# Patient Record
Sex: Female | Born: 1940 | Race: Black or African American | Hispanic: No | Marital: Married | Smoking: Current every day smoker
Health system: Southern US, Community
[De-identification: ages and names within clinical notes are randomized; demographics above are authoritative.]

## PROBLEM LIST (undated history)

## (undated) DIAGNOSIS — I2510 Atherosclerotic heart disease of native coronary artery without angina pectoris: Secondary | ICD-10-CM

## (undated) DIAGNOSIS — F32A Depression, unspecified: Secondary | ICD-10-CM

## (undated) DIAGNOSIS — F1010 Alcohol abuse, uncomplicated: Secondary | ICD-10-CM

## (undated) DIAGNOSIS — I219 Acute myocardial infarction, unspecified: Secondary | ICD-10-CM

## (undated) DIAGNOSIS — M1990 Unspecified osteoarthritis, unspecified site: Secondary | ICD-10-CM

## (undated) DIAGNOSIS — Z9289 Personal history of other medical treatment: Secondary | ICD-10-CM

## (undated) DIAGNOSIS — I469 Cardiac arrest, cause unspecified: Secondary | ICD-10-CM

## (undated) DIAGNOSIS — E785 Hyperlipidemia, unspecified: Secondary | ICD-10-CM

## (undated) DIAGNOSIS — F329 Major depressive disorder, single episode, unspecified: Secondary | ICD-10-CM

## (undated) DIAGNOSIS — I4901 Ventricular fibrillation: Secondary | ICD-10-CM

## (undated) DIAGNOSIS — Z720 Tobacco use: Secondary | ICD-10-CM

## (undated) DIAGNOSIS — I10 Essential (primary) hypertension: Secondary | ICD-10-CM

## (undated) DIAGNOSIS — F419 Anxiety disorder, unspecified: Secondary | ICD-10-CM

## (undated) DIAGNOSIS — I480 Paroxysmal atrial fibrillation: Secondary | ICD-10-CM

## (undated) DIAGNOSIS — G253 Myoclonus: Secondary | ICD-10-CM

## (undated) DIAGNOSIS — E039 Hypothyroidism, unspecified: Secondary | ICD-10-CM

## (undated) DIAGNOSIS — J9690 Respiratory failure, unspecified, unspecified whether with hypoxia or hypercapnia: Secondary | ICD-10-CM

## (undated) DIAGNOSIS — J449 Chronic obstructive pulmonary disease, unspecified: Secondary | ICD-10-CM

## (undated) DIAGNOSIS — G931 Anoxic brain damage, not elsewhere classified: Secondary | ICD-10-CM

## (undated) DIAGNOSIS — D649 Anemia, unspecified: Secondary | ICD-10-CM

## (undated) HISTORY — DX: Respiratory failure, unspecified, unspecified whether with hypoxia or hypercapnia: J96.90

## (undated) HISTORY — DX: Anxiety disorder, unspecified: F41.9

## (undated) HISTORY — DX: Major depressive disorder, single episode, unspecified: F32.9

## (undated) HISTORY — PX: ANKLE FRACTURE SURGERY: SHX122

## (undated) HISTORY — DX: Myoclonus: G25.3

## (undated) HISTORY — DX: Alcohol abuse, uncomplicated: F10.10

## (undated) HISTORY — DX: Anoxic brain damage, not elsewhere classified: G93.1

## (undated) HISTORY — DX: Unspecified osteoarthritis, unspecified site: M19.90

## (undated) HISTORY — DX: Hypothyroidism, unspecified: E03.9

## (undated) HISTORY — DX: Depression, unspecified: F32.A

## (undated) HISTORY — DX: Chronic obstructive pulmonary disease, unspecified: J44.9

## (undated) HISTORY — DX: Atherosclerotic heart disease of native coronary artery without angina pectoris: I25.10

## (undated) HISTORY — DX: Personal history of other medical treatment: Z92.89

## (undated) HISTORY — DX: Essential (primary) hypertension: I10

## (undated) HISTORY — DX: Anemia, unspecified: D64.9

## (undated) HISTORY — DX: Hyperlipidemia, unspecified: E78.5

## (undated) HISTORY — DX: Tobacco use: Z72.0

## (undated) HISTORY — DX: Acute myocardial infarction, unspecified: I21.9

---

## 1995-02-03 ENCOUNTER — Encounter (INDEPENDENT_AMBULATORY_CARE_PROVIDER_SITE_OTHER): Payer: Self-pay | Admitting: Internal Medicine

## 1995-02-03 LAB — CONVERTED CEMR LAB
Pap Smear: NORMAL
Pap Smear: NORMAL

## 1997-12-03 ENCOUNTER — Encounter (INDEPENDENT_AMBULATORY_CARE_PROVIDER_SITE_OTHER): Payer: Self-pay | Admitting: Internal Medicine

## 1997-12-03 LAB — CONVERTED CEMR LAB
Pap Smear: ABNORMAL
Pap Smear: ABNORMAL

## 1998-06-04 ENCOUNTER — Encounter (INDEPENDENT_AMBULATORY_CARE_PROVIDER_SITE_OTHER): Payer: Self-pay | Admitting: Internal Medicine

## 1998-06-04 LAB — CONVERTED CEMR LAB
Pap Smear: NORMAL
Pap Smear: NORMAL

## 1999-07-06 DIAGNOSIS — Z9289 Personal history of other medical treatment: Secondary | ICD-10-CM

## 1999-07-06 HISTORY — DX: Personal history of other medical treatment: Z92.89

## 1999-07-27 ENCOUNTER — Encounter: Payer: Self-pay | Admitting: Family Medicine

## 1999-07-27 ENCOUNTER — Encounter: Admission: RE | Admit: 1999-07-27 | Discharge: 1999-07-27 | Payer: Self-pay | Admitting: Family Medicine

## 1999-08-02 ENCOUNTER — Other Ambulatory Visit: Admission: RE | Admit: 1999-08-02 | Discharge: 1999-08-02 | Payer: Self-pay | Admitting: *Deleted

## 1999-08-05 HISTORY — PX: OTHER SURGICAL HISTORY: SHX169

## 2000-07-05 ENCOUNTER — Encounter: Admission: RE | Admit: 2000-07-05 | Discharge: 2000-07-05 | Payer: Self-pay | Admitting: Family Medicine

## 2000-07-05 ENCOUNTER — Encounter: Payer: Self-pay | Admitting: Family Medicine

## 2000-08-04 ENCOUNTER — Encounter (INDEPENDENT_AMBULATORY_CARE_PROVIDER_SITE_OTHER): Payer: Self-pay | Admitting: Internal Medicine

## 2000-08-04 LAB — CONVERTED CEMR LAB: Hgb A1c MFr Bld: 6 %

## 2000-09-03 ENCOUNTER — Other Ambulatory Visit: Admission: RE | Admit: 2000-09-03 | Discharge: 2000-09-03 | Payer: Self-pay | Admitting: Family Medicine

## 2000-09-04 ENCOUNTER — Encounter (INDEPENDENT_AMBULATORY_CARE_PROVIDER_SITE_OTHER): Payer: Self-pay | Admitting: Internal Medicine

## 2000-09-04 LAB — CONVERTED CEMR LAB: Pap Smear: NORMAL

## 2000-09-05 ENCOUNTER — Encounter (INDEPENDENT_AMBULATORY_CARE_PROVIDER_SITE_OTHER): Payer: Self-pay | Admitting: Internal Medicine

## 2000-09-05 LAB — CONVERTED CEMR LAB: Pap Smear: NORMAL

## 2002-02-02 ENCOUNTER — Encounter (INDEPENDENT_AMBULATORY_CARE_PROVIDER_SITE_OTHER): Payer: Self-pay | Admitting: Internal Medicine

## 2002-02-02 LAB — CONVERTED CEMR LAB
Hgb A1c MFr Bld: 6.9 %
Microalbumin U total vol: 1.7 mg/L

## 2002-06-04 ENCOUNTER — Encounter (INDEPENDENT_AMBULATORY_CARE_PROVIDER_SITE_OTHER): Payer: Self-pay | Admitting: Internal Medicine

## 2002-06-04 LAB — CONVERTED CEMR LAB: Hgb A1c MFr Bld: 7.3 %

## 2002-09-04 ENCOUNTER — Encounter (INDEPENDENT_AMBULATORY_CARE_PROVIDER_SITE_OTHER): Payer: Self-pay | Admitting: Internal Medicine

## 2002-09-04 LAB — CONVERTED CEMR LAB: Hgb A1c MFr Bld: 7.8 %

## 2002-11-03 ENCOUNTER — Encounter (INDEPENDENT_AMBULATORY_CARE_PROVIDER_SITE_OTHER): Payer: Self-pay | Admitting: Internal Medicine

## 2002-11-03 LAB — CONVERTED CEMR LAB: Hgb A1c MFr Bld: 7.2 %

## 2003-04-05 ENCOUNTER — Encounter (INDEPENDENT_AMBULATORY_CARE_PROVIDER_SITE_OTHER): Payer: Self-pay | Admitting: Internal Medicine

## 2003-04-05 LAB — CONVERTED CEMR LAB: Hgb A1c MFr Bld: 6.7 %

## 2004-05-05 ENCOUNTER — Encounter (INDEPENDENT_AMBULATORY_CARE_PROVIDER_SITE_OTHER): Payer: Self-pay | Admitting: Internal Medicine

## 2004-05-05 LAB — CONVERTED CEMR LAB
Hgb A1c MFr Bld: 7.1 %
Microalbumin U total vol: 1.2 mg/L

## 2004-07-06 ENCOUNTER — Ambulatory Visit: Payer: Self-pay | Admitting: Internal Medicine

## 2004-08-04 ENCOUNTER — Encounter (INDEPENDENT_AMBULATORY_CARE_PROVIDER_SITE_OTHER): Payer: Self-pay | Admitting: Internal Medicine

## 2004-08-04 LAB — CONVERTED CEMR LAB: Hgb A1c MFr Bld: 6.9 %

## 2004-08-09 ENCOUNTER — Ambulatory Visit: Payer: Self-pay | Admitting: Family Medicine

## 2004-09-13 ENCOUNTER — Ambulatory Visit: Payer: Self-pay | Admitting: Family Medicine

## 2005-02-02 ENCOUNTER — Encounter (INDEPENDENT_AMBULATORY_CARE_PROVIDER_SITE_OTHER): Payer: Self-pay | Admitting: Internal Medicine

## 2005-02-02 LAB — CONVERTED CEMR LAB
Hgb A1c MFr Bld: 6.2 %
Microalbumin U total vol: 1.2 mg/L

## 2005-03-02 ENCOUNTER — Ambulatory Visit: Payer: Self-pay | Admitting: Family Medicine

## 2005-04-07 ENCOUNTER — Ambulatory Visit: Payer: Self-pay | Admitting: Family Medicine

## 2005-05-19 ENCOUNTER — Ambulatory Visit: Payer: Self-pay | Admitting: Family Medicine

## 2005-06-04 ENCOUNTER — Encounter (INDEPENDENT_AMBULATORY_CARE_PROVIDER_SITE_OTHER): Payer: Self-pay | Admitting: Internal Medicine

## 2005-06-04 LAB — CONVERTED CEMR LAB: Hgb A1c MFr Bld: 7.3 %

## 2005-06-15 ENCOUNTER — Ambulatory Visit: Payer: Self-pay | Admitting: Internal Medicine

## 2005-06-15 ENCOUNTER — Ambulatory Visit: Payer: Self-pay | Admitting: Cardiology

## 2005-06-15 ENCOUNTER — Encounter: Payer: Self-pay | Admitting: Cardiology

## 2005-06-15 ENCOUNTER — Inpatient Hospital Stay (HOSPITAL_COMMUNITY): Admission: EM | Admit: 2005-06-15 | Discharge: 2005-06-17 | Payer: Self-pay | Admitting: Emergency Medicine

## 2005-06-16 HISTORY — PX: CARDIAC CATHETERIZATION: SHX172

## 2005-06-20 ENCOUNTER — Ambulatory Visit: Payer: Self-pay | Admitting: Family Medicine

## 2005-06-26 ENCOUNTER — Ambulatory Visit: Payer: Self-pay | Admitting: Cardiology

## 2005-06-27 ENCOUNTER — Emergency Department (HOSPITAL_COMMUNITY): Admission: EM | Admit: 2005-06-27 | Discharge: 2005-06-27 | Payer: Self-pay | Admitting: Emergency Medicine

## 2005-06-28 ENCOUNTER — Ambulatory Visit: Payer: Self-pay | Admitting: Family Medicine

## 2005-07-24 ENCOUNTER — Ambulatory Visit: Payer: Self-pay | Admitting: Family Medicine

## 2005-07-27 ENCOUNTER — Emergency Department (HOSPITAL_COMMUNITY): Admission: EM | Admit: 2005-07-27 | Discharge: 2005-07-27 | Payer: Self-pay | Admitting: Emergency Medicine

## 2005-09-16 ENCOUNTER — Ambulatory Visit: Payer: Self-pay | Admitting: Cardiology

## 2005-09-17 ENCOUNTER — Inpatient Hospital Stay (HOSPITAL_COMMUNITY): Admission: EM | Admit: 2005-09-17 | Discharge: 2005-09-18 | Payer: Self-pay | Admitting: Emergency Medicine

## 2005-09-17 ENCOUNTER — Ambulatory Visit: Payer: Self-pay | Admitting: Internal Medicine

## 2005-10-13 ENCOUNTER — Emergency Department (HOSPITAL_COMMUNITY): Admission: EM | Admit: 2005-10-13 | Discharge: 2005-10-13 | Payer: Self-pay | Admitting: Emergency Medicine

## 2005-10-19 ENCOUNTER — Ambulatory Visit: Payer: Self-pay | Admitting: Cardiology

## 2005-10-24 ENCOUNTER — Ambulatory Visit: Payer: Self-pay | Admitting: Family Medicine

## 2005-11-15 ENCOUNTER — Ambulatory Visit: Payer: Self-pay | Admitting: Family Medicine

## 2005-12-03 ENCOUNTER — Encounter (INDEPENDENT_AMBULATORY_CARE_PROVIDER_SITE_OTHER): Payer: Self-pay | Admitting: Internal Medicine

## 2005-12-03 LAB — CONVERTED CEMR LAB: Hgb A1c MFr Bld: 6.4 %

## 2005-12-06 ENCOUNTER — Encounter (INDEPENDENT_AMBULATORY_CARE_PROVIDER_SITE_OTHER): Payer: Self-pay | Admitting: Internal Medicine

## 2005-12-06 ENCOUNTER — Ambulatory Visit: Payer: Self-pay | Admitting: Family Medicine

## 2005-12-06 LAB — CONVERTED CEMR LAB: Hgb A1c MFr Bld: 6.4 %

## 2005-12-13 ENCOUNTER — Ambulatory Visit: Payer: Self-pay | Admitting: Family Medicine

## 2006-01-12 ENCOUNTER — Ambulatory Visit: Payer: Self-pay | Admitting: Family Medicine

## 2006-01-22 ENCOUNTER — Inpatient Hospital Stay (HOSPITAL_COMMUNITY): Admission: EM | Admit: 2006-01-22 | Discharge: 2006-01-23 | Payer: Self-pay | Admitting: Emergency Medicine

## 2006-01-22 ENCOUNTER — Ambulatory Visit: Payer: Self-pay | Admitting: Cardiology

## 2006-02-02 ENCOUNTER — Ambulatory Visit: Payer: Self-pay | Admitting: Family Medicine

## 2006-02-06 ENCOUNTER — Ambulatory Visit: Payer: Self-pay | Admitting: Family Medicine

## 2006-02-21 ENCOUNTER — Ambulatory Visit: Payer: Self-pay | Admitting: Family Medicine

## 2006-02-22 ENCOUNTER — Emergency Department (HOSPITAL_COMMUNITY): Admission: EM | Admit: 2006-02-22 | Discharge: 2006-02-23 | Payer: Self-pay | Admitting: *Deleted

## 2006-02-23 ENCOUNTER — Ambulatory Visit: Payer: Self-pay | Admitting: *Deleted

## 2006-03-14 ENCOUNTER — Ambulatory Visit (HOSPITAL_BASED_OUTPATIENT_CLINIC_OR_DEPARTMENT_OTHER): Admission: RE | Admit: 2006-03-14 | Discharge: 2006-03-14 | Payer: Self-pay | Admitting: Surgery

## 2006-03-16 ENCOUNTER — Ambulatory Visit: Payer: Self-pay | Admitting: Cardiology

## 2006-03-19 ENCOUNTER — Ambulatory Visit: Payer: Self-pay | Admitting: Family Medicine

## 2006-03-21 ENCOUNTER — Ambulatory Visit: Payer: Self-pay | Admitting: Family Medicine

## 2006-04-02 ENCOUNTER — Ambulatory Visit (HOSPITAL_BASED_OUTPATIENT_CLINIC_OR_DEPARTMENT_OTHER): Admission: RE | Admit: 2006-04-02 | Discharge: 2006-04-02 | Payer: Self-pay | Admitting: Surgery

## 2006-04-02 ENCOUNTER — Encounter (INDEPENDENT_AMBULATORY_CARE_PROVIDER_SITE_OTHER): Payer: Self-pay | Admitting: *Deleted

## 2006-05-21 ENCOUNTER — Ambulatory Visit: Payer: Self-pay | Admitting: Family Medicine

## 2006-06-04 ENCOUNTER — Encounter (INDEPENDENT_AMBULATORY_CARE_PROVIDER_SITE_OTHER): Payer: Self-pay | Admitting: Internal Medicine

## 2006-06-04 LAB — CONVERTED CEMR LAB: Hgb A1c MFr Bld: 6.8 %

## 2006-06-20 ENCOUNTER — Ambulatory Visit: Payer: Self-pay | Admitting: General Practice

## 2006-06-25 ENCOUNTER — Encounter (INDEPENDENT_AMBULATORY_CARE_PROVIDER_SITE_OTHER): Payer: Self-pay | Admitting: Internal Medicine

## 2006-06-25 ENCOUNTER — Ambulatory Visit: Payer: Self-pay | Admitting: Family Medicine

## 2006-06-25 LAB — CONVERTED CEMR LAB: Hgb A1c MFr Bld: 6.8 %

## 2006-06-27 ENCOUNTER — Ambulatory Visit: Payer: Self-pay | Admitting: Family Medicine

## 2006-07-18 ENCOUNTER — Ambulatory Visit: Payer: Self-pay | Admitting: Family Medicine

## 2006-07-31 ENCOUNTER — Ambulatory Visit: Payer: Self-pay | Admitting: Family Medicine

## 2006-08-20 ENCOUNTER — Ambulatory Visit: Payer: Self-pay | Admitting: Family Medicine

## 2006-08-21 ENCOUNTER — Ambulatory Visit: Payer: Self-pay | Admitting: Cardiology

## 2006-09-04 ENCOUNTER — Encounter (INDEPENDENT_AMBULATORY_CARE_PROVIDER_SITE_OTHER): Payer: Self-pay | Admitting: Internal Medicine

## 2006-09-04 LAB — CONVERTED CEMR LAB: Hgb A1c MFr Bld: 6.5 %

## 2006-09-21 ENCOUNTER — Ambulatory Visit: Payer: Self-pay | Admitting: Family Medicine

## 2006-09-21 ENCOUNTER — Encounter (INDEPENDENT_AMBULATORY_CARE_PROVIDER_SITE_OTHER): Payer: Self-pay | Admitting: Internal Medicine

## 2006-09-21 LAB — CONVERTED CEMR LAB
ALT: 14 units/L (ref 0–40)
AST: 16 units/L (ref 0–37)
BUN: 10 mg/dL (ref 6–23)
CO2: 33 meq/L — ABNORMAL HIGH (ref 19–32)
Calcium: 9.8 mg/dL (ref 8.4–10.5)
Chloride: 100 meq/L (ref 96–112)
Cholesterol: 226 mg/dL (ref 0–200)
Creatinine, Ser: 0.8 mg/dL (ref 0.4–1.2)
Creatinine,U: 109.5 mg/dL
Direct LDL: 132 mg/dL
GFR calc Af Amer: 92 mL/min
GFR calc non Af Amer: 76 mL/min
Glucose, Bld: 98 mg/dL (ref 70–99)
HDL: 59 mg/dL (ref >39.0)
Hgb A1c MFr Bld: 6.5 %
Hgb A1c MFr Bld: 6.5 % — ABNORMAL HIGH (ref 4.6–6.0)
Microalb Creat Ratio: 491.3 mg/g — ABNORMAL HIGH (ref 0.0–30.0)
Microalb, Ur: 53.8 mg/dL (ref 0.0–1.9)
Microalbumin U total vol: 53.8 mg/L
Microalbumin U total vol: 53.8 mg/L
Potassium: 3.6 meq/L (ref 3.5–5.1)
Sodium: 140 meq/L (ref 135–145)
TSH: 0.99 microintl units/mL (ref 0.35–5.50)
Total CHOL/HDL Ratio: 3.8
Triglycerides: 131 mg/dL (ref 0–149)
VLDL: 26 mg/dL (ref 0–40)

## 2006-09-27 ENCOUNTER — Ambulatory Visit: Payer: Self-pay | Admitting: Family Medicine

## 2006-10-25 ENCOUNTER — Encounter: Admission: RE | Admit: 2006-10-25 | Discharge: 2006-10-25 | Payer: Self-pay | Admitting: Nephrology

## 2006-10-31 ENCOUNTER — Encounter: Admission: RE | Admit: 2006-10-31 | Discharge: 2006-10-31 | Payer: Self-pay | Admitting: Nephrology

## 2006-11-27 ENCOUNTER — Encounter (INDEPENDENT_AMBULATORY_CARE_PROVIDER_SITE_OTHER): Payer: Self-pay | Admitting: Internal Medicine

## 2006-11-27 DIAGNOSIS — J449 Chronic obstructive pulmonary disease, unspecified: Secondary | ICD-10-CM

## 2006-11-27 DIAGNOSIS — E785 Hyperlipidemia, unspecified: Secondary | ICD-10-CM

## 2006-11-27 DIAGNOSIS — E039 Hypothyroidism, unspecified: Secondary | ICD-10-CM

## 2006-11-27 DIAGNOSIS — M1990 Unspecified osteoarthritis, unspecified site: Secondary | ICD-10-CM | POA: Insufficient documentation

## 2006-11-27 DIAGNOSIS — J4489 Other specified chronic obstructive pulmonary disease: Secondary | ICD-10-CM | POA: Insufficient documentation

## 2007-01-09 ENCOUNTER — Ambulatory Visit: Payer: Self-pay | Admitting: Family Medicine

## 2007-01-09 DIAGNOSIS — F329 Major depressive disorder, single episode, unspecified: Secondary | ICD-10-CM

## 2007-01-09 DIAGNOSIS — I10 Essential (primary) hypertension: Secondary | ICD-10-CM

## 2007-01-09 DIAGNOSIS — F419 Anxiety disorder, unspecified: Secondary | ICD-10-CM | POA: Insufficient documentation

## 2007-01-09 DIAGNOSIS — E118 Type 2 diabetes mellitus with unspecified complications: Secondary | ICD-10-CM

## 2007-01-18 ENCOUNTER — Telehealth (INDEPENDENT_AMBULATORY_CARE_PROVIDER_SITE_OTHER): Payer: Self-pay | Admitting: Internal Medicine

## 2007-03-14 ENCOUNTER — Encounter: Payer: Self-pay | Admitting: Internal Medicine

## 2007-03-14 ENCOUNTER — Ambulatory Visit: Payer: Self-pay | Admitting: Internal Medicine

## 2007-03-14 LAB — CONVERTED CEMR LAB
ALT: 14 units/L (ref 0–35)
AST: 17 units/L (ref 0–37)
Albumin: 3.6 g/dL (ref 3.5–5.2)
Alkaline Phosphatase: 56 units/L (ref 39–117)
BUN: 10 mg/dL (ref 6–23)
Basophils Absolute: 0 10*3/uL (ref 0.0–0.1)
Basophils Relative: 0.3 % (ref 0.0–1.0)
Bilirubin, Direct: 0.2 mg/dL (ref 0.0–0.3)
CO2: 29 meq/L (ref 19–32)
Calcium: 9.6 mg/dL (ref 8.4–10.5)
Chloride: 98 meq/L (ref 96–112)
Cholesterol, target level: 200 mg/dL
Cholesterol: 239 mg/dL (ref 0–200)
Creatinine, Ser: 0.7 mg/dL (ref 0.4–1.2)
Direct LDL: 141.3 mg/dL
Eosinophils Absolute: 0.1 10*3/uL (ref 0.0–0.6)
Eosinophils Relative: 2.1 % (ref 0.0–5.0)
GFR calc Af Amer: 107 mL/min
GFR calc non Af Amer: 89 mL/min
Glucose, Bld: 90 mg/dL (ref 70–99)
HCT: 39.8 % (ref 36.0–46.0)
HDL goal, serum: 40 mg/dL
HDL: 65.3 mg/dL (ref >39.0)
Hemoglobin: 13.6 g/dL (ref 12.0–15.0)
Hgb A1c MFr Bld: 6.4 % — ABNORMAL HIGH (ref 4.6–6.0)
LDL Goal: 100 mg/dL
Lymphocytes Relative: 44 % (ref 12.0–46.0)
MCHC: 34.1 g/dL (ref 30.0–36.0)
MCV: 88.4 fL (ref 78.0–100.0)
Monocytes Absolute: 0.4 10*3/uL (ref 0.2–0.7)
Monocytes Relative: 7.4 % (ref 3.0–11.0)
Neutro Abs: 2.8 10*3/uL (ref 1.4–7.7)
Neutrophils Relative %: 46.2 % (ref 43.0–77.0)
Platelets: 207 10*3/uL (ref 150–400)
Potassium: 4.5 meq/L (ref 3.5–5.1)
RBC: 4.5 M/uL (ref 3.87–5.11)
RDW: 13.8 % (ref 11.5–14.6)
Sodium: 136 meq/L (ref 135–145)
TSH: 0.36 microintl units/mL (ref 0.35–5.50)
Total Bilirubin: 1 mg/dL (ref 0.3–1.2)
Total CHOL/HDL Ratio: 3.7
Total Protein: 6 g/dL (ref 6.0–8.3)
Triglycerides: 131 mg/dL (ref 0–149)
VLDL: 26 mg/dL (ref 0–40)
WBC: 5.9 10*3/uL (ref 4.5–10.5)

## 2007-04-26 ENCOUNTER — Ambulatory Visit: Payer: Self-pay | Admitting: Internal Medicine

## 2007-04-26 DIAGNOSIS — F17200 Nicotine dependence, unspecified, uncomplicated: Secondary | ICD-10-CM | POA: Insufficient documentation

## 2007-04-29 ENCOUNTER — Telehealth: Payer: Self-pay | Admitting: Internal Medicine

## 2007-05-13 ENCOUNTER — Encounter (INDEPENDENT_AMBULATORY_CARE_PROVIDER_SITE_OTHER): Payer: Self-pay | Admitting: *Deleted

## 2007-06-13 ENCOUNTER — Encounter: Payer: Self-pay | Admitting: Internal Medicine

## 2007-06-21 ENCOUNTER — Ambulatory Visit: Payer: Self-pay | Admitting: Internal Medicine

## 2007-06-21 DIAGNOSIS — F1010 Alcohol abuse, uncomplicated: Secondary | ICD-10-CM | POA: Insufficient documentation

## 2007-06-26 ENCOUNTER — Ambulatory Visit: Payer: Self-pay | Admitting: General Practice

## 2007-07-22 ENCOUNTER — Ambulatory Visit: Payer: Self-pay | Admitting: Internal Medicine

## 2007-07-22 LAB — CONVERTED CEMR LAB
ALT: 17 units/L (ref 0–35)
AST: 17 units/L (ref 0–37)
Albumin: 3.9 g/dL (ref 3.5–5.2)
Alkaline Phosphatase: 58 units/L (ref 39–117)
BUN: 14 mg/dL (ref 6–23)
Bilirubin, Direct: 0.2 mg/dL (ref 0.0–0.3)
CO2: 30 meq/L (ref 19–32)
Calcium: 10.1 mg/dL (ref 8.4–10.5)
Chloride: 99 meq/L (ref 96–112)
Creatinine, Ser: 0.9 mg/dL (ref 0.4–1.2)
GFR calc Af Amer: 80 mL/min
GFR calc non Af Amer: 66 mL/min
Glucose, Bld: 129 mg/dL — ABNORMAL HIGH (ref 70–99)
Hgb A1c MFr Bld: 6.4 % — ABNORMAL HIGH (ref 4.6–6.0)
Potassium: 3.8 meq/L (ref 3.5–5.1)
Sodium: 138 meq/L (ref 135–145)
TSH: 0.5 microintl units/mL (ref 0.35–5.50)
Total Bilirubin: 1.3 mg/dL — ABNORMAL HIGH (ref 0.3–1.2)
Total Protein: 6.6 g/dL (ref 6.0–8.3)

## 2007-07-26 ENCOUNTER — Ambulatory Visit: Payer: Self-pay | Admitting: Internal Medicine

## 2007-08-14 ENCOUNTER — Ambulatory Visit: Payer: Self-pay | Admitting: Internal Medicine

## 2007-09-25 ENCOUNTER — Encounter: Payer: Self-pay | Admitting: Internal Medicine

## 2007-09-25 ENCOUNTER — Ambulatory Visit: Payer: Self-pay | Admitting: Internal Medicine

## 2007-09-26 LAB — HM COLONOSCOPY

## 2007-12-09 ENCOUNTER — Ambulatory Visit: Payer: Self-pay | Admitting: Internal Medicine

## 2007-12-11 LAB — CONVERTED CEMR LAB
ALT: 15 units/L (ref 0–35)
AST: 19 units/L (ref 0–37)
BUN: 16 mg/dL (ref 6–23)
CO2: 29 meq/L (ref 19–32)
Calcium: 9.6 mg/dL (ref 8.4–10.5)
Chloride: 101 meq/L (ref 96–112)
Cholesterol: 229 mg/dL (ref 0–200)
Creatinine, Ser: 0.9 mg/dL (ref 0.4–1.2)
Direct LDL: 153.7 mg/dL
GFR calc Af Amer: 80 mL/min
GFR calc non Af Amer: 66 mL/min
Glucose, Bld: 163 mg/dL — ABNORMAL HIGH (ref 70–99)
HDL: 62.8 mg/dL (ref >39.0)
Hgb A1c MFr Bld: 6.3 % — ABNORMAL HIGH (ref 4.6–6.0)
Potassium: 3.6 meq/L (ref 3.5–5.1)
Sodium: 136 meq/L (ref 135–145)
TSH: 0.67 microintl units/mL (ref 0.35–5.50)
Total CHOL/HDL Ratio: 3.6
Triglycerides: 96 mg/dL (ref 0–149)
VLDL: 19 mg/dL (ref 0–40)

## 2007-12-17 ENCOUNTER — Ambulatory Visit: Payer: Self-pay | Admitting: Internal Medicine

## 2008-01-31 ENCOUNTER — Ambulatory Visit: Payer: Self-pay | Admitting: Internal Medicine

## 2008-02-03 ENCOUNTER — Telehealth (INDEPENDENT_AMBULATORY_CARE_PROVIDER_SITE_OTHER): Payer: Self-pay | Admitting: *Deleted

## 2008-02-03 ENCOUNTER — Encounter: Payer: Self-pay | Admitting: Internal Medicine

## 2008-03-10 ENCOUNTER — Ambulatory Visit: Payer: Self-pay | Admitting: Internal Medicine

## 2008-03-10 LAB — CONVERTED CEMR LAB
ALT: 22 units/L (ref 0–35)
AST: 23 units/L (ref 0–37)
Albumin: 3.7 g/dL (ref 3.5–5.2)
Alkaline Phosphatase: 57 units/L (ref 39–117)
BUN: 15 mg/dL (ref 6–23)
Bilirubin, Direct: 0.1 mg/dL (ref 0.0–0.3)
CO2: 27 meq/L (ref 19–32)
Calcium: 9.6 mg/dL (ref 8.4–10.5)
Chloride: 98 meq/L (ref 96–112)
Cholesterol: 173 mg/dL (ref 0–200)
Creatinine, Ser: 0.9 mg/dL (ref 0.4–1.2)
GFR calc Af Amer: 80 mL/min
GFR calc non Af Amer: 66 mL/min
Glucose, Bld: 125 mg/dL — ABNORMAL HIGH (ref 70–99)
HDL: 63.6 mg/dL (ref >39.0)
Hgb A1c MFr Bld: 6.6 % — ABNORMAL HIGH (ref 4.6–6.0)
LDL Cholesterol: 81 mg/dL (ref 0–99)
Potassium: 3.6 meq/L (ref 3.5–5.1)
Sodium: 134 meq/L — ABNORMAL LOW (ref 135–145)
TSH: 1.01 microintl units/mL (ref 0.35–5.50)
Total Bilirubin: 0.8 mg/dL (ref 0.3–1.2)
Total CHOL/HDL Ratio: 2.7
Total Protein: 6.7 g/dL (ref 6.0–8.3)
Triglycerides: 144 mg/dL (ref 0–149)
VLDL: 29 mg/dL (ref 0–40)

## 2008-03-17 ENCOUNTER — Ambulatory Visit: Payer: Self-pay | Admitting: Internal Medicine

## 2008-03-31 ENCOUNTER — Encounter: Payer: Self-pay | Admitting: Internal Medicine

## 2008-05-29 ENCOUNTER — Encounter: Payer: Self-pay | Admitting: Internal Medicine

## 2008-06-27 LAB — HM MAMMOGRAPHY

## 2008-08-03 ENCOUNTER — Ambulatory Visit: Payer: Self-pay | Admitting: Internal Medicine

## 2008-08-03 LAB — CONVERTED CEMR LAB
ALT: 23 units/L (ref 0–35)
AST: 20 units/L (ref 0–37)
Albumin: 3.5 g/dL (ref 3.5–5.2)
Alkaline Phosphatase: 52 units/L (ref 39–117)
BUN: 15 mg/dL (ref 6–23)
Bilirubin, Direct: 0.1 mg/dL (ref 0.0–0.3)
CO2: 31 meq/L (ref 19–32)
Calcium: 9.6 mg/dL (ref 8.4–10.5)
Chloride: 106 meq/L (ref 96–112)
Cholesterol: 159 mg/dL (ref 0–200)
Creatinine, Ser: 0.7 mg/dL (ref 0.4–1.2)
GFR calc Af Amer: 107 mL/min
GFR calc non Af Amer: 88 mL/min
Glucose, Bld: 97 mg/dL (ref 70–99)
HDL: 61.7 mg/dL (ref >39.0)
Hgb A1c MFr Bld: 6.7 % — ABNORMAL HIGH (ref 4.6–6.0)
LDL Cholesterol: 67 mg/dL (ref 0–99)
Potassium: 3.6 meq/L (ref 3.5–5.1)
Sodium: 142 meq/L (ref 135–145)
Total Bilirubin: 0.7 mg/dL (ref 0.3–1.2)
Total CHOL/HDL Ratio: 2.6
Total Protein: 6.4 g/dL (ref 6.0–8.3)
Triglycerides: 152 mg/dL — ABNORMAL HIGH (ref 0–149)
VLDL: 30 mg/dL (ref 0–40)

## 2008-08-06 ENCOUNTER — Ambulatory Visit: Payer: Self-pay | Admitting: Internal Medicine

## 2008-12-10 ENCOUNTER — Ambulatory Visit: Payer: Self-pay | Admitting: Internal Medicine

## 2008-12-10 LAB — CONVERTED CEMR LAB
ALT: 16 units/L (ref 0–35)
AST: 19 units/L (ref 0–37)
Albumin: 3.4 g/dL — ABNORMAL LOW (ref 3.5–5.2)
Calcium: 9.3 mg/dL (ref 8.4–10.5)
GFR calc non Af Amer: 91.28 mL/min (ref >60)
Glucose, Bld: 124 mg/dL — ABNORMAL HIGH (ref 70–99)
HDL: 56.8 mg/dL (ref >39.00)
Sodium: 139 meq/L (ref 135–145)
Triglycerides: 88 mg/dL (ref 0.0–149.0)

## 2008-12-30 ENCOUNTER — Encounter: Payer: Self-pay | Admitting: Internal Medicine

## 2009-01-05 ENCOUNTER — Ambulatory Visit: Payer: Self-pay | Admitting: Internal Medicine

## 2009-01-14 ENCOUNTER — Ambulatory Visit: Payer: Self-pay | Admitting: Cardiology

## 2009-03-12 ENCOUNTER — Ambulatory Visit: Payer: Self-pay | Admitting: Cardiology

## 2009-03-12 ENCOUNTER — Inpatient Hospital Stay (HOSPITAL_COMMUNITY): Admission: EM | Admit: 2009-03-12 | Discharge: 2009-03-14 | Payer: Self-pay | Admitting: Emergency Medicine

## 2009-03-22 ENCOUNTER — Ambulatory Visit: Payer: Self-pay | Admitting: Cardiology

## 2009-03-24 LAB — CONVERTED CEMR LAB
BUN: 19 mg/dL (ref 6–23)
Calcium: 9.5 mg/dL (ref 8.4–10.5)
Potassium: 3.7 meq/L (ref 3.5–5.1)
Sodium: 139 meq/L (ref 135–145)

## 2009-04-02 ENCOUNTER — Ambulatory Visit: Payer: Self-pay | Admitting: Cardiology

## 2009-04-02 ENCOUNTER — Encounter: Payer: Self-pay | Admitting: Nurse Practitioner

## 2009-04-02 DIAGNOSIS — I4819 Other persistent atrial fibrillation: Secondary | ICD-10-CM

## 2009-05-06 ENCOUNTER — Ambulatory Visit: Payer: Self-pay | Admitting: Internal Medicine

## 2009-05-12 LAB — CONVERTED CEMR LAB
CO2: 30 meq/L (ref 19–32)
Calcium: 9.7 mg/dL (ref 8.4–10.5)
Cholesterol: 166 mg/dL (ref 0–200)
HDL: 63.1 mg/dL (ref >39.00)
Hgb A1c MFr Bld: 7 % — ABNORMAL HIGH (ref 4.6–6.5)
Sodium: 139 meq/L (ref 135–145)
Total CHOL/HDL Ratio: 3
Triglycerides: 90 mg/dL (ref 0.0–149.0)

## 2009-06-09 ENCOUNTER — Ambulatory Visit: Payer: Self-pay | Admitting: Cardiology

## 2009-09-17 ENCOUNTER — Ambulatory Visit: Payer: Self-pay | Admitting: Internal Medicine

## 2009-09-17 LAB — CONVERTED CEMR LAB
BUN: 20 mg/dL (ref 6–23)
Bilirubin, Direct: 0.1 mg/dL (ref 0.0–0.3)
CO2: 19 meq/L (ref 19–32)
Chloride: 100 meq/L (ref 96–112)
Cholesterol: 185 mg/dL (ref 0–200)
Glucose, Bld: 146 mg/dL — ABNORMAL HIGH (ref 70–99)
Hgb A1c MFr Bld: 7.3 % — ABNORMAL HIGH (ref 4.6–6.5)
Indirect Bilirubin: 0.5 mg/dL (ref 0.0–0.9)
LDL Cholesterol: 97 mg/dL (ref 0–99)
Potassium: 3.8 meq/L (ref 3.5–5.3)
Total Bilirubin: 0.6 mg/dL (ref 0.3–1.2)
Total CHOL/HDL Ratio: 2.9
VLDL: 24 mg/dL (ref 0–40)

## 2009-09-18 ENCOUNTER — Encounter: Payer: Self-pay | Admitting: Cardiology

## 2009-09-20 ENCOUNTER — Telehealth: Payer: Self-pay | Admitting: Internal Medicine

## 2009-09-23 ENCOUNTER — Ambulatory Visit: Payer: Self-pay | Admitting: Internal Medicine

## 2009-12-22 ENCOUNTER — Encounter: Payer: Self-pay | Admitting: Internal Medicine

## 2010-01-19 ENCOUNTER — Ambulatory Visit: Payer: Self-pay | Admitting: Internal Medicine

## 2010-01-19 LAB — CONVERTED CEMR LAB
Albumin: 4 g/dL (ref 3.5–5.2)
CO2: 28 meq/L (ref 19–32)
Calcium: 9.8 mg/dL (ref 8.4–10.5)
Chloride: 102 meq/L (ref 96–112)
Cholesterol: 250 mg/dL — ABNORMAL HIGH (ref 0–200)
Creatinine, Ser: 0.9 mg/dL (ref 0.4–1.2)
Direct LDL: 157.6 mg/dL
HDL: 74.9 mg/dL (ref >39.00)
Hgb A1c MFr Bld: 7.4 % — ABNORMAL HIGH (ref 4.6–6.5)
Sodium: 141 meq/L (ref 135–145)
Total CHOL/HDL Ratio: 3
Total Protein: 6.8 g/dL (ref 6.0–8.3)
Triglycerides: 128 mg/dL (ref 0.0–149.0)
VLDL: 25.6 mg/dL (ref 0.0–40.0)

## 2010-01-25 ENCOUNTER — Ambulatory Visit: Payer: Self-pay | Admitting: Internal Medicine

## 2010-03-11 ENCOUNTER — Ambulatory Visit: Payer: Self-pay | Admitting: Cardiology

## 2010-05-17 ENCOUNTER — Ambulatory Visit: Payer: Self-pay | Admitting: Internal Medicine

## 2010-05-17 LAB — CONVERTED CEMR LAB
ALT: 18 units/L (ref 0–35)
AST: 18 units/L (ref 0–37)
Albumin: 3.7 g/dL (ref 3.5–5.2)
BUN: 15 mg/dL (ref 6–23)
Chloride: 104 meq/L (ref 96–112)
Cholesterol: 217 mg/dL — ABNORMAL HIGH (ref 0–200)
Creatinine, Ser: 0.8 mg/dL (ref 0.4–1.2)
Glucose, Bld: 113 mg/dL — ABNORMAL HIGH (ref 70–99)
HDL: 65.6 mg/dL (ref >39.00)
Hgb A1c MFr Bld: 7.3 % — ABNORMAL HIGH (ref 4.6–6.5)
Potassium: 4 meq/L (ref 3.5–5.1)
TSH: 0.48 microintl units/mL (ref 0.35–5.50)
Total Bilirubin: 0.5 mg/dL (ref 0.3–1.2)
VLDL: 13.4 mg/dL (ref 0.0–40.0)

## 2010-05-26 ENCOUNTER — Ambulatory Visit: Payer: Self-pay | Admitting: Internal Medicine

## 2010-09-04 LAB — HM DIABETES EYE EXAM: HM Diabetic Eye Exam: NORMAL

## 2010-09-21 ENCOUNTER — Ambulatory Visit
Admission: RE | Admit: 2010-09-21 | Discharge: 2010-09-21 | Payer: Self-pay | Source: Home / Self Care | Attending: Internal Medicine | Admitting: Internal Medicine

## 2010-09-21 ENCOUNTER — Other Ambulatory Visit: Payer: Self-pay | Admitting: Internal Medicine

## 2010-09-21 LAB — BASIC METABOLIC PANEL
BUN: 12 mg/dL (ref 6–23)
CO2: 26 mEq/L (ref 19–32)
Calcium: 9.5 mg/dL (ref 8.4–10.5)
Chloride: 103 mEq/L (ref 96–112)
Creatinine, Ser: 0.7 mg/dL (ref 0.4–1.2)
GFR: 102.55 mL/min (ref >60.00)
Glucose, Bld: 125 mg/dL — ABNORMAL HIGH (ref 70–99)
Potassium: 3.9 mEq/L (ref 3.5–5.1)
Sodium: 138 mEq/L (ref 135–145)

## 2010-09-21 LAB — HEMOGLOBIN A1C: Hgb A1c MFr Bld: 7.1 % — ABNORMAL HIGH (ref 4.6–6.5)

## 2010-09-21 LAB — LIPID PANEL
Cholesterol: 274 mg/dL — ABNORMAL HIGH (ref 0–200)
HDL: 75.1 mg/dL (ref >39.00)
Total CHOL/HDL Ratio: 4
Triglycerides: 115 mg/dL (ref 0.0–149.0)
VLDL: 23 mg/dL (ref 0.0–40.0)

## 2010-09-21 LAB — LDL CHOLESTEROL, DIRECT: Direct LDL: 185.8 mg/dL

## 2010-09-21 LAB — TSH: TSH: 0.43 u[IU]/mL (ref 0.35–5.50)

## 2010-09-21 LAB — HEPATIC FUNCTION PANEL
ALT: 16 U/L (ref 0–35)
AST: 15 U/L (ref 0–37)
Albumin: 3.6 g/dL (ref 3.5–5.2)
Alkaline Phosphatase: 73 U/L (ref 39–117)
Bilirubin, Direct: 0.1 mg/dL (ref 0.0–0.3)
Total Bilirubin: 0.9 mg/dL (ref 0.3–1.2)
Total Protein: 6.2 g/dL (ref 6.0–8.3)

## 2010-09-26 ENCOUNTER — Ambulatory Visit: Admit: 2010-09-26 | Payer: Self-pay | Admitting: Internal Medicine

## 2010-09-27 ENCOUNTER — Encounter: Payer: Self-pay | Admitting: Internal Medicine

## 2010-10-03 ENCOUNTER — Ambulatory Visit
Admission: RE | Admit: 2010-10-03 | Discharge: 2010-10-03 | Payer: Self-pay | Source: Home / Self Care | Attending: Internal Medicine | Admitting: Internal Medicine

## 2010-10-04 NOTE — Assessment & Plan Note (Signed)
Summary: ROA/FUP/RCD rsc bmp/njr---PT RSC (BMP) // RS   Vital Signs:  Patient profile:   70 year old female Weight:      142.5 pounds BMI:     23.80 Temp:     98.1 degrees F Pulse rate:   56 / minute Resp:     12 per minute BP sitting:   160 / 98  (left arm)  Vitals Entered By: Gladis Riffle, RN (September 23, 2009 8:06 AM)   Primary Care Provider:  Birdie Sons MD   History of Present Illness:  Follow-Up Visit      This is a 70 year old woman who presents for Follow-up visit.  The patient denies chest pain, palpitations, dizziness, syncope, edema, SOB, DOE, PND, and orthopnea.  Since the last visit the patient notes no new problems or concerns.  The patient reports taking meds as prescribed.  When questioned about possible medication side effects, the patient notes none.    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 1& 1/2 Tab Qd 2)  Synthroid 75 Mcg Tabs (Levothyroxine Sodium) .Marland Kitchen.. 1 Qd 3)  Aspirin 81 Mg Tbec (Aspirin) .Marland Kitchen.. 1 To 2 Qd 4)  Actoplus Met 15-850 Mg Tabs (Pioglitazone Hcl-Metformin Hcl) .Marland Kitchen.. 1 Once Daily 5)   Ramipril 10 Mg Caps (Ramipril) .... Take 1 Capsule By Mouth Once A Day 6)  Triamterene-Hctz 37.5-25 Mg  Tabs (Triamterene-Hctz) .... One By Mouth Daily 7)  Cartia Xt 300 Mg  Cp24 (Diltiazem Hcl Coated Beads) .... One By Mouth Daily 8)  Crestor 20 Mg Tabs (Rosuvastatin Calcium) .... Out 9)  Flecainide Acetate 100 Mg Tabs (Flecainide Acetate) .... Take Tree Tablets By Mouth As Needed For Palpitations  Allergies: 1)  * Penicillin  Comments:  Nurse/Medical Assistant: rov, labs done--continues to smoke and not using chantix--needs synthroid Rx  The patient's medications and allergies were reviewed with the patient and were updated in the Medication and Allergy Lists. Gladis Riffle, RN (September 23, 2009 8:07 AM)  Social History: Packs/Day:  0.5  Review of Systems       All other systems reviewed and were negative   Physical Exam  General:  Well developed, well nourished, in no acute distress.  smells of tobacco Head:  normocephalic and atraumatic.   Eyes:  pupils equal and pupils round.   Ears:  R ear normal and L ear normal.   Neck:  supple without bruits or JVD. Chest Wall:  no deformities or breast masses noted Lungs:  Clear bilaterally to auscultation and percussion. Abdomen:  round, soft, nontender,  nondistended, bowel sounds present x4. Msk:  No deformity or scoliosis noted of thoracic or lumbar spine.   Pulses:  R radial normal and L radial normal.   Extremities:  No clubbing, cyanosis, edema, or deformity noted  Neurologic:  cranial nerves II-XII intact and gait normal.   Skin:  turgor normal and color normal.   Psych:  memory intact for recent and remote and good eye contact.     Impression & Recommendations:  Problem # 1:  ATRIAL FIBRILLATION, PAROXYSMAL (ICD-427.31) no recurrence continue current medications  Her updated medication list for this problem includes:    Metoprolol Tartrate 50 Mg Tabs (Metoprolol tartrate) .Marland Kitchen... 1& 1/2 tab qd    Aspirin 81 Mg Tbec  (Aspirin) .Marland Kitchen... 1 to 2 qd    Cartia Xt 300 Mg Cp24 (Diltiazem hcl coated beads) ..... One by mouth daily    Flecainide Acetate 100 Mg Tabs (Flecainide acetate) .Marland Kitchen... Take tree tablets by mouth as needed for palpitations  Problem # 2:  TOBACCO USE (ICD-305.1) she is trying to taper---discussed importance of absolute cessation  Problem # 3:  DIABETES-TYPE 2 (ICD-250.00) fair control discussed ongoing use of TZD Her updated medication list for this problem includes:    Aspirin 81 Mg Tbec (Aspirin) .Marland Kitchen... 1 to 2 qd    Actoplus Met 15-850 Mg Tabs (Pioglitazone hcl-metformin hcl) .Marland Kitchen... 1 once daily    Ramipril 10 Mg Caps (Ramipril) .Marland Kitchen... Take 1 capsule by mouth once a day  Labs Reviewed: Creat: 0.75 (09/17/2009)   Microalbumin: 53.8 (09/21/2006)  Last Eye Exam: normal-pt's report (02/02/2009) Reviewed HgBA1c results: 7.3 (09/17/2009)  7.0 (05/06/2009)  Problem # 4:  HYPOTHYROIDISM (ICD-244.9) continue current medications (she has been out of repla cement for one month---she will resume) Her updated medication list for this problem includes:    Synthroid 75 Mcg Tabs (Levothyroxine sodium) .Marland Kitchen... 1 qd  Labs Reviewed: TSH: 0.44 (05/06/2009)    HgBA1c: 7.3 (09/17/2009) Chol: 185 (09/17/2009)   HDL: 64 (09/17/2009)   LDL: 97 (09/17/2009)   TG: 121 (09/17/2009)  Problem # 5:  HYPERLIPIDEMIA (ICD-272.4) well controleed continue current medications  Her updated medication list for this problem includes:    Crestor 20 Mg Tabs (Rosuvastatin calcium) ..... Out  Labs Reviewed: SGOT: 13 (09/17/2009)   SGPT: 12 (09/17/2009)  Lipid Goals: Chol Goal: 200 (03/14/2007)   HDL Goal: 40 (03/14/2007)   LDL Goal: 100 (03/14/2007)   TG Goal: 150 (03/14/2007)  Prior 10 Yr Risk Heart Disease: Not enough information (03/14/2007)   HDL:64 (09/17/2009), 63.10 (05/06/2009)  LDL:97 (09/17/2009), 85 (05/06/2009)  Chol:185 (09/17/2009), 166 (05/06/2009)  Trig:121 (09/17/2009), 90.0 (05/06/2009)  Complete  Medication List: 1)  Metoprolol Tartrate 50 Mg Tabs (Metoprolol tartrate) .Marland Kitchen.. 1& 1/2 tab qd 2)  Synthroid 75 Mcg Tabs (Levothyroxine sodium) .Marland Kitchen.. 1 qd 3)  Aspirin 81 Mg Tbec (Aspirin) .Marland Kitchen.. 1 to 2 qd 4)  Actoplus Met 15-850 Mg Tabs (Pioglitazone hcl-metformin hcl) .Marland Kitchen.. 1 once daily 5)  Ramipril 10 Mg Caps (Ramipril) .... Take 1 capsule by mouth once a day 6)  Triamterene-hctz 37.5-25 Mg Tabs (Triamterene-hctz) .... One by mouth daily 7)  Cartia Xt 300 Mg Cp24 (Diltiazem hcl coated beads) .... One by mouth daily 8)  Crestor 20 Mg Tabs (Rosuvastatin calcium) .... Out 9)  Flecainide Acetate 100 Mg Tabs (Flecainide acetate) .... Take tree tablets by mouth as needed for palpitations  Patient Instructions: 1)  Please schedule a follow-up appointment in 4 months. 2)  labs one week prior to  visit 3)  lipids---272.4 4)  lfts-995.2 5)  bmet-995.2 6)  A1C-250.02 7)    8)  tsh---244.9 9)  Tobacco is very bad for your health and your loved ones! You Should stop smoking!. 10)  Stop Smoking Tips: Choose a Quit date. Cut down before the Quit date. decide what you will do as a substitute when you feel the urge to smoke(gum,toothpick,exercise).

## 2010-10-04 NOTE — Letter (Signed)
Summary: [facility] Missed Appt Notification  [facility]t [facility]   Imported By: Roderic Ovens 10/05/2009 16:27:09  _____________________________________________________________________  External Attachment:    Type:   Image     Comment:   External Document

## 2010-10-04 NOTE — Progress Notes (Signed)
Summary: REQ FOR REFILL ON MED  Phone Note Call from Patient   Caller: Patient @ [phone] Reason for Call: Refill Medication Action Taken: Patient advised to call 911 Summary of Call: Pt stated that she needs a refill on her thyroid med (Synthroid)...[facility] [address]... [facility] req [facility] but her req for refill would be passed along..... Info only--- Pt has appt on Thursday, Jan. 20, 2011 @ 7:45am with Dr Cato Mulligan.  Initial call taken by: Debbra Riding,  September 20, 2009 4:43 PM    Prescriptions: SYNTHROID 75 MCG TABS (LEVOTHYROXINE SODIUM) 1 QD  #90 Tablet x 2   Entered by:   Willy Eddy, LPN   Authorized by:   Birdie Sons MD   Signed by:   Willy Eddy, LPN on 09/81/1914   Method used:   [facility]  [address] [phone][facility] (retail)       [address]       [place], [place]  [id number]       Ph: [phone] or [phone]       Fax: [phone]   RxID:   [id number]

## 2010-10-04 NOTE — Letter (Signed)
Summary: [facility]   Imported By: Maryln Gottron 01/10/2010 10:24:27  _____________________________________________________________________  External Attachment:    Type:   Image     Comment:   External Document

## 2010-10-04 NOTE — Assessment & Plan Note (Signed)
Summary: 4 mo rov/jlp   Vital Signs:  Patient profile:   70 year old female Weight:      143 pounds BMI:     23.88 Pulse rate:   62 / minute Pulse rhythm:   regular Resp:     12 per minute BP sitting:   148 / 90  (left arm) Cuff size:   regular  Vitals Entered By: Gladis Riffle, RN (Jan 25, 2010 2:10 PM) CC: 4 month rov, labs done--stopped triamterene-HCTZ due to palpitations Is Patient Diabetic? No   Primary Care Provider:  Birdie Sons MD  CC:  4 month rov and labs done--stopped triamterene-HCTZ due to palpitations.  History of Present Illness:  Follow-Up Visit      This is a 70 year old woman who presents for Follow-up visit.  The patient denies chest pain and palpitations.  Since the last visit the patient notes no new problems or concerns.  The patient reports not taking meds as prescribed.  When questioned about possible medication side effects, the patient notes none.  See meds---updated, she self dcd a number of meds  All other systems reviewed and were negative   Preventive Screening-Counseling & Management  Alcohol-Tobacco     Alcohol drinks/day: 4     Alcohol type: all     >5/day in last 3 mos: yes     Alcohol Counseling: to STOP drinking     Feels need to cut down: yes     Smoking Status: current     Smoking Cessation Counseling: yes     Packs/Day: 0.5  Current Medications (verified): 1)  Metoprolol Tartrate 50 Mg Tabs (Metoprolol Tartrate) .Marland Kitchen.. 1& 1/2 Tab Qd 2)  Synthroid 75 Mcg Tabs (Levothyroxine Sodium) .Marland Kitchen.. 1 Qd 3)  Aspirin 81 Mg Tbec (Aspirin) .Marland Kitchen.. 1 To 2 Qd 4)  Ramipril 10 Mg Caps (Ramipril) .... Take 1 Capsule By Mouth Once A Day 5)  Cartia Xt 300 Mg  Cp24 (Diltiazem Hcl Coated Beads) .... One By Mouth Daily  Allergies: 1)  ! Triamterene-Hctz (Triamterene-Hctz) 2)  * Penicillin  Physical Exam  General:  Well developed, well nourished, in no acute distress.  smells of tobacco Head:  normocephalic and atraumatic.   Eyes:  pupils equal and  pupils round.   Ears:  R ear normal and L ear normal.   Neck:  supple without bruits or JVD. Chest Wall:  no deformities or breast masses noted Lungs:  Clear bilaterally to auscultation and percussion. Heart:  regular S1, S2, no S3, S4, or murmurs. Abdomen:  round, soft, nontender, nondistended, bowel sounds present x4. Msk:  No deformity or scoliosis noted of thoracic or lumbar spine.   Neurologic:  cranial nerves II-XII intact and gait normal.     Impression & Recommendations:  Problem # 1:  ATRIAL FIBRILLATION, PAROXYSMAL (ICD-427.31) No recurrence Her updated medication list for this problem includes:    Metoprolol Tartrate 50 Mg Tabs (Metoprolol tartrate) .Marland Kitchen... 1& 1/2 tab qd    Aspirin 81 Mg Tbec (Aspirin) .Marland Kitchen... 1 to 2 qd    Cartia Xt 300 Mg Cp24 (Diltiazem hcl coated beads) ..... One by mouth daily  Problem # 2:  DIABETES-TYPE 2 (ICD-250.00) ok to stay off meds diet reviewed with patient The following medications were removed from the medication list:    Actoplus Met 15-850 Mg Tabs (Pioglitazone hcl-metformin hcl) .Marland Kitchen... 1 once daily--noncompliant Her updated medication list for this problem includes:    Aspirin 81 Mg Tbec (Aspirin) .Marland Kitchen... 1 to  2 qd    Ramipril 10 Mg Caps (Ramipril) .Marland Kitchen... Take 1 capsule by mouth once a day  Labs Reviewed: Creat: 0.9 (01/19/2010)   Microalbumin: 53.8 (09/21/2006)  Last Eye Exam: normal-pt's report (02/02/2009) Reviewed HgBA1c results: 7.4 (01/19/2010)  7.3 (09/17/2009)  Problem # 3:  HYPOTHYROIDISM (ICD-244.9) will need f/u next visit Her updated medication list for this problem includes:    Synthroid 75 Mcg Tabs (Levothyroxine sodium) .Marland Kitchen... 1 qd  Labs Reviewed: TSH: 0.44 (05/06/2009)    HgBA1c: 7.4 (01/19/2010) Chol: 250 (01/19/2010)   HDL: 74.90 (01/19/2010)   LDL: 97 (09/17/2009)   TG: 128.0 (01/19/2010)  Problem # 4:  HYPERTENSION, ESSENTIAL NOS (ICD-401.9) she will not take additional meds The following medications were removed  from the medication list:    Triamterene-hctz 37.5-25 Mg Tabs (Triamterene-hctz) ..... One by mouth daily Her updated medication list for this problem includes:    Metoprolol Tartrate 50 Mg Tabs (Metoprolol tartrate) .Marland Kitchen... 1& 1/2 tab qd    Ramipril 10 Mg Caps (Ramipril) .Marland Kitchen... Take 1 capsule by mouth once a day    Cartia Xt 300 Mg Cp24 (Diltiazem hcl coated beads) ..... One by mouth daily  BP today: 148/90 Prior BP: 160/98 (09/23/2009)  Prior 10 Yr Risk Heart Disease: Not enough information (03/14/2007)  Labs Reviewed: K+: 3.9 (01/19/2010) Creat: : 0.9 (01/19/2010)   Chol: 250 (01/19/2010)   HDL: 74.90 (01/19/2010)   LDL: 97 (09/17/2009)   TG: 128.0 (01/19/2010)  Problem # 5:  TOBACCO USE (ICD-305.1)  she understands need to quit  Encouraged smoking cessation and discussed different methods for smoking cessation.   Complete Medication List: 1)  Metoprolol Tartrate 50 Mg Tabs (Metoprolol tartrate) .Marland Kitchen.. 1& 1/2 tab qd 2)  Synthroid 75 Mcg Tabs (Levothyroxine sodium) .Marland Kitchen.. 1 qd 3)  Aspirin 81 Mg Tbec (Aspirin) .Marland Kitchen.. 1 to 2 qd 4)  Ramipril 10 Mg Caps (Ramipril) .... Take 1 capsule by mouth once a day 5)  Cartia Xt 300 Mg Cp24 (Diltiazem hcl coated beads) .... One by mouth daily 6)  Crestor 20 Mg Tabs (Rosuvastatin calcium) .... 1/2 tablet by mouth daily  Patient Instructions: 1)  Please schedule a follow-up appointment in 4 months. 2)  labs one week prior to visit 3)  lipids---272.4 4)  lfts-995.2 5)  bmet-995.2 6)  A1C-250.02 7)

## 2010-10-04 NOTE — Assessment & Plan Note (Signed)
Summary: f69m/jss  Medications Added ACTOPLUS MET 15-850 MG TABS (PIOGLITAZONE HCL-METFORMIN HCL) 1/2  by mouth two times a day TRIAMTERENE-HCTZ 37.5-25 MG TABS (TRIAMTERENE-HCTZ) 1 by mouth dialy FLECAINIDE ACETATE 100 MG TABS (FLECAINIDE ACETATE) 1 by mouth as needed for palpitations      Allergies Added:    Visit Type:  Follow-up Primary Provider:  Birdie Sons MD  CC:  Atrial Fibrillation.  History of Present Illness: The patient presents for followup of her atrial fibrillation. Since last being seen she has had about 10 or so episodes of fibrillation. However, she is able to take flecainide and her symptoms resolve in about one hour. She's had no sustained tachyarrhythmias. She otherwise says she is feeling well. She takes care of her grandchildren and does some exercising. With this she denies any chest pressure, neck or arm discomfort. She has no new shortness of breath, PND or orthopnea. She unfortunately continues to smoke about 5 cigarettes per day.  Current Medications (verified): 1)  Metoprolol Tartrate 50 Mg Tabs (Metoprolol Tartrate) .Marland Kitchen.. 1& 1/2 Tab Qd 2)  Synthroid 75 Mcg Tabs (Levothyroxine Sodium) .Marland Kitchen.. 1 Qd 3)  Aspirin 81 Mg Tbec (Aspirin) .Marland Kitchen.. 1 To 2 Qd 4)  Ramipril 10 Mg Caps (Ramipril) .... Take 1 Capsule By Mouth Once A Day 5)  Cartia Xt 300 Mg  Cp24 (Diltiazem Hcl Coated Beads) .... One By Mouth Daily 6)  Crestor 20 Mg Tabs (Rosuvastatin Calcium) .... 1/2 Tablet By Mouth Daily 7)  Actoplus Met 15-850 Mg Tabs (Pioglitazone Hcl-Metformin Hcl) .... 1/2  By Mouth Two Times A Day 8)  Triamterene-Hctz 37.5-25 Mg Tabs (Triamterene-Hctz) .Marland Kitchen.. 1 By Mouth Dialy 9)  Flecainide Acetate 100 Mg Tabs (Flecainide Acetate) .Marland Kitchen.. 1 By Mouth As Needed For Palpitations  Allergies (verified): 1)  ! Triamterene-Hctz (Triamterene-Hctz) 2)  * Penicillin  Past History:  Past Medical History: Reviewed history from 04/02/2009 and no changes required. Parox. Atrial fibrillation COPD  via xray ch. bronchitis 8/00 Hyperlipidemia 263/LDL 148  Hypothyroidism 11/00 Osteoarthritis left wrist 11/00 Diabetes mellitus, type II 2/00  Past Surgical History: Reviewed history from 01/12/2009 and no changes required. Fracture left ankle  FNA Thyroid colloid cyst 12/00 Cath  nonobstructive CAD,  elev LVEDP 06/16/05 EMG'S neg 11/00  Review of Systems       As stated in the HPI and negative for all other systems.   Vital Signs:  Patient profile:   70 year old female Height:      65 inches Weight:      143 pounds BMI:     23.88 Pulse rate:   57 / minute Resp:     16 per minute BP sitting:   156 / 86  (right arm)  Vitals Entered By: Marrion Coy, CNA (March 11, 2010 9:41 AM)  Physical Exam  General:  Well developed, well nourished, in no acute distress. Head:  normocephalic and atraumatic Neck:  Neck supple, no JVD. No masses, thyromegaly or abnormal cervical nodes. Chest Wall:  no deformities or breast masses noted Lungs:  Clear bilaterally to auscultation and percussion. Abdomen:  Bowel sounds positive; abdomen soft and non-tender without masses, organomegaly, or hernias noted. No hepatosplenomegaly. Msk:  Back normal, normal gait. Muscle strength and tone normal. Skin:  Intact without lesions or rashes. Psych:  Normal affect.   Detailed Cardiovascular Exam  Neck    Carotids: Carotids full and equal bilaterally without bruits.      Neck Veins: Normal, no JVD.    Heart  Inspection: no deformities or lifts noted.      Palpation: normal PMI with no thrills palpable.      Auscultation: regular rate and rhythm, S1, S2 without murmurs, rubs, gallops, or clicks.    Vascular    Abdominal Aorta: no palpable masses, pulsations, or audible bruits.      Femoral Pulses: normal femoral pulses bilaterally.      Pedal Pulses: diminished right dorsalis pedis pulse, diminished right posterior tibial pulse, diminished left dorsalis pedis pulse, and diminished left posterior  tibial pulse.  diminished right dorsalis pedis pulse, diminished right posterior tibial pulse, diminished left dorsalis pedis pulse, and diminished left posterior tibial pulse.      Radial Pulses: normal radial pulses bilaterally.      Peripheral Circulation: no clubbing, cyanosis, or edema noted with normal capillary refill.     EKG  Procedure date:  03/11/2010  Findings:      Sinus rhythm, rate 57, left axis deviation, intervals within normal limits, no acute ST-T wave changes  Impression & Recommendations:  Problem # 1:  ATRIAL FIBRILLATION, PAROXYSMAL (ICD-427.31) She still has paroxysms of this but this seems to be well treated with the flecainide pill in the pocket plan.   Orders: EKG w/ Interpretation (93000)  Problem # 2:  TOBACCO USE (ICD-305.1) We discussed the need to stop smoking completely.  Problem # 3:  HYPERTENSION, ESSENTIAL NOS (ICD-401.9) I reviewed previous blood pressures and this one is slightly elevated compared with previous. I think compliance is the issue and diarrhea indurated the need to take medications as prescribed. At this visit I will not suggest to change to therapy.  Problem # 4:  HYPERLIPIDEMIA (ICD-272.4) I reviewed her lipids from May. Her LDL was 157. However, she was not taking her medications. She has since restarted these and Dr. Cato Mulligan has her scheduled to come back for repeat.  Patient Instructions: 1)  Your physician recommends that you schedule a follow-up appointment in: 12 months 2)  Your physician recommends that you continue on your current medications as directed. Please refer to the Current Medication list given to you today. 3)  You have been diagnosed with atrial fibrillation.  Atrial fibrillation is a condition in which one of the upper chambers of the heart has extra electrical cells causing it to beat very fast.  Please see the handout/brochure given to you today for further information. Prescriptions: FLECAINIDE ACETATE 100 MG  TABS (FLECAINIDE ACETATE) 1 by mouth as needed for palpitations  #30 x 6   Entered by:   Marrion Coy, CNA   Authorized by:   Rollene Rotunda, MD, [facility]   Signed by:   Marrion Coy, CNA on 03/11/2010   Method used:   [facility] [phone][facility] (retail[facility]       [address]       [place], [place]  [id number]       Ph: [phone] or [phone]       Fax: [phone]   RxID:   [id number]

## 2010-10-04 NOTE — Assessment & Plan Note (Signed)
Summary: 4 month rov/njr/pt rescd from bump//ccm   Vital Signs:  Patient profile:   70 year old female Weight:      137 pounds BMI:     22.88 Temp:     98.7 degrees F oral Pulse rate:   72 / minute BP sitting:   110 / 70  (left arm) Cuff size:   regular  Vitals Entered By: Romualdo Bolk, CMA Duncan Dull) (May 26, 2010 9:33 AM) CC: follow-up visit   Primary Care Provider:  Birdie Sons MD  CC:  follow-up visit.  History of Present Illness:  Follow-Up Visit      This is a 70 year old woman who presents for Follow-up visit.  The patient denies chest pain and palpitations.  Since the last visit the patient notes no new problems or concerns.  The patient reports not taking meds as prescribed---reports not taking crestor or actosplusmet regularly.  When questioned about possible medication side effects, the patient notes none.    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 1& 1/2 Tab Qd 2)  Synthroid 75 Mcg Tabs (Levothyroxine Sodium) .Marland Kitchen.. 1 Qd 3)  Aspirin 81 Mg Tbec  (Aspirin) .Marland Kitchen.. 1 To 2 Qd 4)  Ramipril 10 Mg Caps (Ramipril) .... Take 1 Capsule By Mouth Once A Day 5)  Cartia Xt 300 Mg  Cp24 (Diltiazem Hcl Coated Beads) .... One By Mouth Daily 6)  Crestor 20 Mg Tabs (Rosuvastatin Calcium) .... 1/2 Tablet By Mouth Daily 7)  Actoplus Met 15-850 Mg Tabs (Pioglitazone Hcl-Metformin Hcl) .... 1/2  By Mouth Two Times A Day 8)  Triamterene-Hctz 37.5-25 Mg Tabs (Triamterene-Hctz) .Marland Kitchen.. 1 By Mouth Dialy 9)  Flecainide Acetate 100 Mg Tabs (Flecainide Acetate) .Marland Kitchen.. 1 By Mouth As Needed For Palpitations  Allergies (verified): 1)  ! Triamterene-Hctz 2)  * Penicillin  Past History:  Past Medical History: Last updated: 04/02/2009 Parox. Atrial fibrillation COPD via xray ch. bronchitis 8/00 Hyperlipidemia 263/LDL 148  Hypothyroidism 11/00 Osteoarthritis left wrist 11/00 Diabetes mellitus, type II 2/00  Past Surgical History: Last updated: 2009-02-11 Fracture left ankle  FNA Thyroid colloid cyst 12/00 Cath  nonobstructive CAD,  elev LVEDP 06/16/05 EMG'S neg 11/00  Family History: Last updated: 02-11-09 Father-deceased murder age 51 Mother Breast CA/Mets 91 yo  Social History: Last updated: 02/11/09 Marital Status: Married, lives with husband Children:  Occupation: works at [employer]/ Alcohol use-yes  Risk Factors: Alcohol Use: 4 (05/26/2010) >5 drinks/d w/in last 3 months: yes (05/26/2010) Exercise: no (05/26/2010)  Risk Factors: Smoking Status: current (05/26/2010) Packs/Day: 0.5 (05/26/2010)  Physical Exam  General:  alert and well-developed.   Head:  normocephalic and atraumatic.   Eyes:  pupils equal and pupils round.   Ears:  R ear normal and L ear normal.   Neck:  supple without bruits or JVD. Lungs:  normal respiratory effort and no dullness.   Heart:  normal rate and regular rhythm.   Abdomen:  soft and non-tender.   Skin:  turgor normal and color normal.     Impression & Recommendations:  Problem # 1:  ATRIAL  FIBRILLATION, PAROXYSMAL (ICD-427.31) no recurrence continue current medications  Her updated medication list for this problem includes:    Metoprolol Tartrate 50 Mg Tabs (Metoprolol tartrate) .Marland Kitchen... 1& 1/2 tab qd    Aspirin 81 Mg Tbec (Aspirin) .Marland Kitchen... 1 to 2 qd    Cartia Xt 300 Mg Cp24 (Diltiazem hcl coated beads) ..... One by mouth daily    Flecainide Acetate 100 Mg Tabs (Flecainide acetate) .Marland Kitchen... 1 by mouth as needed for palpitations---followed by cardiology  Problem # 2:  DIABETES-TYPE 2 (ICD-250.00) change meds side effects discussed  Her updated medication list for this problem includes:    Aspirin 81 Mg Tbec (Aspirin) .Marland Kitchen... 1 to 2 qd    Ramipril 10 Mg Caps (Ramipril) .Marland Kitchen... Take 1 capsule by mouth once a day    Metformin Hcl 500 Mg Tabs (Metformin hcl) .Marland Kitchen... Take 1 tab twice daily  Labs Reviewed: Creat: 0.8 (05/17/2010)   Microalbumin: 53.8 (09/21/2006)  Last Eye Exam: normal-pt's report (02/02/2009) Reviewed HgBA1c results: 7.3 (05/17/2010)  7.4 (01/19/2010)  Problem # 3:  COPD (ICD-496) stop all smoking  Problem # 4:  TOBACCO USE (ICD-305.1)  Encouraged smoking cessation and discussed different methods for smoking cessation.   Problem # 5:  DEPRESSION (ICD-311) she denies any sxs  Complete Medication List: 1)  Metoprolol Tartrate 50 Mg Tabs (Metoprolol tartrate) .Marland Kitchen.. 1& 1/2 tab qd 2)  Synthroid 75 Mcg Tabs (Levothyroxine sodium) .Marland Kitchen.. 1 qd 3)  Aspirin 81 Mg Tbec (Aspirin) .Marland Kitchen.. 1 to 2 qd 4)  Ramipril 10 Mg Caps (Ramipril) .... Take 1 capsule by mouth once a day 5)  Cartia Xt 300 Mg Cp24 (Diltiazem hcl coated beads) .... One by mouth daily 6)  Crestor 20 Mg Tabs (Rosuvastatin calcium) .... 1/2 tablet by mouth daily 7)  Metformin Hcl 500 Mg Tabs (Metformin hcl) .... Take 1 tab twice daily 8)  Triamterene-hctz 37.5-25 Mg Tabs (Triamterene-hctz) .Marland Kitchen.. 1 by mouth dialy 9)  Flecainide Acetate 100 Mg Tabs (Flecainide acetate) .Marland Kitchen.. 1 by mouth as needed for palpitations 10)   Miralax Powd (Polyethylene glycol 3350) .Marland Kitchen.. 17g by mouth once daily as needed constipation  Patient Instructions: 1)  Please schedule a follow-up appointment in 4 months. 2)  labs one week prior to visit 3)  lipids---272.4 4)  lfts-995.2 5)  bmet-995.2 6)  A1C-250.02 7)     Prescriptions: METFORMIN HCL 500 MG TABS (METFORMIN HCL) Take 1 tab twice daily  #180 x 3   Entered and Authorized by:   Birdie Sons MD   Signed by:   Birdie Sons MD on 05/26/2010   Method used:   [facility]  [address] [phone]* (retail)       [address]       [place], [place]  [address]       Ph:  [phone] or [phone]       Fax: [phone]   RxID:   [id number]

## 2010-10-06 NOTE — Letter (Signed)
Summary: Colonoscopy Letter   Gastroenterology  [address]   [place], [place] [address]   Phone: [phone]  Fax: [phone]      September 27, 2010 MRN: [id number]   [facility] [address] [place], [place]  [address]   Dear [name],   According to your medical record, it is time for you to schedule a Colonoscopy. The American Cancer Society recommends this procedure as a method to detect early colon cancer. Patients with a family history of colon cancer, or a personal history of colon polyps or inflammatory bowel disease are at increased risk.  This letter has been generated based on the recommendations made at the time of your procedure. If you feel that in your particular situation this may no longer apply, please contact our office.  Please call our office at [phone] to schedule this appointment or to update your records at your earliest convenience.  Thank you for cooperating with [place] to provide you with the very best care possible.   Sincerely,   Stan Head, M.D.  [facility] Gastroenterology Division [phone]

## 2010-10-12 NOTE — Assessment & Plan Note (Signed)
Summary: fup//ccm   Vital Signs:  Patient profile:   70 year old female Weight:      138 pounds Temp:     98.1 degrees F oral Pulse rate:   68 / minute Pulse rhythm:   regular BP sitting:   164 / 84  (left arm) Cuff size:   regular  Vitals Entered By: Alfred Levins, CMA (October 03, 2010 9:25 AM) CC: discuss labs   Primary Care Provider:  Birdie Sons MD  CC:  discuss labs.  History of Present Illness:  Follow-Up Visit      This is a 70 year old woman who presents for Follow-up visit.  The patient denies chest pain and palpitations.  Since the last visit the patient notes no new problems or concerns.  The patient reports not taking meds as prescribed, not monitoring blood sugars, dietary noncompliance, and not exercising.  When questioned about possible medication side effects, the patient notes none.   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 1& 1/2 Tab Qd 2)  Synthroid 75 Mcg Tabs (Levothyroxine Sodium) .Marland Kitchen.. 1 Qd 3)  Aspirin 81 Mg Tbec (Aspirin) .Marland Kitchen.. 1 To 2 Qd 4)  Ramipril 10 Mg Caps (Ramipril) .... Take 1 Capsule By Mouth Once A Day 5)  Cartia Xt 300 Mg  Cp24 (Diltiazem Hcl Coated Beads) .... One By Mouth Daily 6)  Metformin Hcl 500 Mg Tabs (Metformin Hcl) .... Take 1 Tab Twice Daily 7)   Triamterene-Hctz 37.5-25 Mg Tabs (Triamterene-Hctz) .Marland Kitchen.. 1 By Mouth Dialy 8)  Flecainide Acetate 100 Mg Tabs (Flecainide Acetate) .Marland Kitchen.. 1 By Mouth As Needed For Palpitations 9)  Miralax   Powd (Polyethylene Glycol 3350) .Marland Kitchen.. 17g By Mouth Once Daily As Needed Constipation  Allergies (verified): 1)  ! Triamterene-Hctz 2)  * Penicillin  Past History:  Past Medical History: Last updated: 04/02/2009 Parox. Atrial fibrillation COPD via xray ch. bronchitis 8/00 Hyperlipidemia 263/LDL 148  Hypothyroidism 11/00 Osteoarthritis left wrist 11/00 Diabetes mellitus, type II 2/00  Past Surgical History: Last updated: Jan 14, 2009 Fracture left ankle  FNA Thyroid colloid cyst 12/00 Cath  nonobstructive CAD,  elev LVEDP 06/16/05 EMG'S neg 11/00  Family History: Last updated: Jan 14, 2009 Father-deceased murder age 54 Mother Breast CA/Mets 29 yo  Social History: Last updated: 10/03/2010 Marital Status: Married, lives with husband Children:  Occupation: not working smoker: 1ppd Alcohol use-yes drinks 6 beers/day  Risk Factors: Alcohol Use: 4 (05/26/2010) >5 drinks/d w/in last 3 months: yes (05/26/2010) Exercise: no (05/26/2010)  Risk Factors: Smoking Status: current (05/26/2010) Packs/Day: 0.5 (05/26/2010)  Social History: Marital Status: Married, lives with husband Children:  Occupation: not working smoker: 1ppd Alcohol use-yes drinks 6 beers/day  Physical Exam  General:  alert and well-developed.   Head:  normocephalic and atraumatic.   Eyes:  pupils equal and pupils round.   Neck:  supple without bruits or JVD.  Lungs:  normal respiratory effort and no accessory muscle use.   Heart:  normal rate and regular rhythm.   Abdomen:  soft and non-tender.   Msk:  No deformity or scoliosis noted of thoracic or lumbar spine.   Neurologic:  cranial nerves II-XII intact and gait normal.    Diabetes Management Exam:    Eye Exam:       Eye Exam done elsewhere          Date:  09/04/2010          Results: normal-pt's report          Done by: ophthalmo   Impression & Recommendations:  Problem # 1:  ATRIAL FIBRILLATION, PAROXYSMAL (ICD-427.31) no known recurrence continue meds  Her updated medication list for this problem includes:    Metoprolol Tartrate 50 Mg Tabs (Metoprolol tartrate) .Marland Kitchen... 1& 1/2 tab qd    Aspirin 81 Mg Tbec (Aspirin) .Marland Kitchen... 1 to 2 qd    Cartia Xt 300 Mg Cp24 (Diltiazem hcl coated beads) ..... One by mouth daily    Flecainide Acetate 100 Mg Tabs (Flecainide acetate) .Marland Kitchen... 1 by mouth as needed for palpitations  Problem # 2:  TOBACCO USE (ICD-305.1)  Encouraged smoking cessation and discussed different methods for smoking cessation.   Problem # 3:  ABUSE, ALCOHOL, CONTINUOUS (ICD-305.01) discussed alcoholissm with pt and her husband advised complete alcohol abstinence  Problem # 4:  DEPRESSION (ICD-311) she denies sxs and not on any medications  Problem # 5:  DIABETES-TYPE 2 (ICD-250.00) fair control advised no alcohol, no tobacco, and daily exercise Her updated medication list for this problem includes:    Aspirin 81 Mg Tbec (Aspirin) .Marland Kitchen... 1 to 2 qd    Ramipril 10 Mg Caps (Ramipril) .Marland Kitchen... Take 1 capsule by mouth once a day    Metformin Hcl 500 Mg Tabs (Metformin hcl) .Marland Kitchen... Take 1 tab twice daily  Labs Reviewed: Creat: 0.7 (09/21/2010)   Microalbumin: 53.8 (09/21/2006)  Last Eye Exam: normal-pt's report (09/04/2010) Reviewed HgBA1c results: 7.1 (09/21/2010)  7.3 (05/17/2010)  Complete Medication List: 1)  Metoprolol Tartrate 50 Mg Tabs (Metoprolol tartrate) .Marland Kitchen.. 1& 1/2 tab qd 2)  Synthroid 75 Mcg Tabs (Levothyroxine sodium) .Marland Kitchen.. 1 qd 3)  Aspirin 81 Mg Tbec (Aspirin) .Marland Kitchen.. 1 to 2 qd 4)  Ramipril 10 Mg Caps (Ramipril) .... Take 1 capsule by mouth once a day 5)  Cartia Xt 300 Mg Cp24 (Diltiazem hcl coated beads) .... One by mouth daily 6)  Metformin Hcl 500 Mg Tabs (Metformin hcl) .... Take 1 tab twice daily 7)   Triamterene-hctz 37.5-25 Mg Tabs (Triamterene-hctz) .Marland Kitchen.. 1 by mouth dialy 8)  Flecainide Acetate 100 Mg Tabs (Flecainide acetate) .Marland Kitchen.. 1 by mouth as needed for palpitations 9)  Miralax Powd (Polyethylene glycol 3350) .Marland Kitchen.. 17g by mouth once daily as needed constipation 10)  Atorvastatin Calcium 20 Mg Tabs (Atorvastatin calcium) .... Take 1 tablet by mouth once a day  Patient Instructions: 1)  3-4 months 2)  labs one week prior to visit 3)  lipids---272.4 4)  lfts-995.2 5)  bmet-995.2 6)  A1C-250.02 7)     8)  Tobacco is very bad for your health and your loved ones! You Should stop smoking!. 9)  Stop Smoking Tips: Choose a Quit date. Cut down before the Quit date. decide what you will do as a substitute when you feel the urge to smoke(gum,toothpick,exercise). 10)  It is important that you exercise regularly at least 20 minutes 5 times a week.  If you develop chest pain, have severe difficulty breathing, or feel very tired , stop exercising immediately and seek medical attention. 11)  It is not healthy  for men to drink more  1 drink per day. Prescriptions: ATORVASTATIN CALCIUM 20 MG TABS (ATORVASTATIN CALCIUM) Take 1 tablet by mouth once a day  #90 x 3   Entered and Authorized by:   Birdie Sons MD   Signed by:   Birdie Sons MD on 10/03/2010   Method used:   [facility]        [facility]  [address] [phone][facility] (retail)       [address]       [place], [place]  [address]       Ph: [phone] or [phone]       Fax: [phone]   RxID:   [id number]    Orders Added: 1)  Est. Patient Level IV [40347]

## 2010-10-30 ENCOUNTER — Other Ambulatory Visit: Payer: Self-pay | Admitting: Internal Medicine

## 2010-12-11 LAB — POCT CARDIAC MARKERS: Troponin i, poc: <0.05 ng/mL (ref 0.00–0.09)

## 2010-12-11 LAB — GLUCOSE, CAPILLARY
Glucose-Capillary: 105 mg/dL — ABNORMAL HIGH (ref 70–99)
Glucose-Capillary: 121 mg/dL — ABNORMAL HIGH (ref 70–99)

## 2010-12-11 LAB — COMPREHENSIVE METABOLIC PANEL
ALT: 17 U/L (ref 0–35)
AST: 13 U/L (ref 0–37)
Alkaline Phosphatase: 58 U/L (ref 39–117)
CO2: 25 mEq/L (ref 19–32)
Chloride: 100 mEq/L (ref 96–112)
Creatinine, Ser: 0.86 mg/dL (ref 0.4–1.2)
GFR calc Af Amer: >60 mL/min (ref >60)
GFR calc non Af Amer: >60 mL/min (ref >60)
Sodium: 136 mEq/L (ref 135–145)
Total Bilirubin: 1 mg/dL (ref 0.3–1.2)

## 2010-12-11 LAB — CBC
HCT: 46.2 % — ABNORMAL HIGH (ref 36.0–46.0)
MCHC: 33.8 g/dL (ref 30.0–36.0)
MCHC: 34.5 g/dL (ref 30.0–36.0)
MCV: 86.3 fL (ref 78.0–100.0)
MCV: 87.7 fL (ref 78.0–100.0)
Platelets: 153 10*3/uL (ref 150–400)
Platelets: 193 10*3/uL (ref 150–400)
RBC: 4.26 MIL/uL (ref 3.87–5.11)
RBC: 4.55 MIL/uL (ref 3.87–5.11)
RDW: 15.6 % — ABNORMAL HIGH (ref 11.5–15.5)
WBC: 6.3 10*3/uL (ref 4.0–10.5)
WBC: 6.8 10*3/uL (ref 4.0–10.5)

## 2010-12-11 LAB — CARDIAC PANEL(CRET KIN+CKTOT+MB+TROPI): Relative Index: INVALID (ref 0.0–2.5)

## 2010-12-11 LAB — BASIC METABOLIC PANEL
CO2: 26 mEq/L (ref 19–32)
Chloride: 100 mEq/L (ref 96–112)
Creatinine, Ser: 0.83 mg/dL (ref 0.4–1.2)
GFR calc Af Amer: >60 mL/min (ref >60)
Potassium: 3.5 mEq/L (ref 3.5–5.1)
Sodium: 137 mEq/L (ref 135–145)

## 2010-12-11 LAB — TROPONIN I: Troponin I: 0.06 ng/mL (ref 0.00–0.06)

## 2010-12-11 LAB — BRAIN NATRIURETIC PEPTIDE: Pro B Natriuretic peptide (BNP): 250 pg/mL — ABNORMAL HIGH (ref 0.0–100.0)

## 2010-12-11 LAB — DIFFERENTIAL
Basophils Absolute: 0 10*3/uL (ref 0.0–0.1)
Basophils Relative: 1 % (ref 0–1)
Eosinophils Absolute: 0.1 10*3/uL (ref 0.0–0.7)
Eosinophils Relative: 2 % (ref 0–5)
Lymphs Abs: 2.1 10*3/uL (ref 0.7–4.0)

## 2010-12-11 LAB — LIPID PANEL
HDL: 73 mg/dL (ref >39)
Total CHOL/HDL Ratio: 3.2 RATIO

## 2010-12-11 LAB — CK TOTAL AND CKMB (NOT AT ARMC): Relative Index: INVALID (ref 0.0–2.5)

## 2010-12-11 LAB — PROTIME-INR: Prothrombin Time: 13.2 seconds (ref 11.6–15.2)

## 2010-12-11 LAB — HEPARIN LEVEL (UNFRACTIONATED): Heparin Unfractionated: <0.1 IU/mL — ABNORMAL LOW (ref 0.30–0.70)

## 2011-01-11 ENCOUNTER — Other Ambulatory Visit: Payer: Self-pay | Admitting: *Deleted

## 2011-01-11 MED ORDER — RAMIPRIL 10 MG PO CAPS: 10.0000 mg | ORAL_CAPSULE | Freq: Two times a day (BID) | ORAL | Status: DC

## 2011-01-17 ENCOUNTER — Other Ambulatory Visit (INDEPENDENT_AMBULATORY_CARE_PROVIDER_SITE_OTHER): Payer: Medicare Other | Admitting: Internal Medicine

## 2011-01-17 DIAGNOSIS — E039 Hypothyroidism, unspecified: Secondary | ICD-10-CM

## 2011-01-17 DIAGNOSIS — T887XXA Unspecified adverse effect of drug or medicament, initial encounter: Secondary | ICD-10-CM

## 2011-01-17 DIAGNOSIS — E785 Hyperlipidemia, unspecified: Secondary | ICD-10-CM

## 2011-01-17 LAB — TSH: TSH: 0.6 u[IU]/mL (ref 0.35–5.50)

## 2011-01-17 LAB — BASIC METABOLIC PANEL
CO2: 28 mEq/L (ref 19–32)
Chloride: 97 mEq/L (ref 96–112)
Potassium: 3.8 mEq/L (ref 3.5–5.1)

## 2011-01-17 LAB — LIPID PANEL
Total CHOL/HDL Ratio: 4
VLDL: 34.4 mg/dL (ref 0.0–40.0)

## 2011-01-17 LAB — HEPATIC FUNCTION PANEL
ALT: 17 U/L (ref 0–35)
AST: 17 U/L (ref 0–37)
Bilirubin, Direct: 0.1 mg/dL (ref 0.0–0.3)
Total Bilirubin: 0.7 mg/dL (ref 0.3–1.2)

## 2011-01-17 LAB — LDL CHOLESTEROL, DIRECT: Direct LDL: 159.3 mg/dL

## 2011-01-17 NOTE — H&P (Signed)
NAMEMarland Kitchen  [name], [name]:  [id number]   MEDICAL RECORD NO.:  [id number]          PATIENT TYPE:  INP   LOCATION:  4736                         FACILITY:  [facility]   PHYSICIAN:  Rollene Rotunda, MD, FACCDATE OF BIRTH:  03-Dec-1940   DATE OF ADMISSION:  03/12/2009  DATE OF DISCHARGE:                              HISTORY & PHYSICAL   PRIMARY CARDIOLOGIST:  Rollene Rotunda, MD, [facility]   PATIENT PROFILE:  A 70 year old African American female with prior a  history of paroxysmal atrial fibrillation who presents with indigestion,  current AFib.   PROBLEMS:  1. Atrial fibrillation.      a.     Previously using flecainide pill in the pocket approach.  2. History of nonobstructive coronary artery disease.      a.     Status post type 2 non-STEMI in October 2006 in the setting       of rapid atrial fibrillation.      b.     Catheterization in June 16, 2005, nonobstructive disease,       ejection fraction 75%.  3. Hypothyroidism.  4. Hypertension.  5. Diabetes mellitus.  6. Hyperlipidemia.  7. Ongoing tobacco abuse.  8. Ethyl alcohol abuse.   HISTORY OF PRESENT ILLNESS:  A 70 year old [place] American female with  the above problem list.  She has not had paroxysmal AFib in several  years.  About  approximately a week to 10 days ago, she ran out of her  diltiazem and did not have it refilled.  Over the same course of time,  she began to experience nightly indigestion and belching specifically  while lying down, lasting about 40 minutes.  Indigestion started in the  epigastric area and went up to her midchest.  It was better with  belching and also was relieved with Tums and Pepto-Bismol.  Today, she  was getting in the bathtub and had sudden lightheadedness and  indigestion as well as chest pressure.  She got out of the tub and sat  down, after about 15 minutes, felt better.  Unfortunately, symptoms  reoccurred and she subsequently presented to urgent care.  There, she  was noted to be in AFib with RVR and sent to the [facility] ED.  There  she has been treated with intravenous diltiazem bolus 20 mg and her rate  has decreased from 130s to 70s.  She is now feeling much better.   ALLERGIES:  PENICILLIN.   HOME MEDICATIONS:  1. Actos/metformin 15/850 mg daily.  2. Aspirin 81 mg daily.  3. Crestor 20 mg daily.  4. Diltiazem 300 mg daily.  5. Synthroid 75 mcg daily.  6. Lopressor 50 mg daily.  7. Triamterene/hydrochlorothiazide 37.5/25 mg daily.  8. Ramipril 10 mg daily.  9. Flecainide p.r.n.   FAMILY HISTORY:  Mother died of breast cancer in 01/27/98.  Father died in  his 48s.  The patient thinks he was poisoned.  She had a brother who  died at age 75 with complication of diabetes, heart failure, and drug  abuse.  She had a sister who died at 49 when  she was murdered.   SOCIAL HISTORY:  She lives in [place] with her husband.  She is  retired from [employer].  She has a 75-pack-year history of tobacco  abuse, currently smoking a pack and a half per day.  She drinks 3 beers  a day.  She denies drug use.  She is not routinely exercising.   REVIEW OF SYSTEMS:  Positive for chest pain, palpitations,  lightheadedness, constipation, GERD symptoms, and diabetes.  Otherwise  all systems reviewed and negative.  She is a full code.   PHYSICAL EXAMINATION:  VITAL SIGNS:  Temperature 98.6, heart rate was up  to 138 and currently in the 90s, respirations 18, blood pressure 132/90,  and pulse ox 100% on 2 L.  GENERAL:  Pleasant African American female, in no acute distress, awake,  alert, and oriented x3.  Normal affect.  HEENT:  Normal.  NEUROLOGIC:  Grossly intact and nonfocal.  SKIN:  Warm and dry without lesions or masses.  NECK:  Supple without bruises or JVD.  LUNGS:  Respirations regular, unlabored.  Clear to auscultation.  CARDIAC:  Irregularly irregular.  S1 and S2.  No S3, S4, or murmurs.  ABDOMEN:  Round, soft, nontender, and nondistended.   Bowel sounds  present x4.  EXTREMITIES:  Warm, dry, and pink.  No clubbing, cyanosis, or edema.  Dorsalis pedis and posterior tibial pulses 2+ equal bilaterally.   ACCESSORY CLINICAL FINDINGS:  Chest x-ray shows no active lung disease,  stable borderline cardiomegaly.  EKG shows AFib, rate of 160, normal  axis, minimal diffuse ST depression, 2, 3, aVF, V3 through V6.   LABORATORY DATA:  Hemoglobin 15.9, hematocrit 46.2, WBC 6.2, and  platelets 193.  Sodium 137, potassium 3.5, chloride 100, CO2 26, BUN 15,  creatinine 0.83, and glucose 131.  CK-MB 1.2.  Troponin I less than  0.05.   ASSESSMENT:  1. Atrial fibrillation with rapid ventricular response, ? duration.      She ran out of diltiazem about a week ago, but resumed yesterday.      She could not find her flecainide last night and that is why she      has not taken it.  Heart rates are now between the 70s and 90s      after diltiazem bolus.  We will place her on diltiazem drip for now      in good hope that she will convert.  We will continue her beta-      blocker.  Her CHADS score is 2.  She was not previously on      Coumadin.  At this time, we will add heparin and Coumadin.  If she      does not convert by a.m., but is reasonably rate controlled, we      could consider a discharge on Coumadin therapy and then if she      remains in atrial fibrillation after 3-4 weeks with therapeutic      INR, we could consider cardioversion.  Alternatively, we could      consider TEE and cardioversion on Monday.  If she converts before      tomorrow, we will plan to just resume diltiazem, beta-blocker, and      aspirin, and she will follow up with Dr. Antoine Poche following that.  2. Chest pain/indigestion.  Pain or indigestion at home had been      relieved with Pepto-Bismol and antacids.  She has had positive      enzymes in  the past in the setting of rapid AFib.  I will cycle      cardiac markers.  She had a nonobstructive cath in 2006.   Continue      beta-blocker and aspirin and adding heparin.  We will also add PPI.  3. Hypertension.  Blood pressure currently stable.  Follow on      diltiazem.  4. Hypothyroidism.  Check TSH.  Continue Synthroid.  5. Diabetes mellitus.  We will hold her on metformin.  Continue Actos      and add sliding scale insulin.  6. Hyperlipidemia.  Check lipids and LFTs.  Continue statin therapy.      Nicolasa Ducking, ANP      Rollene Rotunda, MD, [facility]  Electronically Signed    CB/MEDQ  D:  03/12/2009  T:  03/13/2009  Job:  [id number]

## 2011-01-17 NOTE — Discharge Summary (Signed)
[name], [name]               ACCOUNT NO.:  [id number]   MEDICAL RECORD NO.:  [id number]          PATIENT TYPE:  INP   LOCATION:  4736                         FACILITY:  [facility]   PHYSICIAN:  Noralyn Pick. Eden Emms, MD, FACCDATE OF BIRTH:  1941/06/12   DATE OF ADMISSION:  03/12/2009  DATE OF DISCHARGE:  03/14/2009                               DISCHARGE SUMMARY   PRIMARY CARDIOLOGIST:  Rollene Rotunda, MD, [facility]   DISCHARGE DIAGNOSES:  1. Paroxysmal atrial fibrillation with rapid ventricular response.      a.     Subsequent conversion to normal sinus rhythm, on intravenous       diltiazem.      b.     CHAD2 score:  2, on aspirin.      c.     History of paroxysmal atrial fibrillation.  2. Hypokalemia.   SECONDARY DIAGNOSES:  1. History of nonobstructive coronary artery disease.  2. Normal ventricular function.  3. Hypertension.  4. Diabetes mellitus.  5. Hypothyroidism. back to it.   REASON FOR ADMISSION:  [name] is a 70 year old female, with history  of paroxysmal atrial fibrillation and nonobstructive CAD, who presented  with tachy palpitations.  She reportedly had run out of her diltiazem  approximately 1 week prior.  She did present with associated complaint  of near syncope and chest discomfort and was found to be in atrial  fibrillation at a rate of approximately 130 bpm.   HOSPITAL COURSE:  The patient was placed on intravenous diltiazem, with  subsequent conversion to normal sinus rhythm, where she remained by the  time of discharge 48 hours later.  Serial cardiac markers were drawn, it  was nondiagnostic for definite ischemia.  Troponins were minimally  elevated (peak 0.15), with otherwise normal MBs.   The patient has a history of hypothyroidism, and TSH was drawn and was  normal.   Mild hypokalemia was noted (3.4), and repleted prior to discharge.   DISPOSITION:  At the time of discharge:  Stable.   DISCHARGE LABORATORY DATA:  None.   DISCHARGE MEDICATIONS:  1. Diltiazem CD 300 mg daily.  2. Aspirin 81 mg daily.  3. Crestor 20 mg daily.  4. Actos/metformin 15/850 mg daily.  5. Levothyroxine 0.075 mg daily.  6. Metoprolol tartrate 25 mg b.i.d.  7. Ramipril 10 mg daily.  8. Maxzide 37.5/25 mg daily.  9. K-Dur 20 mEq daily (new).   INSTRUCTIONS:  1. The patient will need to follow up with Dr. Rollene Rotunda in our      [facility] in approximately 2 weeks.  Arrangements will be      made through our office.  2. The patient will need to follow up BMET in 1 week, for monitoring      of hypokalemia.   DISCHARGE ENCOUNTER:  Including physician time, greater than 30 minutes.      Gene Serpe, PA-C      Peter C. Eden Emms, MD, [facility]  Electronically Signed    GS/MEDQ  D:  03/14/2009  T:  03/15/2009  Job:  [id number]

## 2011-01-17 NOTE — Discharge Summary (Signed)
[name], [name]:  [id number]   MEDICAL RECORD NO.:  [id number]          PATIENT TYPE:  INP   LOCATION:  4736                         FACILITY:  [facility]   PHYSICIAN:  Rollene Rotunda, MD, FACCDATE OF BIRTH:  [birth year]   DATE OF ADMISSION:  03/12/2009  DATE OF DISCHARGE:                               DISCHARGE SUMMARY   ADDENDUM   ADDITIONAL ASSESSMENT AND PLAN:  1. Ongoing tobacco abuse.  Smoking cessation is strongly advised.  We      will obtain a tobacco cessation consult.  2. Ethyl alcohol abuse.  We will write for DVT prophylaxis.  The      patient has been counseled on importance of quitting.      Nicolasa Ducking, ANP      Rollene Rotunda, MD, [facility]  Electronically Signed    CB/MEDQ  D:  03/12/2009  T:  03/13/2009  Job:  [id number]

## 2011-01-20 NOTE — Assessment & Plan Note (Signed)
[facility]                            CARDIOLOGY OFFICE NOTE   LUISANA, LUTZKE                      MRN:          [id number]  DATE:08/21/2006                            DOB:          May 24, 1941    PRIMARY CARE PHYSICIAN:  Arta Silence, M.D.   REASON FOR PRESENTATION:  A patient with hypertension and paroxysmal  atrial fibrillation.   HISTORY OF PRESENT ILLNESS:  The patient is 70 years old.  She returns  for followup.  Since I saw her she has had bouts of symptomatic atrial  fibrillation about every other month.  She has taken her pill and pocket  Flecainide (300 mg bolus).  Of note, we did initiate this the first time  in the emergency room and watch her for an appropriate time, and she had  no dysrhythmias.  Each time this has broken the atrial fibrillation  quickly.  She has found no instigating events; that is, these have not  been associated with drinking beers, which they had been in the past.  She has otherwise been doing well.  She denies any chest pressure, neck  discomfort or arm discomfort.  She has had no PND or orthopnea.  She has  had no pre-syncope or syncope.   PAST MEDICAL HISTORY:  1. Hypothyroidism.  2. Hypertension.  3. Type 2 diabetes mellitus.  4. Hyperlipidemia.  5. Non-obstructive coronary artery disease (left main 20% stenosis,      LAD 20%, followed by 40% stenosis, obtuse marginal off the      circumflex 30% stenosis, right coronary artery 50% stenosis),      paroxysmal atrial fibrillation, normal left ventricular function.   ALLERGIES:  No known drug allergies.   MEDICATIONS:  1. Synthroid 88 mcg q.d.  2. Altace 10 mg q.d.  3. Metformin 500 mg b.i.d.  4. Metoprolol 75 mg b.i.d.  5. Diltiazem 240 mg q.d.  6. Actos:  (The patient is not clear on this dose, and started since      the last visit.)  7. Vytorin:  (The patient is not clear on this dose, and started since      the last visit.)   REVIEW  OF SYSTEMS:  As stated in the HPI, otherwise negative for other  systems.   PHYSICAL EXAMINATION:  GENERAL:  The patient is in no distress.  VITAL SIGNS:  Blood pressure 130/80, heart rate 58 and regular, weight  130 pounds.  Body mass index 20.  HEENT:  Eyes unremarkable.  Pupils equal, round, reactive to light.  Fundi not visualized.  Oral mucosa unremarkable.  NECK:  No jugular venous distention.  Wave form within normal limits.  Carotid upstroke brisk and symmetric.  No bruits, no thyromegaly.  LYMPHATICS:  No cervical, axillary or inguinal adenopathy.  LUNGS:  Clear to auscultation bilaterally.  BACK:  No costovertebral angle tenderness.  CHEST:  Unremarkable.  HEART:  PMI not displaced or sustained.  S1 and S2 within normal limits.  No S3, no S4, no murmurs.  ABDOMEN:  Flat, positive bowel sounds.  Normal in frequency and pitch.  No bruits, no guarding, no guarding.  No midline pulse, no mass, no  hepatomegaly, no splenomegaly.  SKIN:  No rashes, no nodules.  EXTREMITIES:  With 2+ pulses, no edema.   Electrocardiogram:  Sinus rhythm, rate 58, axis within normal limits,  intervals within normal limits, no acute ST-T wave changes.   ASSESSMENT/PLAN:  1. Atrial fibrillation:  The patient has paroxysmal atrial      fibrillation.  She is using Flecainide as described above.  She      knows to let me know if this increases in frequency, as we would      just go to a daily regimen.  For now it is working well and there      are no contraindications to this.  She is low risk for      thromboembolism, and so at this point does not need      anticoagulation.  2. Hypertension:  Her blood pressure is reasonably controlled.  She is      taking the Maxzide as prescribed.  She has had follow-up blood      work.  She will continue on the regimen as listed.  3. Tobacco:  I again educated her about the need to stop smoking.  4. Nonobstructive coronary artery disease:  She has been having  risk      reduction per Dr. Arta Silence.  The goal would be a low-      density lipoprotein of less than 100 and a high-density lipoprotein      in the 50's.   FOLLOWUP:  I will see her back in six months, and then perhaps yearly if  she is doing well; however, again she needs to let me know if she is  having increased frequency of these paroxysms.     Rollene Rotunda, MD, [facility]  Electronically Signed    JH/MedQ  DD: 08/21/2006  DT: 08/21/2006  Job #: [id number]   cc:   Arta Silence, MD

## 2011-01-20 NOTE — Assessment & Plan Note (Signed)
[facility]                              CARDIOLOGY OFFICE NOTE   [name], [name]                      MRN:          [id number]  DATE:03/16/2006                            DOB:          06-27-1941    PRIMARY:  Billie Bean   REASON FOR PRESENTATION:  Evaluate patient with atrial fibrillation.   HISTORY OF PRESENT ILLNESS:  Patient returns for follow-up.  She was seen in  the emergency room in June with paroxysmal atrial fibrillation.  Unlike  previous episodes this was not related to drinking beer.  She had been  stressed with constipation.  She got symptomatic with this atrial  fibrillation and rapid rate.  She was treated per our previous plan with a  bolus 300 mg flecainide dose.  She subsequently converted to sinus rhythm.  She was watched on telemetry and follow-up EKGs and had no dysrhythmias or  EKG changes with this.  She was discharged with plan to use the flecainide  bolus in the future should she have paroxysms.  She was again instructed to  avoid beer.  She is also noted to have a slightly low TSH and is to follow  up very soon with Dr. Hetty Ely for this.   She has had no further paroxysms.  She has had no chest pain or shortness of  breath.  She has had no palpitations or presyncope or syncope.  She denies  any PND or orthopnea.   Hypothyroidism, hypertension, type 2 diabetes mellitus, hyperlipidemia, non-  obstructive coronary disease, paroxysmal atrial fibrillation.   ALLERGIES:  None.   MEDICATIONS:  1.  Synthroid 88 mcg daily.  2.  Altace 10 mg daily.  3.  Metformin 500 mg b.i.d.  4.  Metoprolol 75 mg b.i.d.  5.  Diltiazem 240 mg daily.  6.  Flecainide p.r.n.   REVIEW OF SYSTEMS:  As stated in the HPI and otherwise negative for other  systems.   PHYSICAL EXAMINATION:  GENERAL:  Patient is in no distress.  VITAL SIGNS:  Blood pressure 150/98, heart rate 57 and regular, weight 133  pounds, body mass index 23.  HEENT:  Eyelids unremarkable.  Pupils are equal, round, and reactive to  light.  Fundi not visualized.  Oral mucosa unremarkable.  NECK:  No jugular venous distention.  Wave form within normal limits.  Carotid upstroke brisk and symmetric.  No bruits.  No thyromegaly.  LYMPHATICS:  No cervical, axillary, inguinal adenopathy.  LUNGS:  Clear to auscultation bilaterally.  No wheezing.  No crackles.  BACK:  No costovertebral angle tenderness.  CHEST:  Unremarkable.  HEART:  PMI not displaced or sustained.  S1 and S2 within normal limits.  No  S3.  No S4.  No murmurs.  ABDOMEN:  Flat.  Positive bowel sounds.  Normal in frequency and pitch.  No  bruits, rebound, guarding.  No midline pulsatile mass.  No organomegaly.  SKIN:  No rashes.  No nodules.  EXTREMITIES:  2+ pulses __________.  No cyanosis, clubbing.  NEUROLOGIC:  Oriented to person, place, and time.  Cranial nerves II-XII  grossly  intact.  Motor grossly intact.   EKG:  Sinus rhythm, rate 57, axis within normal limits, intervals within  normal limits, no acute ST-T wave change.   ASSESSMENT AND PLAN:  1.  Paroxysmal atrial fibrillation.  Patient is having no further paroxysms      with this.  She is going to have her thyroid adjusted.  She is going to      use the flecainide as needed.  No further cardiovascular testing is      suggested at this point.  2.  Hypertension.  Blood pressure continues to be elevated.  I am going to      add Maxzide one p.o. daily.  She will get a BMET in about two weeks.  3.  Follow-up.  I would like to see her back in about four months or sooner      if needed.                               Rollene Rotunda, MD, [facility]    JH/MedQ  DD:  03/16/2006  DT:  03/16/2006  Job #:  [id number]   cc:   Arta Silence, MD

## 2011-01-20 NOTE — H&P (Signed)
NAMEARLISS, [name]:  [id number]   MEDICAL RECORD NO.:  [id number]          PATIENT TYPE:  INP   LOCATION:  1825                         FACILITY:  [facility]   PHYSICIAN:  [name], M.D. LHCDATE OF BIRTH:  Jul 23, 1941   DATE OF ADMISSION:  01/22/2006  DATE OF DISCHARGE:                                HISTORY & PHYSICAL   CARDIOLOGIST:  Dr. Rollene Brady.   PRIMARY CARE PHYSICIAN:  Dr. Marlise Brady.   CHIEF COMPLAINT:  Chest pain and palpitations.   HISTORY OF PRESENT ILLNESS:  The patient is a 70 year old female with known  paroxysmal atrial fibrillation who presented to the ER with complaints of  chest pain and palpitations. The patient last experienced chest pain and  palpitations in January of 2007 after she was admitted with A fib with RVR  with negative cardiac enzymes. She converted after being rate controlled  with diltiazem. She has done well since then until last evening when she  began feeling stressed about the recent murder of her mother's friend. She  drank three beers and then went to bed. She was awakened around 10-11 p.m.  with chest pain and palpitations. She also experienced mild shortness of  breath, no diaphoresis, no nausea or vomiting, no syncope. She denied  radiation of her pain, she did feel fatigued. Her husband then brought her  to the emergency room. In the ER, she was found to be in atrial fibrillation  with rapid ventricular response. She was started on diltiazem drip at 5 mg  IV which improved her rate from 80s to 120. Her blood pressure remained  stable and her symptoms resolved. Cardiology is now being called for  admission.   PAST MEDICAL HISTORY:  1.  Paroxysmal atrial fibrillation. She has had 4 hospital or ER evaluations      related to this beginning October 2006 and she had positive troponin at      that time. She underwent a cardiac catheterization which revealed      nonobstructive disease. She converted  after IV diltiazem regarding her      atrial fibrillation. Her next admission was in November of 2006 when she      was evaluated in the emergency room. She converted with diltiazem and      then was sent home to continue p.o. diltiazem. She then was admitted in      January 2007 where she had negative cardiac enzymes. She converted after      diltiazem IV and was discharged on home p.o. diltiazem. It appears that      she was evaluated at [facility] in February of 2007 when she had A fib      with RVR and was discharged from the emergency room. She had an echo      done in October of 2006 which revealed an EF of 55-65%, basal      hypertrophy, she had trivial MR. Her left atrial size was normal.  2.  Hypertension.  3.  Diabetes.  4.  Dyslipidemia.  5.  Coronary artery disease. She had  a positive troponin in the setting of A      fib with RVR in October of 2006. She had a cath done at that time which      revealed left main 20 %, LAD 20% followed by 40%. She had 2 diagonals      which had 20-30% stenosis. Her circumflex had minor irregularities with      a 30% stenosis and her RCA had 50% mid. Her EF was noted to be 75%.  6.  Hypothyroidism on Synthroid.  7.  History of panic attacks.  8.  Status post appendectomy.   SOCIAL HISTORY:  She lives in [place] with her husband. She has 3  children. She has a 20 plus pack year history of tobacco which is active at  1 pack per day. She drinks 3 beers per day. She denies any drugs or herbal  medications. She drinks decaffeinated coffee but did have increase  caffeinated tea recently. She denies any use of decongestants, no recent  cold symptoms. She does not get any regular exercise.   FAMILY HISTORY:  Her mother died at the age of 61 with breast cancer  complications. Father died at the age of 86 from a poisoning. She has no  siblings.   REVIEW OF SYSTEMS:  She denies any fevers or chills, no sweats, no weight  changes, no headache or  visual changes, no skin rashes or lesions. She had  mild shortness of breath with no palpitations or chest pain. She denies any  dyspnea on exertion, no orthopnea, no PND, no lower extremity edema. She  does report palpitations as stated in the HPI. No coughing, no wheezing, no  urinary symptoms. No nausea, vomiting, no diarrhea, no bright red blood per  rectum, no melena, no hematemesis, all other systems are negative.  Neurologic no focal weakness. She does report generalized fatigue.   PHYSICAL EXAMINATION:  VITAL SIGNS:  Temperature is 98.3, pulse is 84 to  135, respiration is 16. Blood pressure 132/84. She is satting 98% on room  air.  GENERAL:  She is a very pleasant female, comfortable in no acute distress.  HEENT:  Normocephalic, atraumatic.  NECK:  Her JVD is approximately 7 cm. She has no bruits. She has 2+ carotid  upstroke. No thyromegaly.  CARDIOVASCULAR:  Normal S1, split S2, irregular irregular, tachycardic. No  murmurs appreciated. Her pulses are 2+ throughout and equal.  LUNGS:  She has clear breath sounds bilaterally with no wheezing, rales or  rhonchi.  ABDOMEN:  Soft, positive bowel sounds, nontender, no organomegaly.  EXTREMITIES:  She has no edema.  NEUROLOGIC:  Nonfocal.   Chest x-ray is pending. EKG shows rate of 121, atrial fibrillation. QRS  interval is 79, QTC is 437 msec. She has minimal ST depression in the  inferior leads. She has a Q wave noted in the AVL. She has no hypertrophy  present. This is similar to EKG dated October 13, 2005 from [place].   LABORATORY DATA:  White count 2.8, hematocrit 43.6, platelet 260, potassium  5.2, creatinine 0.7, glucose 166. Point of care cardiac enzyme, CK-MB of  1.1, troponin 0.09, myoglobin 37.4.   ASSESSMENT/PLAN:  The patient is a very pleasant 70 year old female with  known paroxysmal atrial fibrillation ___________ of A fib with RVR and a  positive troponin. 1.  Atrial fibrillation. Her rate has improved  with diltiazem drip. Will      restart her diltiazem p.o. in addition to metoprolol which had been  recently stopped by her primary care physician. Hopefully this will be      able to improve her rate control overall. Will anticoagulate her with      Lovenox for now and monitor on telemetry for possible conversion and if      she does not convert on her own will plan for DC cardioversion. The      timing of onset of her atrial fibrillation symptoms appears to be around      10-11 p.m. last night.  2.  Coronary artery disease. The patient had nonobstructive disease on her      last cardiac catheterization dated October 2006. Her troponin is now      positive. Will cycle her enzymes for a trend and monitor her CK-MB      closely. Will consider a repeat heat catheterization versus a stress      test for further evaluation of this abnormal troponin pending her labs      and symptoms. This may be related to her tachycardia on her      nonobstructive disease but the troponin being positive with less than 2      hours of symptoms is concerning.  3.  Diabetes. Will cover her with sliding scale insulin and hold her      metformin as she may undergo a cardiac catheterization.  4.  Hypertension. I am adjusting her medications. I stopped her Lotrel as      she is on Altace. I suspect that her hyperkalemia is related to double      ACE inhibitor use. I restarted her metoprolol to improve her blood      pressure. Will follow her potassium closely.  5.  Hypothyroidism. Will continue her on Synthroid and take a TSH.  6.  Mild hyperkalemia. Her potassium is 5.2. This may be related to using      both Lotrel and Altace. I stopped Lotrel and will continue her on      Altace. Will repeat her potassium now as it may have been a hemolyzed      sample and will follow this closely.           ______________________________  [name], M.D. [facility]     CGF/MEDQ  D:  01/22/2006  T:  01/22/2006  Job:   [id number]

## 2011-01-20 NOTE — Discharge Summary (Signed)
NAMEEMMARIE, SANNES NO.:  [id number]   MEDICAL RECORD NO.:  [id number]          PATIENT TYPE:  INP   LOCATION:  1823                         FACILITY:  [facility]   PHYSICIAN:  Rollene Rotunda, M.D.   DATE OF BIRTH:  1941/08/12   DATE OF ADMISSION:  02/22/2006  DATE OF DISCHARGE:                                 DISCHARGE SUMMARY   REASON FOR ADMISSION:  Paroxysmal atrial fibrillation with rapid ventricular  rate.   DISCHARGE DIAGNOSES:  1.  Paroxysmal atrial fibrillation subsequently converted to sinus rhythm      with flecainide.  2.  Nonobstructive coronary disease by catheterization 2006.  3.  Treated hypothyroidism.  4.  Treated hypertension.  5.  Diabetes mellitus type 2.  6.  Hyperlipidemia.   HISTORY:  Ms. Acheampong is a 70 year old female patient followed by Dr.  Antoine Poche with the above noted history who presented to the emergency room  with palpitations.  She was found to be in atrial fibrillation with a rapid  ventricular rate.  Her cardiac enzymes were negative.  Her cardiac enzymes  were negative.  Her potassium was initially 3.1.  This was replaced.   HOSPITAL COURSE:  The patient was observed in the emergency room.  She was  given flecainide 300 mg and converted to sinus rhythm.  She was watched over  the next several hours.  Dr. Antoine Poche saw the patient in the morning of February 21, 2006.  She was still in normal sinus rhythm.  We checked her potassium  level and it was 4.8.  Therefore, it was felt she was ready for discharge  home.  She is to follow up with Dr. Antoine Poche in the next 2 weeks.   DISCHARGE MEDICATIONS:  1.  Diltiazem.  2.  Lotrel.  3.  Metformin.  4.  Synthroid.  5.  Flecainide 300 mg.  She is to take this 1 time if she develops recurrent      atrial fibrillation.  If this does not resolve in the next 3 hours, she      is to go to the emergency room. She is not to use this medication within      the next 3 days after taking 1 dose of  flecainide.  This has been      explained to the patient in detail.   ACTIVITY:  As tolerated.   DIET:  Low fat, low sodium.   FOLLOWUP:  Follow up will be with Dr. Antoine Poche on July 13th, 11:30 a.m.      Tereso Newcomer, P.A.    ______________________________  Rollene Rotunda, M.D.    SW/MEDQ  D:  02/23/2006  T:  02/23/2006  Job:  [id number]

## 2011-01-20 NOTE — Consult Note (Signed)
NAMEMarland Kitchen  KARMYN, LOWMAN NO.:  [id number]   MEDICAL RECORD NO.:  [id number]          PATIENT TYPE:  EMS   LOCATION:  MAJO                         FACILITY:  [facility]   PHYSICIAN:  Rollene Rotunda, M.D.   DATE OF BIRTH:  Feb 17, 1941   DATE OF CONSULTATION:  06/15/2005  DATE OF DISCHARGE:                                   CONSULTATION   PRIMARY CARE PHYSICIAN:  Billy __________, M.D.   REASON FOR CONSULTATION:  The patient with atrial fibrillation and chest  pain.   HISTORY OF PRESENT ILLNESS:  The patient is a pleasant 70 year old with no  prior cardiac history.  She has had episodes of panic attacks.  She has  been under a lot of stress as her mother was ill with breast cancer and just  recently died.  With one of her attacks she presented to urgent care.  There  were no apparent arrhythmias noted.  She has had two others prior to this  one.  Last night she was talking on the phone with a friend.  She became  emotional.  She developed chest discomfort.  It was similar to her previous  presumed panic attacks.  She describes a tightness or pressure.  It was  substernal and radiated across her upper epigastric area.  There was no  radiation to her arm or to her neck.  She was not short of breath and was  not nauseated or vomiting.  She took a nitroglycerin from her husband but  this did not help.  She did notice her heart racing as she had with other  episodes.  She presented to the emergency room where she was noted to be in  atrial fibrillation with a rapid ventricular response.  She was treated with  IV Cardizem.  She had resolution of her symptoms.  She converted to sinus  rhythm sometime in the evening.  The patient is now pain-free.   Of note she reports that she has not had any other symptoms when she is not  having these attacks.  She is active and climbs stairs.  She denies any PND  or orthopnea.  She has had no presyncope or syncope.   PAST MEDICAL HISTORY:   Hyperlipidemia.  Hypertension.  Hypothyroidism.  Anxiety.  Diabetes mellitus x3 years.   PAST SURGICAL HISTORY:  Appendectomy.   ALLERGIES:  PENICILLIN.   CURRENT MEDICATIONS:  Lotrel 10/20 daily.  Altace 10 daily.  Synthroid.  Avandamet.  Vytorin (we do not know the dose of the last three medications).   SOCIAL HISTORY:  She lives in [place] with her husband of 20 years.  They  have been together for 30 years.  She drinks an occasional beer.  She smokes  about one half per day and has done so for 50 years.  She has been smoking  three packs a day for the last week.   FAMILY HISTORY:  Negative for early coronary artery disease in first degree  relatives.   REVIEW OF SYSTEMS:  As stated in the HPI.  Positive for arthralgias in the  left shoulder.  Negative for other systems.   PHYSICAL EXAMINATION:  VITAL SIGNS:  The patient is in no distress.  Blood  pressure 110/52, rate 70 and regular, afebrile.  HEENT:  Eyes unremarkable.  Pupils equal, round, and reactive equal, fundi  not visualized, oral mucosa unremarkable.  NECK:  No jugular venous distention, wave form within normal limits, carotid  upstroke brisk and symmetric, no bruits or no thyromegaly.  LYMPHATICS:  No cervical, axillary, inguinal adenopathy.  LUNGS:  Clear to auscultation bilaterally.  BACK:  No costovertebral angle tenderness.  CHEST:  Unremarkable.  HEART:  PMI not displaced, S1 and S2 within normal limits.  No S3, no S4,  brief apical systolic murmur with early peaking, no diastolic murmurs.  ABDOMEN:  Flat, positive bowel sounds, ___________, no bruits, rebound,  guarding, no midline pulsatile mass or organomegaly.  SKIN:  No rash.  EXTREMITIES:  Extremities 2+ pulses, no edema, no cyanosis, no clubbing.  NEUROLOGIC:  Grossly intact.   DIAGNOSTIC STUDIES:  EKG sinus rhythm, possible right atrial enlargement,  possible left atrial enlargement, intervals otherwise within normal limits,  no acute ST-wave  changes.   LABORATORY DATA:  Point of care markers peaked troponin 0.06, otherwise  unremarkable.  WBC 7.5, hemoglobin 14.3, platelets 257.  Sodium 134,  potassium 4.0, BUN 10, creatinine 0.8.  Chest x-ray, no edema, _________  disease.   ASSESSMENT AND PLAN:  1.  Chest pain.  The patient's chest pain is most likely related to her      atrial fibrillation with rapid ventricular response.  She had a ver      slightly elevated troponin as risk factors.  We will go ahead and      exclude coronary disease by continuing to cycle enzymes and she will      need a Cardiolite which can be done as an outpatient.  2.  Atrial fibrillation.  We are going to educate the patient about Coumadin      and this rhythm.  I have discussed that she is at risk for stroke and      would have an indication for long-term anticoagulation.  She will also      be started on Cardizem p.o. rate control.  She will need a TSH checked.      An echocardiogram is planned.  3.  Risk reduction.  The patient needs to stop smoking.  She will educated      about this.  I will defer management of her lipids to her primary.  With      her diabetes she needs an LDL in the 70s and HDL in the 50s as targets.  4.  Hypertension.  Blood pressure is controlled.  5.  Followup.  Followup will be in our office pending the results of the      Cardiolite.           ______________________________  Rollene Rotunda, M.D.     JH/MEDQ  D:  06/15/2005  T:  06/15/2005  Job:  [id number]   cc:   DR. Genevie Cheshire _____

## 2011-01-20 NOTE — Discharge Summary (Signed)
NAMERION, SCHNITZER NO.:  [id number]   MEDICAL RECORD NO.:  [id number]          PATIENT TYPE:  INP   LOCATION:  6531                         FACILITY:  [facility]   PHYSICIAN:  Charlton Haws, M.D.     DATE OF BIRTH:  08-06-1941   DATE OF ADMISSION:  01/21/2006  DATE OF DISCHARGE:  01/23/2006                           DISCHARGE SUMMARY - REFERRING   SUMMARY OF HISTORY:  Ms. Ackert is a 70 year old female with a history of  PAF who presented to the emergency room complaining of chest discomfort and  palpitations.  She was awakened at approximately 10:11 p.m. with these  symptoms.  She also experienced some mild shortness of breath but denied  nausea, vomiting, diaphoresis or syncope, radiation.  She did admit to  fatigue.  Her husband brought her to the emergency room.  She was found to  be in atrial fibrillation with a rapid ventricular rate.  She was started on  IV Cardizem and was admitted to the hospital.  She has a history of PAF.  Her last presentation was in January 2007.  She has had four hospital or ER  evaluations related to this since October 2006.d Due to positive troponin, a  cardiac catheterization revealed nonobstructive coronary artery disease.  She also has a history of hypertension, diabetes, dyslipidemia,  hypothyroidism, panic attacks, appendectomy, tobacco and alcohol use.   LABORATORY:  Chest x-Ray on admission did not show any acute disease.  Initial EKG showed atrial fibrillation with ventricular rate of 121.  Subsequent EKG showed sinus bradycardia, normal sinus rhythm.  On admission,  H&H was 16.7 and 49.0, normal indices, platelets 14.7, WBCs 7.8.  Prior to  discharge, H&H was 13.4 and 39.4, normal indices, platelets of 227, WBCs  5.3. PTT 12.6.  Sodium 133, potassium 5.2, BUN 12, creatinine 0.7, glucose  165.  Normal LFTs except for a total bilirubin that was slightly elevated at  1.5.  CK-MBs and troponins were negative x4.  BNP was slightly  elevated at  143.  Fasting lipid showed a total cholesterol 222, triglycerides 101, HDL  59, LDL elevated at 143.  TSH was slightly low at 0.143.  Urine drug screen  was negative.  Urinalysis was positive for blood and protein.   HOSPITAL COURSE:  Ms. Thornley was admitted to 6500.  She was placed on IV  Cardizem with control of her ventricular rate.  After a ventricular rate  control, she spontaneously converted to normal sinus rhythm.  On Jan 24, 2006, Dr. Antoine Poche reassessed the patient, and it was felt that she has  reoccurring episodes after she consumes beer.  Dr. Antoine Poche spoke with her  and asked her not to consume any alcohol.  Dr. Antoine Poche also felt that if  she has recurring atrial fibrillation, she should come to the ER and be  treated with 300 mg of p.o. flecainide as a bolus, observe for five hours,  and if the patient converts to normal sinus rhythm within those five hours  in the emergency room, she should be given a home dose of flecainide of 300  mg  that she may take as needed with reoccurring symptoms.  This was  explained to the patient, and she was asked to tell the nurse or physician  on presentation to refer to this discharge note as to plan to treat her  atrial fibrillation.   DISCHARGE DIAGNOSES:  1.  Recurrent proximal atrial fibrillation with rapid ventricular rate,      conversion to normal sinus rhythm.  2.  Tobacco, EtOH use.  3.  Hypertension.  4.  Hyperlipidemia.  5.  Low TSH with a history of hypothyroidism.   DISPOSITION:  She is discharged home and asked to maintain low salt and fat  cholesterol ADA diet.  Her activity was not restricted.  She was asked to  bring all medications to all appointments.  Her medications remain unchanged  at this time, and they include:  1.  Diltiazem ER 240 mg q.d.  2.  Metformin 500 mg q.d.  3.  Synthroid 0.1 mg q.d.  4.  Lotrel 10/20 mg q.d.  5.  Aspirin 81 mg q.d.  6.  Altace 10 mg b.i.d.  7.  Ativan 0.5 as  needed.  8.  Lexapro 10 mg q.d.  9.  Metoprolol 50 mg b.i.d.  10. Vitamin B12 shots monthly.   She was asked to follow up with Dr. Antoine Poche as needed and given the phone  number.  She was asked to schedule a two-week appointment with  _____________, nurse practitioner, in regards to her low TSH and that her  Synthroid may need to be slightly decreased.  She was advised no alcohol,  tobacco products or smoking.  With her history of atrial fibrillation, she  was also advised no pseudoephedrine products.   Discharge time less than 30 minutes.      Joellyn Rued, P.A. [facility]    ______________________________  Charlton Haws, M.D.    EW/MEDQ  D:  01/23/2006  T:  01/23/2006  Job:  [id number]   cc:   Rollene Rotunda, M.D.  [address]  [address]  [place]  [place] [address]

## 2011-01-20 NOTE — Consult Note (Signed)
[name], [name]:  [id number]   MEDICAL RECORD NO.:  [id number]          Brady TYPE:  INP   LOCATION:  3729                         FACILITY:  [facility]   PHYSICIAN:  Jonelle Sidle, M.D. LHCDATE OF BIRTH:  May 14, 1941   DATE OF CONSULTATION:  09/17/2005  DATE OF DISCHARGE:                                   CONSULTATION   REFERRING PHYSICIAN:  Titus Dubin. Alwyn Ren, M.D. [facility].   PRIMARY CARDIOLOGIST:  Rollene Rotunda, M.D.   REASON FOR CONSULTATION:  [name] is a 70 year old female, with prior  history of paroxysmal atrial fibrillation associated with mildly elevated  troponin markers, with subsequent cardiac catheterization revealing  nonobstructive coronary artery disease with normal left ventricular  function, who now returns to Erin emergency room with recurrent paroxysmal  atrial fibrillation with rapid ventricular response.   When initially seen by our team in October of last year, [name]  presented with no prior cardiac history, but with multiple cardiac risk  factors notable for hypertension, hyperlipidemia, type 2 diabetes mellitus,  longstanding tobacco smoking, and age.  She presented with paroxysmal atrial  fibrillation which successfully converted to normal sinus rhythm after  treatment with IV Diltiazem.  Her cardiac enzymes were abnormal (troponin  0.28) and she was referred for cardiac catheterization revealing  nonobstructive coronary artery disease and normal left ventricular function.   [name] was initially seen in consultation by Rollene Rotunda, M.D., who,  given her multiple risk factors for stroke, recommended Coumadin  anticoagulation.  However, [name] deferred at that time.  She was  discharged on Cardizem 240 daily.   [name] reports that this if Erin third episode of palpitations which has  been documented as paroxysmal atrial fibrillation.  In between these two  hospitalizations, [name] was briefly seen  in Erin emergency room on  November 23, and subsequently discharged in normal sinus rhythm.  This  recent episode occurred sometime after having supper yesterday, and given  Erin persistent nature of Erin palpitations, she decided to come to Erin  emergency room.  She did report some mild associated chest pressure with Erin  palpitations and took one nitroglycerin tablet at home, but with no  associated relief.  She presented to Erin emergency room in PAF and was  treated with a 20 mg bolus of IV Diltiazem and subsequently placed on a  drip.  She converted to normal sinus rhythm at approximately 5 a.m. wherein  she remains at this time.  A 12-lead electrocardiogram confirms NSR with  first degree AV block.   [name] reports to me that she is quite aware of when she is in Erin  dysrhythmia; moreover, her chest discomfort completely resolved after  converting to normal sinus rhythm.  Serial cardiac enzymes have all been  within normal limits.   ALLERGIES:  PENICILLIN.   MEDICATIONS:  1.  Synthroid (unknown dose).  2.  Altace 10 daily.  3.  Metformin 500 b.i.d.  4.  Blood pressure medication.  5.  Aspirin 81 daily.   PAST MEDICAL HISTORY:  1.  Nonobstructive coronary  artery disease.      1.  40% left anterior descending/50% right coronary artery disease by          cardiac catheterization in July 07, 2005.      2.  Normal left ventricular function.  2.  Paroxysmal atrial fibrillation.      1.  Associated abnormal troponins in 07/07/2005.      2.  Successfully converted to normal sinus rhythm after treatment with          IV Diltiazem.  3.  Hypothyroidism.  4.  Hypertension.  5.  Type 2 diabetes mellitus.  6.  Hyperlipidemia.  7.  Longstanding tobacco.  8.  History of panic attacks.  9.  Status post appendectomy.   SOCIAL HISTORY:  [name] lives here in [place] with her husband.  She  has three children.  She reports a 30-35 pack-year history of tobacco  smoking.   She also drinks at least a few beers on a daily basis.  Regarding  caffeine, she admits to drinking several cups of coffee as well as what  sounds like a pitcher of tea a day.  She also eats a bit of chocolate.  Of  note, [name] also reports being under considerable stress given Erin  death of her mother just a few months ago.   FAMILY HISTORY:  Her mother died at age 62, this past July 08, 2023, from  complications of breast cancer.  Father died at Erin age of 56 with question  of poisoning.  [name] is an only child.   REVIEW OF SYSTEMS:  Prior to this admission, [name] denies any  exertional chest pain, dyspnea, PND, orthopnea, lower extremity edema, or  presyncope.  Remaining systems negative.   PHYSICAL EXAMINATION:  VITAL SIGNS:  Blood pressure 150/80, pulse 66,  currently regular, respirations 18, temperature 97.6, saturations 99% on  room air.  GENERAL:  A 70 year old female in no acute distress.  HEENT:  Normocephalic and atraumatic.  NECK:  Preserved bilateral carotid pulses without bruits; no JVD at 60  degrees.  LUNGS:  Clear to auscultation in all fields.  HEART:  Regular rate and rhythm (S1 and S2), soft grade 1-2/6 holosystolic  murmur in Erin upper left sternal border.  No gallops and no rubs.  ABDOMEN:  Soft and nontender with intact bowel sounds.  EXTREMITIES:  Preserved bilateral femoral pulses and distal pulses without  edema.  NEUROLOGY:  No focal deficit.   Admission chest x-ray; no acute disease.   Electrocardiogram; first degree atrioventricular block at 68 BPM with normal  axis and nonspecific ST abnormalities.   LABORATORY DATA:  Normal CBC.  Potassium 4.3, BUN 13, creatinine 0.8,  glucose 221.  Elevated total bilirubin 1.3, otherwise normal.  Liver  enzymes; INR was 0.9.  TSH 2.04.  Cardiac enzymes (POC); MB 1.6, less than  1.0; troponin I less than 0.05 (2).  IMPRESSION:  [name] is a 70 year old female with previous history of  paroxysmal  atrial fibrillation, successfully converted to normal sinus  rhythm with IV Diltiazem, who now presents with her third documented episode  of paroxysmal atrial fibrillation.  In all cases, these episodes have been  less than 24 hours in duration.   [name] also has a history of nonobstructive coronary artery disease by  cardiac catheterization in 07-Jul-2005.  She has normal left ventricular  function both by catheterization as well as a two-dimensional  echocardiogram.  Of note, her left  atrium was normal.   [name] was advised previously by Dr. Antoine Poche regarding increased risks  of stroke, given her multiple risk factors; however, she refused to be put  on Coumadin when we last saw her in October of last year.   PLAN:  [name] has once again successfully converted to normal sinus  rhythm after being treated with IV Diltiazem.  We will therefore resume oral  Diltiazem initially at 60 mg p.o. q.i.d. and convert to 240 mg daily in Erin  morning.  Of note, she was discharged on this dose back in October.  We once  again spoke to her about her increased risk of stroke given her multiple  risk factors for stroke and Erin fact that she has had documented, recurrent  PAF.  Once again, however, she is disinclined to initiate Coumadin  anticoagulation.  We will therefore defer this to Dr. Antoine Poche, with whom  [name] will need to follow up with following discharge tentatively in  Erin morning.   Regarding current medication regimen, we will increase aspirin to full dose  and initiate Cardizem 60 mg p.o. q.i.d. and change to 240 daily in Erin  morning.  [name] is also advised to significantly reduced her intake of  caffeine as well as to decrease her alcohol.  She is also strongly urged to  stop smoking tobacco.   Given that [name] had a recent normal echocardiogram this past October,  we do not feel that she needs a repeat study at this time.      Gene Serpe,  P.A. [facility]    ______________________________  Jonelle Sidle, M.D. [facility]    GS/MEDQ  D:  09/17/2005  T:  09/18/2005  Job:  [id number]

## 2011-01-20 NOTE — Op Note (Signed)
NAMELEIGHTYN, [name]               ACCOUNT NO.:  [id number]   MEDICAL RECORD NO.:  [id number]          PATIENT TYPE:  AMB   LOCATION:  DSC                          FACILITY:  [facility]   PHYSICIAN:  Currie Paris, M.D.DATE OF BIRTH:  Aug 04, 1941   DATE OF PROCEDURE:  04/02/2006  DATE OF DISCHARGE:                                 OPERATIVE REPORT   PREOP DIAGNOSIS:  Lipoma top of right shoulder.   POSTOPERATIVE DIAGNOSIS:  Lipoma top of right shoulder.   OPERATION:  Excision lipoma right shoulder.   SURGEON:  Dr. Jamey Ripa.   ANESTHESIA:  Local.   CLINICAL HISTORY:  [name] has a small soft mass on the top of her right  shoulder that she thought was getting slightly larger and a little bit  uncomfortable and she wished to have this removed.   DESCRIPTION OF PROCEDURE:  The patient was seen in the minor procedure room.  She and I both identified the mass in question and it was marked.  We  confirmed that and then prepped the area with some alcohol and anesthetized  it with 1% Xylocaine with epi.   I waited about 10 minutes and we got both good anesthesia and good  hemostasis.   The area was prepped with Betadine and draped.  I made a incision directly  over the mass and sharply excised it.  Appeared to be  a lobulated fatty  tissue consistent with a lipoma.  I went down to the muscle fascia but did  not take fascia as this seemed to be fairly well encapsulated.   The specimen was sent to Pathology.  I made sure everything was dry and then  closed the incision in layers with 3-0 Vicryl followed by 4-0 Monocryl  subcuticular and some Dermabond.   The patient tolerated the procedure well with no operative complications.      Currie Paris, M.D.  Electronically Signed     CJS/MEDQ  D:  04/02/2006  T:  04/02/2006  Job:  [id number]

## 2011-01-20 NOTE — Discharge Summary (Signed)
NAMEMarland Kitchen  CHIQUETTA, LANGNER NO.:  [id number]   MEDICAL RECORD NO.:  [id number]          PATIENT TYPE:  INP   LOCATION:  3709                         FACILITY:  [facility]   PHYSICIAN:  Wanda Plump, MD [facility]    DATE OF BIRTH:  1940/11/17   DATE OF ADMISSION:  06/15/2005  DATE OF DISCHARGE:  06/17/2005                                 DISCHARGE SUMMARY   PRIMARY CARE Mykelti Goldenstein:  Myer Peer, nurse practitioner at [facility].   ADMITTING DIAGNOSIS:  Atrial fibrillation and chest pain.   DISCHARGE DIAGNOSES:  1.  Non-Q wave myocardial infarction with cardiac catheterization showing      nonobstructive coronary artery disease with blockages ranging from 20 up      to 50%, normal ejection fraction.  Plan for medical therapy and risk      factor modification.  2.  Transient atrial fibrillation with rapid ventricular response, stable on      p.o. Cardizem and no Coumadin, echo was essentially negative except for      moderate septum hypertrophy.  3.  Hypothyroidism with normal TSH.  4.  Hypertension which is stable.  5.  Slightly elevated cholesterol with an LDL of 114, will be sent home with      statin.  6.  Diabetes with a1c of 7.3, blood sugar ranging from 118-183 on no      medication prior to admission, to our knowledge.  She will be started on      Glucophage.   BRIEF HISTORY AND PHYSICAL:  Ms. Brasil is a 70 year old female who  presented to the emergency room complaining of increased stress due to the  recent loss of her mother.  Prior to admission she felt chest pressure and a  funny feeling on her chest.  EMS was called, in the emergency room she was  found to have atrial fibrillation with rapid ventricular response.  She was  treated with IV Cardizem which controlled the ventricular response.   PHYSICAL EXAMINATION:  CHEST:  Clear to auscultation.  CARDIOVASCULAR:  Irregularly/irregular.  ABDOMEN:  Normal.   LABORATORY AND X-RAYS:  TSH 1.0,  A1c 7.3, lipid profile show a total  cholesterol 191, HDL 55, LDL 114.  CBC show a white count of 7.5, hemoglobin  14.3, and platelets of 257.  AST and ALT drawn a month ago were normal.  Cardiac enzymes showed two positive troponin tests with negative CKs.  Chest  x-ray was only positive for peribronchial thickening.   HOSPITAL COURSE:  Ms. Vannatter was admitted to telemetry, cardiology was  consulted, a 2D echo show ejection fraction of 55-65% with moderate vasal  septal hypertrophy, cardiac catheterization was performed on June 16, 2005, it showed nonobstructive coronary artery disease as well as trace  increase of the left ventricular end-diastolic pressure.  Please see the  report for details.  During this admission she was also noticed to be  depressed however, the patient declined any medication for depression.  At  the time of discharge she was asymptomatic, feeling much better and wanting  to  go home.  At this point I think she has reached maximum hospital benefit  and we will discharge her with the following instructions:  1.  Zocor 20 one p.o. daily.  This is a new medication for the patient and      she is aware that if she develops muscle cramps, she is to stop the      medication.  2.  Glucophage 500 one p.o. b.i.d.  This is also a new medication, she was      instructed about low blood sugar symptoms.  3.  Aspirin 325 mg one a day.  4.  Synthroid as before.  5.  Diltiazem 240 one p.o. daily.  6.  Altace 10 one p.o. daily.  7.  She was previously on Lotrel, but we recommend her to stop that.  8.  She is to call Myer Peer her primary care Larra Crunkleton for an appointment.  9.  She is advised to go to the emergency room if the chest pain resurface.  10. I will defer to the primary care Xue Low, counseling about tobacco.      Wanda Plump, MD [facility]  Electronically Signed     JEP/MEDQ  D:  06/17/2005  T:  06/17/2005  Job:  [id number]   cc:   Myer Peer, NP  [facility]

## 2011-01-20 NOTE — H&P (Signed)
NAME:  Erin, [name]:  [id number]   MEDICAL RECORD NO.:  [id number]          PATIENT TYPE:  EMS   LOCATION:  MAJO                         FACILITY:  [facility]   PHYSICIAN:  Corwin Levins, M.D. LHCDATE OF BIRTH:  Mar 10, 1941   DATE OF ADMISSION:  06/15/2005  DATE OF DISCHARGE:                                HISTORY & PHYSICAL   CHIEF COMPLAINT:  Came to the ER with atrial fibrillation with rapid  ventricular response and chest discomfort.   HISTORY OF PRESENT ILLNESS:  [name] is a 70 year old black female whose  mother died last week, with increased stress over the past year, drinking  coffee last p.m. on top of that, who last p.m. after went to bed had an  onset of chest pressure and some funny feeling in the chest.  Her husband  called EMS.  She was brought to the ER and found to have atrial fibrillation  with rapid ventricular response, apparently a new diagnosis.  She had a  similar episode of symptoms two days ago but much less severe and minutes  only.  She was treated with IV Cardizem and now heart rate in the 100s, no  discomfort, shortness of breath or other symptoms.  She is to be admitted  for further evaluation and management.   PAST MEDICAL HISTORY:  1.  Hypercholesterolemia.  2.  Hypertension.  3.  Hypothyroid.  4.  Recent anxiety.   SURGERIES:  Status post appendectomy.   ALLERGIES:  PENICILLIN.   CURRENT MEDICATIONS:  1.  Altace 10 mg p.o. daily.  2.  Lotrel 10/20 mg one p.o. daily.  3.  Synthroid 0.05 mg p.o. daily, although she is not sure of the dose.   SOCIAL HISTORY:  Tobacco up to three packs a day.  Alcohol:  Occasional  beer.  She lives in [place] with her husband.   FAMILY HISTORY:  Mother with breast cancer.   REVIEW OF SYSTEMS:  Otherwise noncontributory.   PHYSICAL EXAMINATION:  GENERAL:  [name] is a 70 year old black female.  VITAL SIGNS:  O2 saturation 98%, blood pressure 148/90, respirations 20,  heart rate  initially 142, now down to 100, approximately 105, with Cardizem  IV.  HEENT:  Sclerae clear.  TMs clear.  Pharynx benign.  NECK:  Without lymph nodes or JVD, thyromegaly.  CHEST:  No rales or wheezing.  CARDIAC:  Irregularly irregular, somewhat tachycardic.  ABDOMEN:  Soft with positive bowel sounds.  EXTREMITIES:  No edema.   LABORATORY DATA:  White blood cell count 7.5, hemoglobin 14.3.  Electrolytes:  Sodium 144, otherwise within normal limits.  BUN 10,  creatinine 0.7.  Glucose 206.  Initial CPK-MB less than 1.0, troponin I less  than 0.05.  ECG #1:  Atrial fibrillation with rate 150 with nonspecific ST-T  wave changes.  ECG #2 after IV Cardizem:  Atrial fibrillation with a rate  approximately 105, nonspecific ST-T wave changes.   ASSESSMENT AND PLAN:  1.  Atrial fibrillation with rapid ventricular response, improved after IV      Cardizem with decreased rate and symptoms, otherwise  overall stable.      She is to be admitted, continue telemetry, O2, rule out MI with cardiac      enzymes further.  Check 2-D echocardiogram as well as thyroid markers on      Synthroid.  She will need cardiology consult for the question of direct      cardiac conversion candidacy with a good history of response of symptoms      less than 24 hours.  Start Ecotrin 325 mg one daily.  2.  Chest pain, rule out myocardial infarction with cardiac enzymes as      above.  3.  Hypertension and hypothyroidism.  Continue home medications.  4.  Hypercholesterolemia, likely needs statin.  Defer for now.  5.  Elevated blood sugar, possibly new-onset diabetes, incidentally found.      Check CBG, apply sliding scale insulin.  Check hemoglobin A1c to assess      overall status.           ______________________________  Corwin Levins, M.D. [facility]     JWJ/MEDQ  D:  06/15/2005  T:  06/15/2005  Job:  [id number]   cc:   Rondel Jumbo, M.D.  [place], [facility]

## 2011-01-20 NOTE — Discharge Summary (Signed)
[name], [name]               ACCOUNT NO.:  [id number]   MEDICAL RECORD NO.:  [id number]          PATIENT TYPE:  INP   LOCATION:  3729                         FACILITY:  [facility]   PHYSICIAN:  Rene Paci, M.D. LHCDATE OF BIRTH:  08/31/1941   DATE OF ADMISSION:  09/16/2005  DATE OF DISCHARGE:  09/18/2005                                 DISCHARGE SUMMARY   DISCHARGE DIAGNOSES:  1.  Paroxysmal atrial fibrillation with rapid ventricular response.  2.  Diabetes type 2.  3.  Hypothyroidism.  4.  Hypertension.  5.  Depression/anxiety.   HISTORY OF PRESENT ILLNESS:  The patient is a 70 year old female with new  onset atrial fibrillation with chest pain that resolved.  The patient was  seen in the emergency room and was noted to have a heart rate on admission  of 140.  The patient was admitted for paroxysmal atrial fibrillation and  anticoagulation.   PAST MEDICAL HISTORY:  1.  Hypertension.  2.  Diabetes mellitus type 2.  3.  Hypothyroidism.   HOSPITAL COURSE:  PROBLEM #1:  PAROXYSMAL ATRIAL FIBRILLATION, RAPID  VENTRICULAR RESPONSE:  The patient was admitted and placed on a Cardizem  drip.  A cardiology consult was obtained and the patient was seen by Dr.  Rollene Rotunda.  The patient was placed on intravenous heparin, however,  refuses longterm Coumadin therapy as she feels that she is not responsible  enough and would be putting herself at risk.  The patient will be  discharged home on p.o. Cardizem.  She is currently in normal sinus rhythm  with heart rate in the 60's on telemetry.   PROBLEM #2:  DIABETES TYPE 2:  The patient was maintained on sliding scale  insulin; metformin was held during this hospitalization secondary to  possibility of needing contrast during this admission.  Metformin is to be  resumed upon discharge.   PROBLEM #3:  HYPOTHYROID:  The patient was maintained on her outpatient dose  of Synthroid.  TSH was 2.043 this admission.   PROBLEM #4:   HYPERTENSION:  The patient's blood pressure was noted to be  elevated during this hospitalization.  We will increase Altace to 10 mg p.o.  b.i.d. and continue Cardizem for atrial fibrillation.   PROBLEM #5:  DEPRESSION/ANXIETY:  Upon further discussion with patient, she  reports that she has had a long standing history of anxiety which is  accompanied by anxiety attacks.  This anxiety has become worse after the  death of her mother who suffered from breast cancer.  She is also caring for  her husband who has multiple medical problems and she states that her  anxiety attacks have become more frequent and more severe.  She also states  that she has felt quite depressed.  She is agreeable to starting low dose of  Lexapro, however, she will need outpatient monitoring. She will also be  given a prescription for PRN Ativan.   LABORATORY DATA:  Laboratories at discharge include hemoglobin 12.9,  hematocrit 38.3, BUN 13, creatinine 0.8.   DISCHARGE MEDICATIONS:  1.  Lexapro 10 mg p.o. daily.  2.  Ativan 0.5 mg q.8h. as needed for anxiety attacks.  3.  Altace 10 mg p.o. b.i.d.  4.  Synthroid.  The patient is to resume her preadmission dosing.  5.  Cardizem CD 240 mg p.o. daily.  6.  Metformin 500 mg once daily.  7.  Aspirin 325 mg p.o. daily.   FOLLOW UP:  The patient is instructed to follow up with Billy Bean in one to  two weeks and call for an appointment.  She is to call Genevie Cheshire Bean if she  develops heart palpitations or shortness of breath, or go directly to the  emergency room.      Melissa S. Peggyann Juba, NP      Rene Paci, M.D. [facility]  Electronically Signed    MSO/MEDQ  D:  09/18/2005  T:  09/18/2005  Job:  [id number]   cc:   Marlise Eves, [place], [address]

## 2011-01-20 NOTE — H&P (Signed)
Erin Kitchen  Brady, [name]:  [id number]   MEDICAL RECORD NO.:  [id number]          PATIENT TYPE:  INP   LOCATION:  1823                         FACILITY:  [facility]   PHYSICIAN:  Denyse Amass, MD DATE OF BIRTH:  May 05, 1941   DATE OF ADMISSION:  02/22/2006  DATE OF DISCHARGE:                                HISTORY & PHYSICAL   CHIEF COMPLAINT:  Palpitations.   HISTORY OF PRESENT ILLNESS:  The patient is a 70 year old female known to  [facility] with a history of paroxysmal atrial fibrillation,  hypertension, and type 2 diabetes, who had chest pain today, followed by  palpitations, and typical of prior exacerbations of atrial fibrillation. In  the emergency room her atrial fibrillation was initially with biventricular  response,  with a slow ventricular response in the 80s. Her chest pain has  since resolved. Workup in the emergency room was significant for a potassium  of 3.1, negative cardiac enzymes, and an unremarkable white count and  hematocrit.   PAST MEDICAL HISTORY:  1.  In October 2006 nonobstructive coronary artery disease with positive      troponins and in October 2006 abnormal echo.  2.  Hypothyroidism.  3.  Hypertension.  4.  Type 2 diabetes.  5.  Hyperlipidemia.   ALLERGIES:  NO KNOWN DRUG ALLERGIES.   MEDICATIONS:  Diltiazem, Lotrel, metformin, and Synthroid.   SOCIAL HISTORY:  Lives locally with her husband. Greater than 20-pack year  smoking history as well as alcohol history. Denies drugs.   FAMILY HISTORY:  Mother died at age 49 from breast cancer.   REVIEW OF SYSTEMS:  CONSTITUTIONAL:  Denies fever, chills, sweats, or weight  change.  HEENT:  No headaches, bleeds, voice changes, vertigo, photophobia,  vision or hearing loss, or dentition issues. SKIN: No rashes or lesions.  CARDIOPULMONARY: Per HPI.  GU: Denies frequency, urgency, or dysuria.  NEUROPSYCHIATRIC:  Denies weakness, numbness, mood disturbances, depression,  or  anxiety.  MUSCULOSKELETAL:  Denies arthralgias, joint deformities, or  pain. GI:  No nausea, vomiting, diarrhea, bright red blood per rectum,  melena, hematemesis, odynophagia, GERD symptoms, abdominal pain, or bowel  habit changes. ENDOCRINE: No polyuria, polydipsia, heat or cold intolerance,  skin or hair changes.   PHYSICAL EXAMINATION:  VITAL SIGNS: Temperature 98.7,  pulse 84, respiratory  rate 14, blood pressure 130/74.  GENERAL: She is a pleasant woman in no apparent distress.  HEENT:  Normocephalic and atraumatic. His pupils are round, reactive to  light minimally.  Extraocular motions are intact.  NECK: Supple with no lymphadenopathy. Jugular venous pulsations are normal  at 30 degrees.  LYMPHADENOPATHY:  None.  CARDIOVASCULAR: S1 and S2, irregularly irregular. No murmurs are  appreciated.  Pulses appear 2+ throughout. Aside from their irregularity  there are normal in contour.  LUNGS: Clear to auscultation without adventitious sounds.  SKIN: No rashes or lesions.  ABDOMEN: Soft and nontender with positive bowel sounds.  RECTAL EXAM: Heme negative.  EXTREMITIES: No clubbing, cyanosis, or edema.  MUSCULOSKELETAL:  No joint deformities.  NEUROLOGIC: Alert and oriented times three. Cranial nerves II-XII are  intact. Strength is 5/5, moves all extremities.   EKG shows atrial fibrillation in the 80s, with no significant injury  occurring. LFTs unremarkable.   ASSESSMENT:  This is a 70 year old female with no history of coronary artery  disease, normal ejection fraction, who presents with a paroxysmal atrial  fibrillation.  Per Dr. Jenene Slicker last note the patient was to return in  atrial fibrillation. He desired a loading dose of flecainide to be given and  for the patient to be observed, then discharged if in normal sinus rhythm.  We will initiate the flecainide now and initiation the observation.      Denyse Amass, MD     DBH/MEDQ  D:  02/23/2006  T:  02/23/2006   Job:  [id number]

## 2011-01-20 NOTE — H&P (Signed)
[name], [name]:  [id number]   MEDICAL RECORD NO.:  [id number]          PATIENT TYPE:  INP   LOCATION:  3729                         FACILITY:  [facility]   PHYSICIAN:  Titus Dubin. Alwyn Ren, M.D. [facility] OF BIRTH:  10-31-1940   DATE OF ADMISSION:  09/16/2005  DATE OF DISCHARGE:                                HISTORY & PHYSICAL   [name] is a 70 year old Afro-American admitted with uncontrolled atrial  fibrillation associated with chest pain. She presented with chest pain on a  7/10 scale for which she received nitroglycerin and also IV diltiazem for  the atrial fibrillation. Initially her rate was 163 but the pulse dropped as  low as 122 initially. She has had two sets of enzymes which are negative but  she remains in atrial fibrillation with a rapid ventricular response.   This is her third or fourth visit to the emergency room for this similar  problem. She expressed her desire to go home; I explained the significant  risk of stroke if the rate is not controlled and especially because of the  recurrence of atrial fibrillation.   PAST MEDICAL HISTORY:  1.  Non-insulin-dependent diabetes.  2.  Hypertension.  3.  Appendectomy.  4.  Tonsillectomy.  5.  Twelve pregnancies with one delivery.   She drinks beer, smokes a pack a day.   She is allergic to PENICILLIN.   There is no myocardial infarction or stroke in the family. Her brother had  diabetes. Mother had cancer of the breast, an aunt had ovarian cancer.   REVIEW OF SYSTEMS:  Reveals minor headache for which she takes baby aspirin.  She describes cough productive of light phlegm. She has had no GI or  genitourinary symptomatology.   PHYSICAL EXAMINATION:  VITAL SIGNS:  Temperature is 98.2, pulse was 138 and  irregular, blood pressure 156/77, respiratory rate was 18. O2 saturations  were 99% on 2 L.  HEENT:  Arteriolar narrowing is present on fundal exam. Nares are patent;  otic canals are  unremarkable. She had complete dentures. LYMPHATIC:  There  is no lymphadenopathy about head, neck, or axilla.  LUNGS:  She has mild rales with no increased work of breathing.  HEART:  There is an irregular rhythm with a flow murmur.  ABDOMEN:  Nontender without organomegaly or masses.  EXTREMITIES:  She has no edema. Pedal pulses are intact.  NEUROLOGIC:  There are no localizing neuropsychiatric deficits but she is  exhibiting denial. She wanted to know how long she would have to stay if she  were admitted. She stated on several occasions that they turned it around  in the emergency room before. When asked why she had not followed up with  cardiologist as recommended during her last ER visit, she stated that she  had been too involved with her mother's health as noted above.   She is now admitted with paroxysmal atrial fibrillation with rapid  ventricular response and diabetes. Her glucose is 221. Chest x-ray showed  borderline cardiomegaly. She will be placed on heparin protocol with  Protonix coverage and hemoccult monitoring.  Cardizem will be continued. She  will be seen in consultation by cardiology. TSH will be checked as she is on  Synthroid at unknown dose. Metformin will be held because of potential need  for contrast studies and because of the uncontrolled diabetes.      Titus Dubin. Alwyn Ren, M.D. [facility]  Electronically Signed     WFH/MEDQ  D:  09/17/2005  T:  09/18/2005  Job:  [id number]   cc:   Laurita Quint, M.D.  Fax: [phone]

## 2011-01-20 NOTE — Cardiovascular Report (Signed)
[name], [name]:  [id number]   MEDICAL RECORD NO.:  [id number]          PATIENT TYPE:  INP   LOCATION:  3709                         FACILITY:  [facility]   PHYSICIAN:  Jonelle Sidle, M.D. LHCDATE OF BIRTH:  1941/04/16   DATE OF PROCEDURE:  06/16/2005  DATE OF DISCHARGE:                              CARDIAC CATHETERIZATION   REFERRING CARDIOLOGIST:  Dr. Rollene Brady.   INDICATIONS:  [name] is a 70 year old woman with a history of  hypertension, hyperlipidemia, type 2 diabetes mellitus and recent  presentation with atrial fibrillation associated with a rapid ventricular  response and chest discomfort. She had minor elevation in troponin I levels  up to 0.28 and has now spontaneously converted to normal sinus rhythm. She  is referred now for diagnostic coronary angiography to exclude significant  obstructive coronary atherosclerosis and need for any revascularizations  strategies.   PROCEDURE PERFORMED:  1.  Left heart catheterization.  2.  Selective coronary angiography.  3.  Left ventriculography.   ACCESS/EQUIPMENT:  The area about the right femoral artery was anesthetized  with 1% lidocaine. A 6-French sheath was placed in the right femoral artery  via the modified Seldinger technique. Standard preformed 6-French JL-4 and  JR-4 catheters were used for selective coronary angiography. An angled  pigtail catheters was used for left heart catheterization ventriculography.  All exchanges were made over wire. The patient tolerated procedure well  without any complications.   HEMODYNAMIC RESULTS:  Left ventricle 169/28 mmHg. Aorta 170/75 mmHg.   ANGIOGRAPHIC FINDINGS:  1.  The left main coronary artery has minor luminal irregularities and is      moderate in size with approximately 20% midvessel stenosis. This vessel      gives rise to the left anterior descending and circumflex coronary      arteries.  2.  The left anterior descending is  small to medium in caliber and diffusely      diseased to approximately the 20% of the proximal segment and 40% in the      mid segment with other luminal irregularities noted. There are two      medium caliber proximal and mid diagonal branches both of which have      mild disease of approximately 20-30%.  3.  The circumflex coronary artery is a medium caliber vessel with two      obtuse marginal branches. Minor luminal irregularities are noted with      approximately 30% stenosis in the midcircumflex.  4.  The right coronary artery is a medium to large in caliber and dominant      with to large right ventricular marginal branches. There is a fairly      focal 50% stenosis in the mid right coronary artery and other minor      luminal irregularities.   Left ventriculography is was performed in the RAO projection. It revealed an  ejection fraction of approximately 75% in the setting of ventricular ectopy  with no focal wall motion abnormalities and trace mitral regurgitation.   DIAGNOSES:  1.  Nonobstructive coronary atherosclerosis as outlined above  including a      50% mid-right coronary stenosis and a 40% mid left anterior descending      stenosis with other mild scattered disease in diffuse pattern.  2.  Left ventricular ejection fraction approximately 75% with elevated left      end diastolic pressure of 28 mmHg and trace mitral regurgitation in the      setting of ventricular ectopy.   DISCUSSION:  I reviewed the results with the patient, her family and with  [name] with the Internal Medicine Service by phone. Plan at this point  would be for medical therapy and aggressive risk factor modification. No  clear indication for revascularizations at this time.           ______________________________  Jonelle Sidle, M.D. [facility]     SGM/MEDQ  D:  06/16/2005  T:  06/16/2005  Job:  [id number]   cc:   [name], M.D. [facility]  [address]  [place]  [place] [address]    [name], M.D.  [address]  [address]  [place]  [place] [address]

## 2011-01-23 ENCOUNTER — Encounter: Payer: Self-pay | Admitting: Internal Medicine

## 2011-01-24 ENCOUNTER — Encounter: Payer: Self-pay | Admitting: Internal Medicine

## 2011-01-24 ENCOUNTER — Ambulatory Visit (INDEPENDENT_AMBULATORY_CARE_PROVIDER_SITE_OTHER): Payer: Medicare Other | Admitting: Internal Medicine

## 2011-01-24 VITALS — BP 132/72 | HR 64 | Temp 98.6°F | Ht 64.0 in | Wt 135.0 lb

## 2011-01-24 DIAGNOSIS — E785 Hyperlipidemia, unspecified: Secondary | ICD-10-CM

## 2011-01-24 DIAGNOSIS — E039 Hypothyroidism, unspecified: Secondary | ICD-10-CM

## 2011-01-24 DIAGNOSIS — F1010 Alcohol abuse, uncomplicated: Secondary | ICD-10-CM

## 2011-01-24 DIAGNOSIS — E119 Type 2 diabetes mellitus without complications: Secondary | ICD-10-CM

## 2011-01-24 DIAGNOSIS — F17200 Nicotine dependence, unspecified, uncomplicated: Secondary | ICD-10-CM

## 2011-01-24 MED ORDER — ATORVASTATIN CALCIUM 20 MG PO TABS: 20.0000 mg | ORAL_TABLET | Freq: Every day | ORAL | Status: DC

## 2011-01-24 NOTE — Assessment & Plan Note (Signed)
Needs treatment---she is agreeable Trial lipitor

## 2011-01-24 NOTE — Progress Notes (Signed)
  Subjective:    Patient ID: [name], female    DOB: 09-15-1940, 70 y.o.   MRN: [id number]  HPI  patient comes in for followup of multiple medical problems including type 2 diabetes, hyperlipidemia, hypertension. The patient does not check blood sugar or blood pressure at home. The patetient does not follow an exercise or diet program. The patient denies any polyuria, polydipsia.  In the past the patient has gone to diabetic treatment center. The patient is tolerating medications  Without difficulty. The patient does admit to medication noncompliance.   Alcohol---at least 6 pack of beer daily Tobacco-at least 1ppd   Past Medical History  Diagnosis Date  . Atrial fibrillation   . COPD (chronic obstructive pulmonary disease)   . Hyperlipidemia   . Hypothyroidism   . Arthritis   . Diabetes mellitus   . History of EMG 11/00    negative   Past Surgical History  Procedure Date  . Ankle fracture surgery     left  . Thyroid colloid cyst  12/00  . Coronary angioplasty with stent placement 06/16/05    reports that she has been smoking Cigarettes.  She has been smoking about 1 pack per day. She does not have any smokeless tobacco history on file. She reports that she drinks about 3.6 ounces of alcohol per week. Her drug history not on file. family history includes Cancer in her mother. Allergies  Allergen Reactions  . Hydrochlorothiazide W/Triamterene     REACTION: palpitations  . Penicillins     REACTION: urticaria (hives)    Review of Systems  patient denies chest pain, shortness of breath, orthopnea. Denies lower extremity edema, abdominal pain, change in appetite, change in bowel movements. Patient denies rashes, musculoskeletal complaints. No other specific complaints in a complete review of systems.      Objective:   Physical Exam  Well-developed well-nourished female in no acute distress. HEENT exam atraumatic, normocephalic, extraocular muscles are intact. Neck is  supple. No jugular venous distention no thyromegaly. Chest clear to auscultation without increased work of breathing. Cardiac exam S1 and S2 are regular. Abdominal exam active bowel sounds, soft, nontender. Extremities no edema. Neurologic exam she is alert without any motor sensory deficits. Gait is normal.        Assessment & Plan:

## 2011-01-24 NOTE — Assessment & Plan Note (Signed)
Reviewed labs

## 2011-01-24 NOTE — Assessment & Plan Note (Signed)
Referred to AA She refuses to go She and husband understand risks

## 2011-01-24 NOTE — Assessment & Plan Note (Signed)
She needs to quit She refuses

## 2011-05-17 ENCOUNTER — Other Ambulatory Visit (INDEPENDENT_AMBULATORY_CARE_PROVIDER_SITE_OTHER): Payer: Medicare Other

## 2011-05-17 DIAGNOSIS — E119 Type 2 diabetes mellitus without complications: Secondary | ICD-10-CM

## 2011-05-17 LAB — BASIC METABOLIC PANEL
BUN: 12 mg/dL (ref 6–23)
Calcium: 9.4 mg/dL (ref 8.4–10.5)
Creatinine, Ser: 0.8 mg/dL (ref 0.4–1.2)
GFR: 94.73 mL/min (ref >60.00)
Glucose, Bld: 133 mg/dL — ABNORMAL HIGH (ref 70–99)
Sodium: 138 mEq/L (ref 135–145)

## 2011-05-17 LAB — HEPATIC FUNCTION PANEL
AST: 21 U/L (ref 0–37)
Alkaline Phosphatase: 68 U/L (ref 39–117)
Bilirubin, Direct: 0 mg/dL (ref 0.0–0.3)
Total Bilirubin: 0.6 mg/dL (ref 0.3–1.2)

## 2011-05-17 LAB — LIPID PANEL: Total CHOL/HDL Ratio: 3

## 2011-05-18 ENCOUNTER — Other Ambulatory Visit: Payer: Medicare Other

## 2011-05-21 ENCOUNTER — Other Ambulatory Visit: Payer: Self-pay | Admitting: Internal Medicine

## 2011-05-26 ENCOUNTER — Ambulatory Visit (INDEPENDENT_AMBULATORY_CARE_PROVIDER_SITE_OTHER): Payer: Medicare Other | Admitting: Internal Medicine

## 2011-05-26 ENCOUNTER — Encounter: Payer: Self-pay | Admitting: Internal Medicine

## 2011-05-26 DIAGNOSIS — I10 Essential (primary) hypertension: Secondary | ICD-10-CM

## 2011-05-26 DIAGNOSIS — F17200 Nicotine dependence, unspecified, uncomplicated: Secondary | ICD-10-CM

## 2011-05-26 DIAGNOSIS — E119 Type 2 diabetes mellitus without complications: Secondary | ICD-10-CM

## 2011-05-26 NOTE — Progress Notes (Signed)
  Subjective:    Patient ID: [name], female    DOB: September 23, 1940, 70 y.o.   MRN: [id number]  HPI  patient comes in for followup of multiple medical problems including type 2 diabetes, hyperlipidemia, hypertension. The patient does not check blood sugar or blood pressure at home. The patetient does not follow an exercise or diet program. The patient denies any polyuria, polydipsia.  In the past the patient has gone to diabetic treatment center. The patient is tolerating medications  Without difficulty. The patient does admit to medication compliance.  Still smoking cigarrettes and drinking a lot of beer.  Past Medical History  Diagnosis Date  . Atrial fibrillation   . COPD (chronic obstructive pulmonary disease)   . Hyperlipidemia   . Hypothyroidism   . Arthritis   . Diabetes mellitus   . History of EMG 11/00    negative   Past Surgical History  Procedure Date  . Ankle fracture surgery     left  . Thyroid colloid cyst  12/00  . Coronary angioplasty with stent placement 06/16/05    reports that she has been smoking Cigarettes.  She has been smoking about 1 pack per day. She does not have any smokeless tobacco history on file. She reports that she drinks about 30 ounces of alcohol per week. Her drug history not on file. family history includes Cancer in her mother. Allergies  Allergen Reactions  . Hydrochlorothiazide W/Triamterene     REACTION: palpitations  . Penicillins     REACTION: urticaria (hives)      Review of Systems  patient denies chest pain, shortness of breath, orthopnea. Denies lower extremity edema, abdominal pain, change in appetite, change in bowel movements. Patient denies rashes, musculoskeletal complaints. No other specific complaints in a complete review of systems.      Objective:   Physical Exam   Well-developed well-nourished female in no acute distress. HEENT exam atraumatic, normocephalic, extraocular muscles are intact. Neck is supple. No  jugular venous distention no thyromegaly. Chest clear to auscultation without increased work of breathing. Cardiac exam S1 and S2 are regular. Abdominal exam active bowel sounds, soft, nontender. Extremities no edema. Neurologic exam she is alert without any motor sensory deficits. Gait is normal.       Assessment & Plan:

## 2011-05-29 ENCOUNTER — Other Ambulatory Visit: Payer: Self-pay | Admitting: Internal Medicine

## 2011-06-04 NOTE — Assessment & Plan Note (Signed)
Improved control. Will continue current medications. She does need to abstain from all alcohol. She drinks at least 40-60 ounces of beer daily.

## 2011-06-04 NOTE — Assessment & Plan Note (Signed)
She understands the health benefits of quitting but refuses to participate in a smoking cessation therapy.

## 2011-07-17 ENCOUNTER — Other Ambulatory Visit: Payer: Self-pay | Admitting: *Deleted

## 2011-07-17 MED ORDER — METOPROLOL TARTRATE 50 MG PO TABS: 75.0000 mg | ORAL_TABLET | Freq: Every day | ORAL | Status: DC

## 2011-11-16 ENCOUNTER — Other Ambulatory Visit: Payer: Self-pay | Admitting: Internal Medicine

## 2011-11-17 ENCOUNTER — Other Ambulatory Visit (INDEPENDENT_AMBULATORY_CARE_PROVIDER_SITE_OTHER): Payer: Medicare Other

## 2011-11-17 DIAGNOSIS — E039 Hypothyroidism, unspecified: Secondary | ICD-10-CM

## 2011-11-17 DIAGNOSIS — E119 Type 2 diabetes mellitus without complications: Secondary | ICD-10-CM

## 2011-11-17 DIAGNOSIS — I10 Essential (primary) hypertension: Secondary | ICD-10-CM

## 2011-11-17 LAB — BASIC METABOLIC PANEL
BUN: 18 mg/dL (ref 6–23)
Chloride: 96 mEq/L (ref 96–112)
Potassium: 3.5 mEq/L (ref 3.5–5.1)

## 2011-11-17 LAB — HEPATIC FUNCTION PANEL
ALT: 21 U/L (ref 0–35)
Albumin: 3.9 g/dL (ref 3.5–5.2)
Total Bilirubin: 0.8 mg/dL (ref 0.3–1.2)
Total Protein: 6.8 g/dL (ref 6.0–8.3)

## 2011-11-17 LAB — LIPID PANEL
HDL: 67.5 mg/dL (ref >39.00)
VLDL: 26.8 mg/dL (ref 0.0–40.0)

## 2011-11-17 LAB — TSH: TSH: 0.48 u[IU]/mL (ref 0.35–5.50)

## 2011-11-17 LAB — HEMOGLOBIN A1C: Hgb A1c MFr Bld: 7.5 % — ABNORMAL HIGH (ref 4.6–6.5)

## 2011-11-23 ENCOUNTER — Encounter: Payer: Self-pay | Admitting: Internal Medicine

## 2011-11-23 ENCOUNTER — Ambulatory Visit (INDEPENDENT_AMBULATORY_CARE_PROVIDER_SITE_OTHER): Payer: Medicare Other | Admitting: Internal Medicine

## 2011-11-23 VITALS — BP 142/80 | HR 68 | Temp 98.2°F | Ht 64.0 in | Wt 136.0 lb

## 2011-11-23 DIAGNOSIS — F17200 Nicotine dependence, unspecified, uncomplicated: Secondary | ICD-10-CM

## 2011-11-23 DIAGNOSIS — E119 Type 2 diabetes mellitus without complications: Secondary | ICD-10-CM

## 2011-11-23 DIAGNOSIS — F1010 Alcohol abuse, uncomplicated: Secondary | ICD-10-CM

## 2011-11-23 DIAGNOSIS — E039 Hypothyroidism, unspecified: Secondary | ICD-10-CM

## 2011-11-23 DIAGNOSIS — I10 Essential (primary) hypertension: Secondary | ICD-10-CM

## 2011-11-23 DIAGNOSIS — J449 Chronic obstructive pulmonary disease, unspecified: Secondary | ICD-10-CM

## 2011-11-23 NOTE — Assessment & Plan Note (Signed)
I have discussed her alcohol use with her and her husband who is present today. I've advised complete alcohol cessation. I've given her information Alcoholics Anonymous.

## 2011-11-23 NOTE — Progress Notes (Signed)
Patient ID: [name], female   DOB: 1941/01/27, 71 y.o.   MRN: [id number]  patient comes in for followup of multiple medical problems including type 2 diabetes, hyperlipidemia, hypertension. The patient does not check blood sugar or blood pressure at home. The patetient does not follow an exercise or diet program. The patient denies any polyuria, polydipsia.  In the past the patient has gone to diabetic treatment center. The patient is tolerating medications  Without difficulty. The patient does admit to medication compliance.   She continues to smoke > 1ppd She continues to drink Past Medical History  Diagnosis Date  . Atrial fibrillation   . COPD (chronic obstructive pulmonary disease)   . Hyperlipidemia   . Hypothyroidism   . Arthritis   . Diabetes mellitus   . History of EMG 11/00    negative    History   Social History  . Marital Status: Married    Spouse Name: N/A    Number of Children: N/A  . Years of Education: N/A   Occupational History  . Not on file.   Social History Main Topics  . Smoking status: Current Everyday Smoker -- 1.0 packs/day    Types: Cigarettes  . Smokeless tobacco: Not on file  . Alcohol Use: 30.0 oz/week    50 Cans of beer per week  . Drug Use:   . Sexually Active:    Other Topics Concern  . Not on file   Social History Narrative  . No narrative on file    Past Surgical History  Procedure Date  . Ankle fracture surgery     left  . Thyroid colloid cyst  12/00  . Coronary angioplasty with stent placement 06/16/05    Family History  Problem Relation Age of Onset  . Cancer Mother     breast    Allergies  Allergen Reactions  . Hydrochlorothiazide W/Triamterene     REACTION: palpitations  . Penicillins     REACTION: urticaria (hives)    Current Outpatient Prescriptions on File Prior to Visit  Medication Sig Dispense Refill  . aspirin 81 MG tablet Take 81 mg by mouth daily.        Marland Kitchen atorvastatin (LIPITOR) 20 MG tablet Take  1 tablet (20 mg total) by mouth daily.  90 tablet  3  . diltiazem (CARDIZEM CD) 300 MG 24 hr capsule Take 300 mg by mouth daily.        Marland Kitchen levothyroxine (SYNTHROID, LEVOTHROID) 75 MCG tablet TAKE 1 TABLET BY MOUTH EVERY DAY  90 tablet  1  . metFORMIN (GLUCOPHAGE) 500 MG tablet TAKE 1 TABLET BY MOUTH TWICE A DAY  180 tablet  3  . metoprolol (LOPRESSOR) 50 MG tablet Take 1.5 tablets (75 mg total) by mouth daily.  90 tablet  5  . polyethylene glycol (MIRALAX / GLYCOLAX) packet Take 17 g by mouth daily.        . ramipril (ALTACE) 10 MG capsule TAKE ONE CAPSULE BY MOUTH TWICE A DAY  60 capsule  5  . triamterene-hydrochlorothiazide (MAXZIDE-25) 37.5-25 MG per tablet Take 1 tablet by mouth daily.           patient denies chest pain, shortness of breath, orthopnea. Denies lower extremity edema, abdominal pain, change in appetite, change in bowel movements. Patient denies rashes, musculoskeletal complaints. No other specific complaints in a complete review of systems.   BP 174/100  Pulse 68  Temp(Src) 98.2 F (36.8 C) (Oral)  Ht 5\' 4"  (1.626 m)  Wt 136 lb (61.689 kg)  BMI 23.34 kg/m2  Well-developed well-nourished female in no acute distress. HEENT exam atraumatic, normocephalic, extraocular muscles are intact. Neck is supple. No jugular venous distention no thyromegaly. Chest clear to auscultation without increased work of breathing. Cardiac exam S1 and S2 are regular. Abdominal exam active bowel sounds, soft, nontender. Extremities no edema. Neurologic exam she is alert without any motor sensory deficits. Gait is normal.

## 2011-11-23 NOTE — Assessment & Plan Note (Signed)
She understands the need to quit. 

## 2011-11-23 NOTE — Assessment & Plan Note (Signed)
Lab Results  Component Value Date   HGBA1C 7.5* 11/17/2011   Not as well-controlled as previously. I've asked her to increase her exercise, stop all alcohol. I think this will take care of a rising A1c.

## 2011-11-23 NOTE — Assessment & Plan Note (Signed)
BP Readings from Last 3 Encounters:  11/23/11 142/80  05/26/11 134/94  01/24/11 132/72   Only fair control. Will continue current medications. I like her to start exercising daily. I think with regular exercise, smoking cessation, alcohol cessation and modest weight loss she will not need additional antihypertensive medications.

## 2011-11-23 NOTE — Patient Instructions (Signed)
Stop smoking Stop all alcohol---call [facility] It can be hard to find the right place to get professional treatment. Here are some important things to consider:  There are different types of treatment to choose from.   Some programs are live-in (residential) while others are not (outpatient). Sometimes a combination is offered.   No single type of program is right for everyone.   Most treatment programs involve a combination of education, counseling, and a 12-step, spiritually-based approach.   There are non-spiritually based programs (not 12-step).   Some treatment programs are government sponsored. They are geared for patients without private insurance.   Treatment programs can vary in many respects such as:   Cost and types of insurance accepted.   Types of on-site medical services offered.   Length of stay, setting, and size.   Overall philosophy of treatment.  A person may need specialized treatment or have needs not addressed by all programs. For example, adolescents need treatment appropriate for their age. Other people have secondary disorders that must be managed as well. Secondary conditions can include mental illness, such as depression or diabetes. Often, a period of detoxification from alcohol or drugs is needed. This requires medical supervision and not all programs offer this. THINGS TO CONSIDER WHEN SELECTING A TREATMENT PROGRAM   Is the program certified by the appropriate government agency? Even private programs must be certified and employ certified professionals.   Does the program accept your insurance? If not, can a payment plan be set up?   Is the facility clean, organized, and well run? Do they allow you to speak with graduates who can share their treatment experience with you? Can you tour the facility? Can you meet with staff?   Does the program meet the full range of individual needs?   Does the treatment program  address sexual orientation and physical disabilities? Do they provide age, gender, and culturally appropriate treatment services?   Is treatment available in languages other than English?   Is long-term aftercare support or guidance encouraged and provided?   Is assessment of an individual's treatment plan ongoing to ensure it meets changing needs?   Does the program use strategies to encourage reluctant patients to remain in treatment long enough to increase the likelihood of success?   Does the program offer counseling (individual or group) and other behavioral therapies?   Does the program offer medicine as part of the treatment regimen, if needed?   Is there ongoing monitoring of possible relapse? Is there a defined relapse prevention program? Are services or referrals offered to family members to ensure they understand addiction and the recovery process? This would help them support the recovering individual.   Are 12-step meetings held at the center or is transport available for patients to attend outside meetings?  In countries outside of the [place]. and [place], Magazine features editor for contact information for services in your area. Document Released: 07/20/2005 Document Revised: 08/10/2011 Document Reviewed: 01/30/2008 [facility] Patient Information 2012 [place], [place].Smoking Cessation This document explains the best ways for you to quit smoking and new treatments to help. It lists new medicines that can double or triple your chances of quitting and quitting for good. It also considers ways to avoid relapses and concerns you may have about quitting, including weight gain. NICOTINE: A POWERFUL ADDICTION If you have tried to quit smoking, you know how hard it can be. It is hard because nicotine is a very addictive drug. For  some people, it can be as addictive as heroin or cocaine. Usually, people make 2 or 3 tries, or more, before finally being able to quit. Each time you try to quit, you  can learn about what helps and what hurts. Quitting takes hard work and a lot of effort, but you can quit smoking. QUITTING SMOKING IS ONE OF THE MOST IMPORTANT THINGS YOU WILL EVER DO.  You will live longer, feel better, and live better.   The impact on your body of quitting smoking is felt almost immediately:   Within 20 minutes, blood pressure decreases. Pulse returns to its normal level.   After 8 hours, carbon monoxide levels in the blood return to normal. Oxygen level increases.   After 24 hours, chance of heart attack starts to decrease. Breath, hair, and body stop smelling like smoke.   After 48 hours, damaged nerve endings begin to recover. Sense of taste and smell improve.   After 72 hours, the body is virtually free of nicotine. Bronchial tubes relax and breathing becomes easier.   After 2 to 12 weeks, lungs can hold more air. Exercise becomes easier and circulation improves.   Quitting will reduce your risk of having a heart attack, stroke, cancer, or lung disease:   After 1 year, the risk of coronary heart disease is cut in half.   After 5 years, the risk of stroke falls to the same as a nonsmoker.   After 10 years, the risk of lung cancer is cut in half and the risk of other cancers decreases significantly.   After 15 years, the risk of coronary heart disease drops, usually to the level of a nonsmoker.   If you are pregnant, quitting smoking will improve your chances of having a healthy baby.   The people you live with, especially your children, will be healthier.   You will have extra money to spend on things other than cigarettes.  FIVE KEYS TO QUITTING Studies have shown that these 5 steps will help you quit smoking and quit for good. You have the best chances of quitting if you use them together: 1. Get ready.  2. Get support and encouragement.  3. Learn new skills and behaviors.  4. Get medicine to reduce your nicotine addiction and use it correctly.  5. Be  prepared for relapse or difficult situations. Be determined to continue trying to quit, even if you do not succeed at first.  1. GET READY  Set a quit date.   Change your environment.   Get rid of ALL cigarettes, ashtrays, matches, and lighters in your home, car, and place of work.   Do not let people smoke in your home.   Review your past attempts to quit. Think about what worked and what did not.   Once you quit, do not smoke. NOT EVEN A PUFF!  2. GET SUPPORT AND ENCOURAGEMENT Studies have shown that you have a better chance of being successful if you have help. You can get support in many ways.  Tell your family, friends, and coworkers that you are going to quit and need their support. Ask them not to smoke around you.   Talk to your caregivers (doctor, dentist, nurse, pharmacist, psychologist, and/or smoking counselor).   Get individual, group, or telephone counseling and support. The more counseling you have, the better your chances are of quitting. Programs are available at [employer] and health centers. Call your local health department for information about programs in your area.  Spiritual beliefs and practices may help some smokers quit.   Quit meters are Photographer that keep track of quit statistics, such as amount of "quit-time," cigarettes not smoked, and money saved.   Many smokers find one or more of the many self-help books available useful in helping them quit and stay off tobacco.  3. LEARN NEW SKILLS AND BEHAVIORS  Try to distract yourself from urges to smoke. Talk to someone, go for a walk, or occupy your time with a task.   When you first try to quit, change your routine. Take a different route to work. Drink tea instead of coffee. Eat breakfast in a different place.   Do something to reduce your stress. Take a hot bath, exercise, or read a book.   Plan something enjoyable to do every day. Reward yourself for not  smoking.   Explore interactive web-based programs that specialize in helping you quit.  4. GET MEDICINE AND USE IT CORRECTLY Medicines can help you stop smoking and decrease the urge to smoke. Combining medicine with the above behavioral methods and support can quadruple your chances of successfully quitting smoking. The U.S. Food and Drug Administration (FDA) has approved 7 medicines to help you quit smoking. These medicines fall into 3 categories.  Nicotine replacement therapy (delivers nicotine to your body without the negative effects and risks of smoking):   Nicotine gum: Available over-the-counter.   Nicotine lozenges: Available over-the-counter.   Nicotine inhaler: Available by prescription.   Nicotine nasal spray: Available by prescription.   Nicotine skin patches (transdermal): Available by prescription and over-the-counter.   Antidepressant medicine (helps people abstain from smoking, but how this works is unknown):   Bupropion sustained-release (SR) tablets: Available by prescription.   Nicotinic receptor partial agonist (simulates the effect of nicotine in your brain):   Varenicline tartrate tablets: Available by prescription.   Ask your caregiver for advice about which medicines to use and how to use them. Carefully read the information on the package.   Everyone who is trying to quit may benefit from using a medicine. If you are pregnant or trying to become pregnant, nursing an infant, you are under age 82, or you smoke fewer than 10 cigarettes per day, talk to your caregiver before taking any nicotine replacement medicines.   You should stop using a nicotine replacement product and call your caregiver if you experience nausea, dizziness, weakness, vomiting, fast or irregular heartbeat, mouth problems with the lozenge or gum, or redness or swelling of the skin around the patch that does not go away.   Do not use any other product containing nicotine while using a  nicotine replacement product.   Talk to your caregiver before using these products if you have diabetes, heart disease, asthma, stomach ulcers, you had a recent heart attack, you have high blood pressure that is not controlled with medicine, a history of irregular heartbeat, or you have been prescribed medicine to help you quit smoking.  5. BE PREPARED FOR RELAPSE OR DIFFICULT SITUATIONS  Most relapses occur within the first 3 months after quitting. Do not be discouraged if you start smoking again. Remember, most people try several times before they finally quit.   You may have symptoms of withdrawal because your body is used to nicotine. You may crave cigarettes, be irritable, feel very hungry, cough often, get headaches, or have difficulty concentrating.   The withdrawal symptoms are only temporary. They are strongest when you first quit, but they  will go away within 10 to 14 days.  Here are some difficult situations to watch for:  Alcohol. Avoid drinking alcohol. Drinking lowers your chances of successfully quitting.   Caffeine. Try to reduce the amount of caffeine you consume. It also lowers your chances of successfully quitting.   Other smokers. Being around smoking can make you want to smoke. Avoid smokers.   Weight gain. Many smokers will gain weight when they quit, usually less than 10 pounds. Eat a healthy diet and stay active. Do not let weight gain distract you from your main goal, quitting smoking. Some medicines that help you quit smoking may also help delay weight gain. You can always lose the weight gained after you quit.   Bad mood or depression. There are a lot of ways to improve your mood other than smoking.  If you are having problems with any of these situations, talk to your caregiver. SPECIAL SITUATIONS AND CONDITIONS Studies suggest that everyone can quit smoking. Your situation or condition can give you a special reason to quit.  Pregnant women/new mothers: By  quitting, you protect your baby's health and your own.   Hospitalized patients: By quitting, you reduce health problems and help healing.   Heart attack patients: By quitting, you reduce your risk of a second heart attack.   Lung, head, and neck cancer patients: By quitting, you reduce your chance of a second cancer.   Parents of children and adolescents: By quitting, you protect your children from illnesses caused by secondhand smoke.  QUESTIONS TO THINK ABOUT Think about the following questions before you try to stop smoking. You may want to talk about your answers with your caregiver.  Why do you want to quit?   If you tried to quit in the past, what helped and what did not?   What will be the most difficult situations for you after you quit? How will you plan to handle them?   Who can help you through the tough times? Your family? Friends? Caregiver?   What pleasures do you get from smoking? What ways can you still get pleasure if you quit?  Here are some questions to ask your caregiver:  How can you help me to be successful at quitting?   What medicine do you think would be best for me and how should I take it?   What should I do if I need more help?   What is smoking withdrawal like? How can I get information on withdrawal?  Quitting takes hard work and a lot of effort, but you can quit smoking. FOR MORE INFORMATION  Smokefree.gov (http://www.davis-sullivan.com/) provides free, accurate, evidence-based information and professional assistance to help support the immediate and long-term needs of people trying to quit smoking. Document Released: 08/15/2001 Document Revised: 08/10/2011 Document Reviewed: 06/07/2009 [facility] Patient Information 2012 [place], [place].

## 2011-11-24 ENCOUNTER — Ambulatory Visit: Payer: Medicare Other | Admitting: Internal Medicine

## 2011-11-29 ENCOUNTER — Other Ambulatory Visit: Payer: Self-pay | Admitting: Internal Medicine

## 2012-02-01 ENCOUNTER — Other Ambulatory Visit: Payer: Self-pay | Admitting: Internal Medicine

## 2012-05-08 ENCOUNTER — Ambulatory Visit (INDEPENDENT_AMBULATORY_CARE_PROVIDER_SITE_OTHER): Payer: Medicare Other | Admitting: Family Medicine

## 2012-05-08 VITALS — BP 157/71 | HR 72 | Temp 98.7°F | Resp 16 | Ht 64.0 in | Wt 127.0 lb

## 2012-05-08 DIAGNOSIS — R05 Cough: Secondary | ICD-10-CM

## 2012-05-08 DIAGNOSIS — R059 Cough, unspecified: Secondary | ICD-10-CM

## 2012-05-08 DIAGNOSIS — R630 Anorexia: Secondary | ICD-10-CM

## 2012-05-08 LAB — POCT CBC
Hemoglobin: 14 g/dL (ref 12.2–16.2)
Lymph, poc: 2.6 (ref 0.6–3.4)
MCH, POC: 27.8 pg (ref 27–31.2)
MCHC: 31.3 g/dL — AB (ref 31.8–35.4)
MCV: 88.9 fL (ref 80–97)
MID (cbc): 0.5 (ref 0–0.9)
Platelet Count, POC: 224 10*3/uL (ref 142–424)
RBC: 5.04 M/uL (ref 4.04–5.48)
WBC: 5.9 10*3/uL (ref 4.6–10.2)

## 2012-05-08 LAB — COMPREHENSIVE METABOLIC PANEL
BUN: 18 mg/dL (ref 6–23)
CO2: 29 mEq/L (ref 19–32)
Calcium: 10.4 mg/dL (ref 8.4–10.5)
Chloride: 98 mEq/L (ref 96–112)
Creat: 1.09 mg/dL (ref 0.50–1.10)
Total Bilirubin: 0.5 mg/dL (ref 0.3–1.2)

## 2012-05-08 MED ORDER — HYDROCODONE-HOMATROPINE 5-1.5 MG/5ML PO SYRP: 5.0000 mL | ORAL_SOLUTION | ORAL | Status: AC | PRN

## 2012-05-08 MED ORDER — AZITHROMYCIN 250 MG PO TABS: ORAL_TABLET | ORAL | Status: AC

## 2012-05-08 NOTE — Patient Instructions (Signed)
Stop smoking  Take the antibiotics  Drink lots of fluids  Use the cough syrup one teaspoon every 4-6 hours if needed

## 2012-05-08 NOTE — Progress Notes (Signed)
Subjective:  71 year old lady who has had a upper respiratory congestion for the past week. She developed a bad cough. She had fevers. The fevers seem to have subsided but she still coughing up a lot of phlegm. She sneezes a lot. She is blowing a lot out of her nose. She also complains of weight loss. She's gone from 143-127 on her home scales. She says she just does not have an appetite any more for a lot of foods that she used to like. She used to go to Dr. Cato Mulligan, but she wants to switch to someone at [address].  Objective: Elderly lady in no major distress but she is bundled up in a blanket. Her TMs are normal. Throat clear. Neck supple. Chest clear to auscultation. Heart regular without murmurs. Abdomen soft without mass or tenderness. Ankles without edema.  Assessment: Bronchitis Weight loss  Plan: Check a CBC and chemistry panel on her. Results for orders placed in visit on 05/08/12  POCT CBC      Component Value Range   WBC 5.9  4.6 - 10.2 K/uL   Lymph, poc 2.6  0.6 - 3.4   POC LYMPH PERCENT 44.9  10 - 50 %L   MID (cbc) 0.5  0 - 0.9   POC MID % 8.4  0 - 12 %M   POC Granulocyte 2.8  2 - 6.9   Granulocyte percent 46.7  37 - 80 %G   RBC 5.04  4.04 - 5.48 M/uL   Hemoglobin 14.0  12.2 - 16.2 g/dL   HCT, POC 78.2  95.6 - 47.9 %   MCV 88.9  80 - 97 fL   MCH, POC 27.8  27 - 31.2 pg   MCHC 31.3 (*) 31.8 - 35.4 g/dL   RDW, POC 21.3     Platelet Count, POC 224  142 - 424 K/uL   MPV 10.4  0 - 99.8 fL

## 2012-05-08 NOTE — Progress Notes (Deleted)
  Subjective:    Patient ID: [name], female    DOB: October 28, 1940, 71 y.o.   MRN: [id number]  HPI    Review of Systems     Objective:   Physical Exam        Assessment & Plan:

## 2012-05-10 ENCOUNTER — Encounter: Payer: Self-pay | Admitting: Family Medicine

## 2012-05-23 ENCOUNTER — Encounter: Payer: Self-pay | Admitting: Internal Medicine

## 2012-05-23 ENCOUNTER — Other Ambulatory Visit: Payer: Medicare Other

## 2012-05-28 ENCOUNTER — Other Ambulatory Visit: Payer: Self-pay | Admitting: Internal Medicine

## 2012-05-30 ENCOUNTER — Ambulatory Visit: Payer: Medicare Other | Admitting: Internal Medicine

## 2012-05-31 ENCOUNTER — Ambulatory Visit (INDEPENDENT_AMBULATORY_CARE_PROVIDER_SITE_OTHER): Payer: Medicare Other | Admitting: Internal Medicine

## 2012-05-31 ENCOUNTER — Encounter: Payer: Self-pay | Admitting: Internal Medicine

## 2012-05-31 VITALS — BP 144/96 | HR 64 | Temp 98.2°F | Wt 130.0 lb

## 2012-05-31 DIAGNOSIS — E119 Type 2 diabetes mellitus without complications: Secondary | ICD-10-CM

## 2012-05-31 DIAGNOSIS — J449 Chronic obstructive pulmonary disease, unspecified: Secondary | ICD-10-CM

## 2012-05-31 DIAGNOSIS — E039 Hypothyroidism, unspecified: Secondary | ICD-10-CM

## 2012-05-31 DIAGNOSIS — F1010 Alcohol abuse, uncomplicated: Secondary | ICD-10-CM

## 2012-05-31 DIAGNOSIS — F17200 Nicotine dependence, unspecified, uncomplicated: Secondary | ICD-10-CM

## 2012-05-31 LAB — BASIC METABOLIC PANEL
BUN: 17 mg/dL (ref 6–23)
Calcium: 9.4 mg/dL (ref 8.4–10.5)
GFR: 67.52 mL/min (ref >60.00)
Glucose, Bld: 129 mg/dL — ABNORMAL HIGH (ref 70–99)

## 2012-05-31 LAB — HEPATIC FUNCTION PANEL
ALT: 13 U/L (ref 0–35)
AST: 17 U/L (ref 0–37)
Alkaline Phosphatase: 51 U/L (ref 39–117)
Bilirubin, Direct: 0 mg/dL (ref 0.0–0.3)
Total Protein: 6.8 g/dL (ref 6.0–8.3)

## 2012-05-31 LAB — LDL CHOLESTEROL, DIRECT: Direct LDL: 161 mg/dL

## 2012-05-31 MED ORDER — ALBUTEROL SULFATE HFA 108 (90 BASE) MCG/ACT IN AERS: 2.0000 | INHALATION_SPRAY | Freq: Four times a day (QID) | RESPIRATORY_TRACT | Status: DC | PRN

## 2012-05-31 NOTE — Assessment & Plan Note (Signed)
She needs to quit all tobacco products

## 2012-05-31 NOTE — Assessment & Plan Note (Signed)
Her "cold" sxs have probably always been copd exacerbation No need for abx at this time-  Trial albuterol mdi for one week If sxs persist she will need long term medication

## 2012-05-31 NOTE — Progress Notes (Signed)
Patient ID: [name], female   DOB: 1941/01/15, 71 y.o.   MRN: [id number]   patient comes in for followup of multiple medical problems including type 2 diabetes, hyperlipidemia, hypertension. The patient does not check blood sugar or blood pressure at home. The patetient does not follow an exercise or diet program. The patient denies any polyuria, polydipsia.  In the past the patient has gone to diabetic treatment center. The patient is tolerating medications  Without difficulty. The patient does admit to medication compliance.   Past Medical History  Diagnosis Date  . Atrial fibrillation   . COPD (chronic obstructive pulmonary disease)   . Hyperlipidemia   . Hypothyroidism   . Arthritis   . Diabetes mellitus   . History of EMG 11/00    negative    History   Social History  . Marital Status: Married    Spouse Name: N/A    Number of Children: N/A  . Years of Education: N/A   Occupational History  . Not on file.   Social History Main Topics  . Smoking status: Current Every Day Smoker -- 1.0 packs/day    Types: Cigarettes  . Smokeless tobacco: Not on file  . Alcohol Use: 30.0 oz/week    50 Cans of beer per week  . Drug Use:   . Sexually Active:    Other Topics Concern  . Not on file   Social History Narrative  . No narrative on file    Past Surgical History  Procedure Date  . Ankle fracture surgery     left  . Thyroid colloid cyst  12/00  . Coronary angioplasty with stent placement 06/16/05    Family History  Problem Relation Age of Onset  . Cancer Mother     breast    Allergies  Allergen Reactions  . Hydrochlorothiazide W-Triamterene     REACTION: palpitations  . Penicillins     REACTION: urticaria (hives)    Current Outpatient Prescriptions on File Prior to Visit  Medication Sig Dispense Refill  . aspirin 81 MG tablet Take 81 mg by mouth daily.       Marland Kitchen diltiazem (CARDIZEM CD) 300 MG 24 hr capsule Take 300 mg by mouth daily.        Marland Kitchen  levothyroxine (SYNTHROID, LEVOTHROID) 75 MCG tablet TAKE 1 TABLET BY MOUTH EVERY DAY  90 tablet  1  . metFORMIN (GLUCOPHAGE) 500 MG tablet TAKE 1 TABLET BY MOUTH TWICE A DAY  180 tablet  3  . metoprolol (LOPRESSOR) 50 MG tablet Take 1.5 tablets (75 mg total) by mouth daily.  90 tablet  5  . polyethylene glycol (MIRALAX / GLYCOLAX) packet Take 17 g by mouth daily.        . ramipril (ALTACE) 10 MG capsule TAKE ONE CAPSULE BY MOUTH TWICE A DAY  60 capsule  5  . DISCONTD: flecainide (TAMBOCOR) 100 MG tablet Take 100 mg by mouth 2 (two) times daily.        Marland Kitchen DISCONTD: hydrochlorothiazide 25 MG tablet Take 25 mg by mouth daily.           patient denies chest pain, shortness of breath, orthopnea. Denies lower extremity edema, abdominal pain, change in appetite, change in bowel movements. Patient denies rashes, musculoskeletal complaints. No other specific complaints in a complete review of systems.   BP 144/96  Pulse 64  Temp 98.2 F (36.8 C) (Oral)  Wt 130 lb (58.968 kg)  Well-developed well-nourished female in no acute distress.  HEENT exam atraumatic, normocephalic, extraocular muscles are intact. Neck is supple. No jugular venous distention no thyromegaly. Chest clear with bilateral wheezing, without increased work of breathing. Cardiac exam S1 and S2 are regular. Abdominal exam active bowel sounds, soft, nontender. Extremities no edema. Neurologic exam she is alert without any motor sensory deficits. Gait is normal.

## 2012-05-31 NOTE — Assessment & Plan Note (Signed)
Lab Results  Component Value Date   HGBA1C 7.5* 11/17/2011   Check labs today

## 2012-05-31 NOTE — Assessment & Plan Note (Signed)
She is still drinking a few beers daily-  Discussed need for absolute abstinence

## 2012-06-05 NOTE — Progress Notes (Signed)
Try pravastatin 40 mg po qd #90/3 refills

## 2012-06-20 ENCOUNTER — Telehealth: Payer: Self-pay

## 2012-06-20 NOTE — Telephone Encounter (Signed)
[phone]  Labs needed from sept 4 - pt is in office now.  NEVER MIND - THEY HAVE EPIC

## 2012-08-17 ENCOUNTER — Other Ambulatory Visit: Payer: Self-pay | Admitting: Internal Medicine

## 2012-08-26 ENCOUNTER — Other Ambulatory Visit: Payer: Self-pay | Admitting: *Deleted

## 2012-08-26 MED ORDER — METFORMIN HCL 500 MG PO TABS: 500.0000 mg | ORAL_TABLET | Freq: Two times a day (BID) | ORAL | Status: DC

## 2012-09-11 ENCOUNTER — Other Ambulatory Visit: Payer: Self-pay | Admitting: Internal Medicine

## 2012-09-11 NOTE — Telephone Encounter (Signed)
Patient called stating she is completely out of metoprolol.

## 2012-12-21 ENCOUNTER — Other Ambulatory Visit: Payer: Self-pay | Admitting: Internal Medicine

## 2013-01-29 ENCOUNTER — Ambulatory Visit (INDEPENDENT_AMBULATORY_CARE_PROVIDER_SITE_OTHER): Payer: Medicare Other | Admitting: Family Medicine

## 2013-01-29 VITALS — BP 118/80 | HR 72 | Temp 99.1°F | Resp 16 | Ht 65.5 in | Wt 124.0 lb

## 2013-01-29 DIAGNOSIS — E039 Hypothyroidism, unspecified: Secondary | ICD-10-CM

## 2013-01-29 DIAGNOSIS — F4323 Adjustment disorder with mixed anxiety and depressed mood: Secondary | ICD-10-CM

## 2013-01-29 DIAGNOSIS — F17200 Nicotine dependence, unspecified, uncomplicated: Secondary | ICD-10-CM

## 2013-01-29 DIAGNOSIS — E119 Type 2 diabetes mellitus without complications: Secondary | ICD-10-CM

## 2013-01-29 DIAGNOSIS — F1010 Alcohol abuse, uncomplicated: Secondary | ICD-10-CM

## 2013-01-29 DIAGNOSIS — R634 Abnormal weight loss: Secondary | ICD-10-CM

## 2013-01-29 LAB — COMPREHENSIVE METABOLIC PANEL
ALT: 16 U/L (ref 0–35)
AST: 16 U/L (ref 0–37)
Albumin: 4.3 g/dL (ref 3.5–5.2)
Calcium: 10.1 mg/dL (ref 8.4–10.5)
Chloride: 99 mEq/L (ref 96–112)
Potassium: 4.4 mEq/L (ref 3.5–5.3)

## 2013-01-29 LAB — POCT CBC
Granulocyte percent: 52.9 %G (ref 37–80)
MID (cbc): 0.4 (ref 0–0.9)
MPV: 10.3 fL (ref 0–99.8)
POC MID %: 6.6 %M (ref 0–12)
Platelet Count, POC: 196 10*3/uL (ref 142–424)
RBC: 4.91 M/uL (ref 4.04–5.48)

## 2013-01-29 MED ORDER — PAROXETINE HCL 20 MG PO TABS: 10.0000 mg | ORAL_TABLET | ORAL | Status: DC

## 2013-01-29 NOTE — Patient Instructions (Addendum)
Try to push herself to be in more, make sure you eat at each meal time.  Take the generic Paxil 1 daily for anxiety and depression each morning at breakfast.  It is very important you decrease your alcohol intake because if you or harming your liver it will decrease her appetite even more.  Continue to take the appetite boost vitamins that you have purchased.  If you ever get suicidal you should see someone immediately for help.

## 2013-01-29 NOTE — Progress Notes (Signed)
Subjective: 72 year old lady who presents with concerns about her weight loss. She has been under a lot of stress. There've been family issues with their son. She worries a lot about her husband. She has not been happy with her primary care physician, and is interested in finding another one. She continues to smoke. She drinks about 40 ounces of beer. She says this makes her feel a lot better. She is married, lives with her husband. Her diabetes has been under fair control with hemoglobin A1c of about 7.3 last time. She has a long list of medications which takes regularly.  She has fears about SSRIs because of the listed risk of being suicidal. She said she had suicidal concerns when she was young.  Objective: Elderly lady who is quite lean. Neck supple without nodes thyromegaly. Chest is clear. Heart sounded fairly regular but she has a history of atrial fibrillation. Abdomen soft. No masses. Extremities without edema.  Assessment:  Weight loss Anxiety and depression Tobacco abuse Alcohol abuse History of diabetes History hypertension  Plan: Try her on a very low dose of Paxil. See her back in a few weeks at which time I hope we can increase the dose. She is to continue taking a multivitamin entitled Appetite Boost that she has bought [employer]. See discharge instructions.

## 2013-02-12 ENCOUNTER — Telehealth: Payer: Self-pay | Admitting: Neurology

## 2013-02-12 NOTE — Telephone Encounter (Signed)
Pt had sleep studies done 01/2012 and was setup on cpap around 12/2012.  She has been unable to effectively use her cpap machine because of her husband's recent dx of lung cancer which she says they believe has also spread to his colon.  She doesn't think that she will be able to follow up with cpap use because of the constant awakening at night due to her husband's severe shortness of breath.  She states she has lost 10 lbs from the stress.  She is asking for advisement on discontinuing and returning the machine due to current circumstances and based on the sleep study results, just how severe was her sleep apnea to be able to stop using it?

## 2013-02-14 ENCOUNTER — Telehealth: Payer: Self-pay | Admitting: Neurology

## 2013-02-14 NOTE — Telephone Encounter (Signed)
I was able to leave only a couple of seconds of a message on this patient's otherwise full voice mailbox. Please make sure that her sleep study is scanned into the Epic system.

## 2013-02-17 ENCOUNTER — Telehealth: Payer: Self-pay | Admitting: Neurology

## 2013-02-21 ENCOUNTER — Telehealth: Payer: Self-pay

## 2013-02-21 NOTE — Telephone Encounter (Signed)
Pt would like to know what her cholesterol and blood sugar levels from when she was seen in May. Best# [phone]

## 2013-02-21 NOTE — Telephone Encounter (Signed)
Pts questions answered.  

## 2013-02-22 ENCOUNTER — Telehealth: Payer: Self-pay | Admitting: Neurology

## 2013-02-22 NOTE — Telephone Encounter (Signed)
Her husband needs now oxygen and she stopped cpap because it makes her sleeping too sound.... And he is very sick.  1 month of non compliance.. I encouraged her to restart - and that if she still has the machine , she needs to use it as many hours as she can.  Note to Christeen Douglas at [facility] to see if this patient is still active.

## 2013-02-25 ENCOUNTER — Encounter: Payer: Self-pay | Admitting: Neurology

## 2013-02-27 ENCOUNTER — Encounter: Payer: Self-pay | Admitting: Neurology

## 2013-03-25 ENCOUNTER — Other Ambulatory Visit: Payer: Self-pay | Admitting: Internal Medicine

## 2013-04-09 ENCOUNTER — Ambulatory Visit (INDEPENDENT_AMBULATORY_CARE_PROVIDER_SITE_OTHER): Payer: Medicare Other

## 2013-04-19 ENCOUNTER — Ambulatory Visit (INDEPENDENT_AMBULATORY_CARE_PROVIDER_SITE_OTHER): Payer: Medicare Other | Admitting: Family Medicine

## 2013-04-19 ENCOUNTER — Ambulatory Visit: Payer: Medicare Other

## 2013-04-19 ENCOUNTER — Encounter: Payer: Self-pay | Admitting: Family Medicine

## 2013-04-19 VITALS — BP 122/84 | HR 60 | Temp 98.6°F | Resp 18 | Ht 63.5 in | Wt 122.0 lb

## 2013-04-19 DIAGNOSIS — Z8639 Personal history of other endocrine, nutritional and metabolic disease: Secondary | ICD-10-CM

## 2013-04-19 DIAGNOSIS — R05 Cough: Secondary | ICD-10-CM

## 2013-04-19 DIAGNOSIS — R059 Cough, unspecified: Secondary | ICD-10-CM

## 2013-04-19 DIAGNOSIS — E119 Type 2 diabetes mellitus without complications: Secondary | ICD-10-CM

## 2013-04-19 DIAGNOSIS — Z862 Personal history of diseases of the blood and blood-forming organs and certain disorders involving the immune mechanism: Secondary | ICD-10-CM

## 2013-04-19 LAB — POCT GLYCOSYLATED HEMOGLOBIN (HGB A1C): Hemoglobin A1C: 5.9

## 2013-04-19 LAB — GLUCOSE, POCT (MANUAL RESULT ENTRY): POC Glucose: 85 mg/dl (ref 70–99)

## 2013-04-19 NOTE — Progress Notes (Addendum)
72 yo woman who is concerned with weight loss.  She has a nocturnal cough as of last month.  She was started on Paxil last May and takes 1/2 tab qhs.  Sleeping well.   Continues to smoke, feel stress with husband's abnormal chest films and end stage lung disease, and drink 40 oz beer daily She uses a CPAP machine some nights. 7 lb weight  Loss over last 11 months.  Objective:  NAD HEENT: unremarkable Neck:  Supple no thyromegaly or adenopathy Chest: clear Heart:  Reg, no murmur Abdomen:  Soft, nontender, no HSM or mass Skin: doughy, no jaundice or rash. [facility] reading (PRIMARY) by  Dr. Milus Glazier CXR negative Results for orders placed in visit on 01/29/13  COMPREHENSIVE METABOLIC PANEL      Result Value Range   Sodium 135  135 - 145 mEq/L   Potassium 4.4  3.5 - 5.3 mEq/L   Chloride 99  96 - 112 mEq/L   CO2 25  19 - 32 mEq/L   Glucose, Bld 69 (*) 70 - 99 mg/dL   BUN 14  6 - 23 mg/dL   Creat 1.61  0.96 - 0.45 mg/dL   Total Bilirubin 0.5  0.3 - 1.2 mg/dL   Alkaline Phosphatase 65  39 - 117 U/L   AST 16  0 - 37 U/L   ALT 16  0 - 35 U/L   Total Protein 7.2  6.0 - 8.3 g/dL   Albumin 4.3  3.5 - 5.2 g/dL   Calcium 40.9  8.4 - 81.1 mg/dL  TSH      Result Value Range   TSH 0.637  0.350 - 4.500 uIU/mL  POCT CBC      Result Value Range   WBC 5.4  4.6 - 10.2 K/uL   Lymph, poc 2.2  0.6 - 3.4   POC LYMPH PERCENT 40.5  10 - 50 %L   MID (cbc) 0.4  0 - 0.9   POC MID % 6.6  0 - 12 %M   POC Granulocyte 2.9  2 - 6.9   Granulocyte percent 52.9  37 - 80 %G   RBC 4.91  4.04 - 5.48 M/uL   Hemoglobin 14.1  12.2 - 16.2 g/dL   HCT, POC 91.4  78.2 - 47.9 %   MCV 90.7  80 - 97 fL   MCH, POC 28.7  27 - 31.2 pg   MCHC 31.7 (*) 31.8 - 35.4 g/dL   RDW, POC 95.6     Platelet Count, POC 196  142 - 424 K/uL   MPV 10.3  0 - 99.8 fL   .  Results for orders placed in visit on 04/19/13  GLUCOSE, POCT (MANUAL RESULT ENTRY)      Result Value Range   POC Glucose 85  70 - 99 mg/dl  POCT GLYCOSYLATED  HEMOGLOBIN (HGB A1C)      Result Value Range   Hemoglobin A1C 5.9     Assessment:  Mild weight loss  Plan:  Stop alcohol! Stop metformin Paxil 20 qhs Follow up 2 weeks  Signed, Elvina Sidle, MD

## 2013-04-20 ENCOUNTER — Ambulatory Visit (INDEPENDENT_AMBULATORY_CARE_PROVIDER_SITE_OTHER): Payer: Medicare Other | Admitting: Family Medicine

## 2013-04-20 VITALS — BP 128/82 | HR 50 | Temp 98.0°F | Resp 17 | Ht 65.5 in | Wt 120.0 lb

## 2013-04-20 DIAGNOSIS — F4323 Adjustment disorder with mixed anxiety and depressed mood: Secondary | ICD-10-CM

## 2013-04-20 DIAGNOSIS — R05 Cough: Secondary | ICD-10-CM

## 2013-04-20 DIAGNOSIS — R059 Cough, unspecified: Secondary | ICD-10-CM

## 2013-04-20 MED ORDER — HYDROCODONE-HOMATROPINE 5-1.5 MG/5ML PO SYRP: 5.0000 mL | ORAL_SOLUTION | Freq: Three times a day (TID) | ORAL | Status: DC | PRN

## 2013-04-20 MED ORDER — PAROXETINE HCL 20 MG PO TABS: 20.0000 mg | ORAL_TABLET | Freq: Every day | ORAL | Status: DC

## 2013-04-20 NOTE — Progress Notes (Signed)
72 yo woman with persistent weight loss.  She was seen yesterday and the metformin was discontinued, but she still lost weight from yesterday. Blood sugar after coffee and piece of chicken, BS was 144.  She continues to cough.  Continues to smoke, drank 1/2 glass of beer last night.  Didn't take the Paxil last night.  Objective:  NAD HEENT unremarkable Chest: few basilar rales which mostly clear with congested cough Heart:  Reg, no murmur Abdomen:  Soft, nontender with no HSM or mass Results for orders placed in visit on 04/19/13  GLUCOSE, POCT (MANUAL RESULT ENTRY)      Result Value Range   POC Glucose 85  70 - 99 mg/dl  POCT GLYCOSYLATED HEMOGLOBIN (HGB A1C)      Result Value Range   Hemoglobin A1C 5.9     Assessment:  Weight loss is a problem and I am hoping the increased dose of Paxil will help.  Also, her decreasing the beer should make a difference.  Plan: recheck 2 weeks  Signed, Elvina Sidle, MD

## 2013-04-20 NOTE — Patient Instructions (Addendum)
Return in about 2 weeks

## 2013-04-30 ENCOUNTER — Other Ambulatory Visit: Payer: Self-pay | Admitting: Internal Medicine

## 2013-05-13 ENCOUNTER — Ambulatory Visit (INDEPENDENT_AMBULATORY_CARE_PROVIDER_SITE_OTHER): Payer: Medicare Other | Admitting: Internal Medicine

## 2013-05-13 VITALS — BP 154/90 | HR 57 | Temp 98.5°F | Resp 16 | Ht 64.0 in | Wt 120.0 lb

## 2013-05-13 DIAGNOSIS — F4323 Adjustment disorder with mixed anxiety and depressed mood: Secondary | ICD-10-CM

## 2013-05-13 DIAGNOSIS — R634 Abnormal weight loss: Secondary | ICD-10-CM

## 2013-05-13 MED ORDER — PAROXETINE HCL 30 MG PO TABS: 30.0000 mg | ORAL_TABLET | Freq: Every day | ORAL | Status: DC

## 2013-05-13 NOTE — Progress Notes (Signed)
  Subjective:    Patient ID: [name], female    DOB: 08-22-1941, 72 y.o.   MRN: [id number]  HPIcontinues to worry about weight loss, less appetite and loss of skin turgor making her less attractive She thinks stress started this and is better at paxil 20--food more appealing Husband w/ lung nodule bx tomorrow Recently able to decrease metf to one a day Thyr studies were wnl last month  Review of Systems No fever chills sweats No cough No edema Sleep better Continues w/ etoh    Objective:   Physical Exam BP 154/90  Pulse 57  Temp(Src) 98.5 F (36.9 C) (Oral)  Resp 16  Ht 5\' 4"  (1.626 m)  Wt 120 lb (54.432 kg)  BMI 20.59 kg/m2  SpO2 98% Recent visit BPs all ok HEENT clear Ht reg extr no edema Skin with lessening fat deposition-?age vs wt loss Mood stable     Assessment & Plan:  Wt loss--reassured has stablilized Anxiety--iincrease paxil to 30mg   F/u Dr L or Dr Darnelle Catalan. As planned  Next A1C and TSH 2 months

## 2013-05-17 ENCOUNTER — Telehealth: Payer: Self-pay

## 2013-05-17 NOTE — Telephone Encounter (Signed)
Please advise 

## 2013-05-17 NOTE — Telephone Encounter (Signed)
Pt says that her Paroxetine dosage was increased.  Says that she has became very moody and snappy, especially toward her husband.  She called her pharmacy to see if this was a side effect and they told her to call us to see about lowering the dosage.  Please call [phone]

## 2013-05-19 NOTE — Telephone Encounter (Signed)
Ok to go back to 20mg  which she was on before Does she have enough

## 2013-05-20 MED ORDER — PAROXETINE HCL 20 MG PO TABS: 20.0000 mg | ORAL_TABLET | ORAL | Status: DC

## 2013-05-20 NOTE — Telephone Encounter (Signed)
Patient advised. New Rx sent in at new dose.

## 2013-05-30 ENCOUNTER — Ambulatory Visit (INDEPENDENT_AMBULATORY_CARE_PROVIDER_SITE_OTHER): Payer: Medicare Other | Admitting: Internal Medicine

## 2013-05-30 VITALS — BP 180/100 | HR 48 | Temp 98.6°F | Resp 20 | Ht 65.0 in | Wt 120.0 lb

## 2013-05-30 DIAGNOSIS — R634 Abnormal weight loss: Secondary | ICD-10-CM

## 2013-05-30 DIAGNOSIS — F3289 Other specified depressive episodes: Secondary | ICD-10-CM

## 2013-05-30 DIAGNOSIS — Z23 Encounter for immunization: Secondary | ICD-10-CM

## 2013-05-30 DIAGNOSIS — E119 Type 2 diabetes mellitus without complications: Secondary | ICD-10-CM

## 2013-05-30 DIAGNOSIS — F17200 Nicotine dependence, unspecified, uncomplicated: Secondary | ICD-10-CM

## 2013-05-30 DIAGNOSIS — E039 Hypothyroidism, unspecified: Secondary | ICD-10-CM

## 2013-05-30 DIAGNOSIS — F1010 Alcohol abuse, uncomplicated: Secondary | ICD-10-CM

## 2013-05-30 DIAGNOSIS — F329 Major depressive disorder, single episode, unspecified: Secondary | ICD-10-CM

## 2013-05-30 LAB — COMPREHENSIVE METABOLIC PANEL
Albumin: 4.1 g/dL (ref 3.5–5.2)
Alkaline Phosphatase: 64 U/L (ref 39–117)
BUN: 12 mg/dL (ref 6–23)
CO2: 27 mEq/L (ref 19–32)
Calcium: 10.1 mg/dL (ref 8.4–10.5)
Chloride: 98 mEq/L (ref 96–112)
Creat: 0.89 mg/dL (ref 0.50–1.10)
Glucose, Bld: 167 mg/dL — ABNORMAL HIGH (ref 70–99)
Potassium: 4.6 mEq/L (ref 3.5–5.3)

## 2013-05-30 LAB — POCT CBC
HCT, POC: 43.7 % (ref 37.7–47.9)
Lymph, poc: 2.6 (ref 0.6–3.4)
MCHC: 31.6 g/dL — AB (ref 31.8–35.4)
MCV: 92.1 fL (ref 80–97)
MID (cbc): 0.5 (ref 0–0.9)
POC LYMPH PERCENT: 40.7 %L (ref 10–50)
Platelet Count, POC: 234 10*3/uL (ref 142–424)
RDW, POC: 15.3 %

## 2013-05-30 NOTE — Progress Notes (Signed)
Subjective:    Patient ID: [name], female    DOB: Feb 26, 1941, 72 y.o.   MRN: [id number]  HPI complaining of continued weight loss/note all recent visits 15-17 lbs loss 1 year Smoker-has not been able to quit//over use alcohol occas cough  Patient Active Problem List   Diagnosis Date Noted  . ATRIAL FIBRILLATION, PAROXYSMAL 04/02/2009  . ABUSE, ALCOHOL, CONTINUOUS-- is a variable history  06/21/2007  . TOBACCO USE----unable to quit  04/26/2007  . DIABETES-TYPE 2 01/09/2007  . DEPRESSION--- recently started on Paxil and advanced dose does not affect symptoms  01/09/2007  . HYPERTENSION, ESSENTIAL NOS---Dr Briant Cedar 01/09/2007  . HYPOTHYROIDISM----out of meds---? Who--[facility] 11/27/2006  . HYPERLIPIDEMIA------ 11/27/2006  . COPD 11/27/2006  . OSTEOARTHRITIS 11/27/2006  Current outpatient prescriptions:aspirin 81 MG tablet, Take 81 mg by mouth daily. , Disp: , Rfl: ;  diltiazem (CARDIZEM CD) 300 MG 24 hr capsule, Take 300 mg by mouth daily.  , Disp: , Rfl: ;  HYDROcodone-homatropine (HYCODAN) 5-1.5 MG/5ML syrup, Take 5 mL by mouth every 8 (eight) hours as needed for cough., Disp: 120 mL, Rfl: 0;  levothyroxine (SYNTHROID, LEVOTHROID) 75 MCG tablet, TAKE 1 TABLET BY MOUTH EVERY DAY, Disp: 90 tablet, Rfl: 0 metFORMIN (GLUCOPHAGE) 500 MG tablet, Take 500 mg by mouth 2 (two) times daily with a meal., Disp: , Rfl: ;  metoprolol (LOPRESSOR) 50 MG tablet, TAKE 1 & 1/2 TABLETS DAILY, Disp: 90 tablet, Rfl: 5;  PARoxetine (PAXIL) 20 MG tablet, Take 1 tablet (20 mg total) by mouth every morning., Disp: 30 tablet, Rfl: 5;  polyethylene glycol (MIRALAX / GLYCOLAX) packet, Take 17 g by mouth daily.  , Disp: , Rfl:  ramipril (ALTACE) 10 MG capsule, TAKE 1 CAPSULE BY MOUTH TWICE A DAY, Disp: 180 capsule, Rfl: 1;  atorvastatin (LIPITOR) 20 MG tablet, Take 20 mg by mouth daily., Disp: , Rfl: ;  [DISCONTINUED] flecainide (TAMBOCOR) 100 MG tablet, Take 100 mg by mouth 2 (two) times daily.  , Disp: , Rfl:  ;  [DISCONTINUED] hydrochlorothiazide 25 MG tablet, Take 25 mg by mouth daily.  , Disp: , Rfl:   Colonoscopy 2009 found polyp  Review of Systems No fever chills or night sweats Appetite variable No chest pain or palpitations No recent increase in shortness of breath No abdominal pain or change in stools No hematochezia or melena No joint problems No GYN problems No urinary symptoms Marked increase in anxiety with worry about her weight loss    Objective:   Physical Exam BP 180/100  Pulse 48  Temp(Src) 98.6 F (37 C) (Oral)  Resp 20  Ht 5\' 5"  (1.651 m)  Wt 120 lb (54.432 kg)  BMI 19.97 kg/m2  SpO2 100% No acute distress Pupils equal round react to light and accommodation/EOMs conjugate No thyromegaly or lymphadenopathy Rest of ENT clear Heart regular with no murmur No carotid bruits Lungs clear Abdomen supple without organomegaly Extremities good pulses and no edema CN 2-12 intact       Results for orders placed in visit on 05/30/13  POCT CBC      Result Value Range   WBC 6.5  4.6 - 10.2 K/uL   Lymph, poc 2.6  0.6 - 3.4   POC LYMPH PERCENT 40.7  10 - 50 %L   MID (cbc) 0.5  0 - 0.9   POC MID % 7.2  0 - 12 %M   POC Granulocyte 3.4  2 - 6.9   Granulocyte percent 52.1  37 - 80 %  G   RBC 4.75  4.04 - 5.48 M/uL   Hemoglobin 13.8  12.2 - 16.2 g/dL   HCT, POC 62.1  30.8 - 47.9 %   MCV 92.1  80 - 97 fL   MCH, POC 29.1  27 - 31.2 pg   MCHC 31.6 (*) 31.8 - 35.4 g/dL   RDW, POC 65.7     Platelet Count, POC 234  142 - 424 K/uL   MPV 10.7  0 - 99.8 fL  POCT GLYCOSYLATED HEMOGLOBIN (HGB A1C)      Result Value Range   Hemoglobin A1C 6.1     Recent chest x-ray within normal limits Assessment & Plan:  Problem #1 weight loss Problem #2 generalized anxiety/depression Problem #3 diabetes Problem #4 chronic smoker Problem #5 history of alcohol abuse Problem #6 hypertension uncontrolled Problem #7 hypothyroidism--- check lab  Unclear etiology/there seems to be a lot  of concern about an underlying cancer with lots of anxiety which is affecting her appetite and well being Recommended discontinuing cigarettes and alcohol although this is unlikely Needs to evaluate home blood pressures consistently to consider increasing medication Should reevaluate colonoscopy since polyps found in 2009 Flu shot given

## 2013-06-04 ENCOUNTER — Encounter: Payer: Self-pay | Admitting: Internal Medicine

## 2013-06-20 ENCOUNTER — Other Ambulatory Visit: Payer: Self-pay | Admitting: Internal Medicine

## 2013-07-04 ENCOUNTER — Telehealth: Payer: Self-pay

## 2013-07-04 NOTE — Telephone Encounter (Signed)
07/04/2013 - PATIENT STATES SHE WAS IN TO SEE DR. DOOLITTLE ABOUT 1 MONTH AGO FOR WEIGHT LOSS. SHE HAS NEVER HEARD BACK FROM [place] REGARDING HER LAB RESULTS. SHE WOULD LIKE TO GET A CALL BACK. BEST PHONE [phone] (HOME)  (AFTER 4:00-PLEASE CALL HER CELL AT [phone].     PHARMACY CHOICE IS [facility] ON [address].   MBC

## 2013-07-04 NOTE — Telephone Encounter (Signed)
Called her to advise. She did not get the letter.

## 2013-08-18 ENCOUNTER — Other Ambulatory Visit: Payer: Self-pay | Admitting: Internal Medicine

## 2013-09-04 LAB — HM DIABETES EYE EXAM

## 2013-09-19 ENCOUNTER — Other Ambulatory Visit: Payer: Self-pay | Admitting: Family Medicine

## 2013-09-19 ENCOUNTER — Other Ambulatory Visit: Payer: Self-pay | Admitting: Internal Medicine

## 2013-10-09 ENCOUNTER — Other Ambulatory Visit: Payer: Self-pay | Admitting: Internal Medicine

## 2013-11-04 ENCOUNTER — Ambulatory Visit (INDEPENDENT_AMBULATORY_CARE_PROVIDER_SITE_OTHER): Payer: Medicare Other | Admitting: Internal Medicine

## 2013-11-04 ENCOUNTER — Encounter: Payer: Self-pay | Admitting: Internal Medicine

## 2013-11-04 ENCOUNTER — Telehealth: Payer: Self-pay | Admitting: Internal Medicine

## 2013-11-04 VITALS — BP 182/102 | HR 76 | Temp 98.3°F | Ht 65.0 in | Wt 119.0 lb

## 2013-11-04 DIAGNOSIS — F1010 Alcohol abuse, uncomplicated: Secondary | ICD-10-CM

## 2013-11-04 DIAGNOSIS — R634 Abnormal weight loss: Secondary | ICD-10-CM

## 2013-11-04 DIAGNOSIS — E119 Type 2 diabetes mellitus without complications: Secondary | ICD-10-CM

## 2013-11-04 DIAGNOSIS — I4891 Unspecified atrial fibrillation: Secondary | ICD-10-CM

## 2013-11-04 DIAGNOSIS — E785 Hyperlipidemia, unspecified: Secondary | ICD-10-CM

## 2013-11-04 DIAGNOSIS — E039 Hypothyroidism, unspecified: Secondary | ICD-10-CM

## 2013-11-04 DIAGNOSIS — E1159 Type 2 diabetes mellitus with other circulatory complications: Secondary | ICD-10-CM

## 2013-11-04 LAB — MICROALBUMIN / CREATININE URINE RATIO
Creatinine,U: 150.2 mg/dL
Microalb Creat Ratio: 7.7 mg/g (ref 0.0–30.0)
Microalb, Ur: 11.5 mg/dL — ABNORMAL HIGH (ref 0.0–1.9)

## 2013-11-04 LAB — LIPID PANEL
CHOL/HDL RATIO: 3
Cholesterol: 235 mg/dL — ABNORMAL HIGH (ref 0–200)
HDL: 82.9 mg/dL (ref >39.00)
LDL CALC: 133 mg/dL — AB (ref 0–99)
Triglycerides: 96 mg/dL (ref 0.0–149.0)
VLDL: 19.2 mg/dL (ref 0.0–40.0)

## 2013-11-04 LAB — BASIC METABOLIC PANEL
BUN: 14 mg/dL (ref 6–23)
CO2: 29 meq/L (ref 19–32)
Calcium: 9.6 mg/dL (ref 8.4–10.5)
Chloride: 99 mEq/L (ref 96–112)
Creatinine, Ser: 0.9 mg/dL (ref 0.4–1.2)
GFR: 81.72 mL/min (ref >60.00)
GLUCOSE: 112 mg/dL — AB (ref 70–99)
POTASSIUM: 3.6 meq/L (ref 3.5–5.1)
SODIUM: 135 meq/L (ref 135–145)

## 2013-11-04 LAB — HM DIABETES FOOT EXAM

## 2013-11-04 LAB — HEPATIC FUNCTION PANEL
ALBUMIN: 3.8 g/dL (ref 3.5–5.2)
ALT: 15 U/L (ref 0–35)
AST: 20 U/L (ref 0–37)
Alkaline Phosphatase: 55 U/L (ref 39–117)
Bilirubin, Direct: 0 mg/dL (ref 0.0–0.3)
TOTAL PROTEIN: 6.6 g/dL (ref 6.0–8.3)
Total Bilirubin: 0.9 mg/dL (ref 0.3–1.2)

## 2013-11-04 LAB — HEMOGLOBIN A1C: Hgb A1c MFr Bld: 6.6 % — ABNORMAL HIGH (ref 4.6–6.5)

## 2013-11-04 LAB — TSH: TSH: 0.61 u[IU]/mL (ref 0.35–5.50)

## 2013-11-04 NOTE — Progress Notes (Signed)
Dm- needs f/u  Weight loss over the past several years  afib-paroxysmal. No symptomatic recurrence. Reviewed dr. Jenene Slicker note from12/18/2007.   Smoker- refuses to quit- she admits to smoking more (more than 1ppd)  Alcohol use - drinking 9 beers per day at least.   cxr 2014 without active disease  Lipids- has not been compliant with meds (atorvastatin)  Past Medical History  Diagnosis Date  . Atrial fibrillation   . COPD (chronic obstructive pulmonary disease)   . Hyperlipidemia   . Hypothyroidism   . Arthritis   . Diabetes mellitus   . History of EMG 11/00    negative    History   Social History  . Marital Status: Married    Spouse Name: N/A    Number of Children: N/A  . Years of Education: N/A   Occupational History  . Not on file.   Social History Main Topics  . Smoking status: Current Every Day Smoker -- 1.00 packs/day    Types: Cigarettes  . Smokeless tobacco: Not on file  . Alcohol Use: 30.0 oz/week    50 Cans of beer per week     Comment: (1) 40 oz, (2) cans beer daily   . Drug Use: No  . Sexual Activity: No   Other Topics Concern  . Not on file   Social History Narrative  . No narrative on file    Past Surgical History  Procedure Laterality Date  . Ankle fracture surgery      left  . Thyroid colloid cyst   12/00  . Coronary angioplasty with stent placement  06/16/05    Family History  Problem Relation Age of Onset  . Cancer Mother     breast    Allergies  Allergen Reactions  . Atorvastatin Other (See Comments)    Muscle aches  . Hydrochlorothiazide W-Triamterene     REACTION: palpitations  . Penicillins     REACTION: urticaria (hives)    Current Outpatient Prescriptions on File Prior to Visit  Medication Sig Dispense Refill  . aspirin 81 MG tablet Take 81 mg by mouth daily.       Marland Kitchen diltiazem (CARDIZEM CD) 300 MG 24 hr capsule Take 300 mg by mouth daily.        Marland Kitchen levothyroxine (SYNTHROID, LEVOTHROID) 75 MCG tablet TAKE 1  TABLET BY MOUTH EVERY DAY  90 tablet  0  . metFORMIN (GLUCOPHAGE) 500 MG tablet Take 500 mg by mouth 2 (two) times daily with a meal.      . metoprolol (LOPRESSOR) 50 MG tablet TAKE 1.5 TABLETS BY MOUTH EVERY DAY  90 tablet  0  . PARoxetine (PAXIL) 20 MG tablet Take 1 tablet (20 mg total) by mouth every morning.  30 tablet  5  . polyethylene glycol (MIRALAX / GLYCOLAX) packet Take 17 g by mouth daily.        . ramipril (ALTACE) 10 MG capsule TAKE 1 CAPSULE BY MOUTH TWICE A DAY  180 capsule  1  . [DISCONTINUED] flecainide (TAMBOCOR) 100 MG tablet Take 100 mg by mouth 2 (two) times daily.        . [DISCONTINUED] hydrochlorothiazide 25 MG tablet Take 25 mg by mouth daily.         No current facility-administered medications on file prior to visit.     patient denies chest pain, shortness of breath, orthopnea. Denies lower extremity edema, abdominal pain, change in appetite, change in bowel movements. Patient denies rashes, musculoskeletal complaints. No other specific  complaints in a complete review of systems.   BP 182/102  Pulse 76  Temp(Src) 98.3 F (36.8 C) (Oral)  Ht 5\' 5"  (1.651 m)  Wt 119 lb (53.978 kg)  BMI 19.80 kg/m2  Well-developed well-nourished female in no acute distress. HEENT exam atraumatic, normocephalic, extraocular muscles are intact. Neck is supple. No jugular venous distention no thyromegaly. Chest clear to auscultation without increased work of breathing. Cardiac exam S1 and S2 are regular. Abdominal exam active bowel sounds, soft, nontender. Extremities no edema. Neurologic exam she is alert without any motor sensory deficits. Gait is normal.   DIABETES-TYPE 2 She understands need to quit-- refuses to do so  HYPOTHYROIDISM Lab Results  Component Value Date   TSH 0.848 05/30/2013   Controlled- will f/u within the next 6-12 months  ABUSE, ALCOHOL, CONTINUOUS She understands the need for complete cessation  HYPERLIPIDEMIA She has been noncompliant with  medications. Discussed the need for strict lipid control on meds. Will check labs today  ATRIAL FIBRILLATION, PAROXYSMAL No recurrence   Mood- she has been struggling with mood due to husband's illness (has required radiation therapy for cancer)

## 2013-11-04 NOTE — Assessment & Plan Note (Signed)
No recurrence. 

## 2013-11-04 NOTE — Assessment & Plan Note (Signed)
She understands the need for complete cessation

## 2013-11-04 NOTE — Assessment & Plan Note (Signed)
She understands need to quit-- refuses to do so

## 2013-11-04 NOTE — Progress Notes (Signed)
Pre visit review using our clinic review tool, if applicable. No additional management support is needed unless otherwise documented below in the visit note. 

## 2013-11-04 NOTE — Telephone Encounter (Signed)
Relevant patient education mailed to patient.  

## 2013-11-04 NOTE — Assessment & Plan Note (Signed)
She has been noncompliant with medications. Discussed the need for strict lipid control on meds. Will check labs today

## 2013-11-04 NOTE — Assessment & Plan Note (Signed)
Lab Results  Component Value Date   TSH 0.848 05/30/2013   Controlled- will f/u within the next 6-12 months

## 2013-11-11 ENCOUNTER — Other Ambulatory Visit: Payer: Self-pay | Admitting: *Deleted

## 2013-11-18 ENCOUNTER — Other Ambulatory Visit: Payer: Self-pay | Admitting: *Deleted

## 2013-11-18 ENCOUNTER — Ambulatory Visit (INDEPENDENT_AMBULATORY_CARE_PROVIDER_SITE_OTHER): Payer: Medicare Other | Admitting: Emergency Medicine

## 2013-11-18 VITALS — BP 122/68 | HR 50 | Temp 98.3°F | Resp 17 | Ht 65.5 in | Wt 122.0 lb

## 2013-11-18 DIAGNOSIS — M79609 Pain in unspecified limb: Secondary | ICD-10-CM

## 2013-11-18 DIAGNOSIS — S61459A Open bite of unspecified hand, initial encounter: Secondary | ICD-10-CM

## 2013-11-18 DIAGNOSIS — W540XXA Bitten by dog, initial encounter: Principal | ICD-10-CM

## 2013-11-18 DIAGNOSIS — M79643 Pain in unspecified hand: Secondary | ICD-10-CM

## 2013-11-18 DIAGNOSIS — S61409A Unspecified open wound of unspecified hand, initial encounter: Secondary | ICD-10-CM

## 2013-11-18 DIAGNOSIS — Z23 Encounter for immunization: Secondary | ICD-10-CM

## 2013-11-18 MED ORDER — DOXYCYCLINE HYCLATE 50 MG PO CAPS: 100.0000 mg | ORAL_CAPSULE | Freq: Two times a day (BID) | ORAL | Status: AC

## 2013-11-18 MED ORDER — METRONIDAZOLE 500 MG PO TABS: 500.0000 mg | ORAL_TABLET | Freq: Two times a day (BID) | ORAL | Status: DC

## 2013-11-18 NOTE — Progress Notes (Signed)
   Subjective:    Patient ID: [name], female    DOB: 1940-11-06, 73 y.o.   MRN: [id number]  HPI 73 yo female with complaint of dog biting her right hand today, just prior to arrival.  Occurred when she tried to take plastic bottle back from dog.  Dogs immunizations are lagging, but has had several doses throughout life.  Not acting abnormal otherwise.  It is her dog and can be observed at home.  PPMH:  Atrial fibrillation, Diabetes.  SH:  Lives with husband, current smoker, positive alcohol   Review of Systems  Constitutional: Negative for fever and chills.  Respiratory: Negative for cough, chest tightness and shortness of breath.   Cardiovascular: Positive for chest pain.  Musculoskeletal: Negative for arthralgias and joint swelling.  Skin: Positive for wound.  Allergic/Immunologic: Positive for immunocompromised state.  Neurological: Negative for weakness and numbness.       Objective:   Physical Exam Blood pressure 122/68, pulse 50, temperature 98.3 F (36.8 C), temperature source Oral, resp. rate 17, height 5' 5.5" (1.664 m), weight 122 lb (55.339 kg), SpO2 96.00%. Body mass index is 19.99 kg/(m^2). Well-developed, well nourished female who is awake, alert and oriented, in NAD. HEENT: [place]/AT, PERRL, EOMI.  Sclera and conjunctiva are clear.   Neck: supple, non-tender Heart: RRR, no murmur Lungs: normal effort, CTA Abdomen: normo-active bowel sounds, supple, non-tender, no mass or organomegaly. Extremities: 3 puncture wounds to right hand, one on 5th digit, one on 2nd mcp joint and one on dorsum of hand. Skin: wounds as above. Psychologic: good mood and appropriate affect, normal speech and behavior.   Patient washed wounds extensively in urgent care.     Assessment & Plan:  Dog Bite to right hand.  Plan:  Patient is diabetic, so will treat with prophylactic antibiotics given location of higher risk of infection.  She has allegry to PCN, so will treat with Doxy  and Flagyl.  Advised patient to avoid alcohol for one week while taking medications.  Tetanus shot given today.

## 2013-11-18 NOTE — Patient Instructions (Signed)

## 2013-12-06 ENCOUNTER — Other Ambulatory Visit: Payer: Self-pay | Admitting: Internal Medicine

## 2013-12-19 ENCOUNTER — Other Ambulatory Visit: Payer: Self-pay | Admitting: Internal Medicine

## 2013-12-24 ENCOUNTER — Other Ambulatory Visit: Payer: Self-pay | Admitting: Internal Medicine

## 2013-12-24 DIAGNOSIS — Z1231 Encounter for screening mammogram for malignant neoplasm of breast: Secondary | ICD-10-CM

## 2013-12-25 ENCOUNTER — Other Ambulatory Visit: Payer: Medicare Other

## 2013-12-26 ENCOUNTER — Other Ambulatory Visit (INDEPENDENT_AMBULATORY_CARE_PROVIDER_SITE_OTHER): Payer: Medicare Other

## 2013-12-26 DIAGNOSIS — E785 Hyperlipidemia, unspecified: Secondary | ICD-10-CM

## 2013-12-26 LAB — HEPATIC FUNCTION PANEL
ALT: 18 U/L (ref 0–35)
AST: 17 U/L (ref 0–37)
Albumin: 4 g/dL (ref 3.5–5.2)
Alkaline Phosphatase: 62 U/L (ref 39–117)
BILIRUBIN DIRECT: 0.1 mg/dL (ref 0.0–0.3)
Total Bilirubin: 1.1 mg/dL (ref 0.3–1.2)
Total Protein: 6.9 g/dL (ref 6.0–8.3)

## 2013-12-26 LAB — LIPID PANEL
CHOLESTEROL: 247 mg/dL — AB (ref 0–200)
HDL: 74.1 mg/dL (ref >39.00)
LDL CALC: 153 mg/dL — AB (ref 0–99)
Total CHOL/HDL Ratio: 3
Triglycerides: 99 mg/dL (ref 0.0–149.0)
VLDL: 19.8 mg/dL (ref 0.0–40.0)

## 2013-12-28 ENCOUNTER — Other Ambulatory Visit: Payer: Self-pay | Admitting: Internal Medicine

## 2014-01-15 ENCOUNTER — Ambulatory Visit (INDEPENDENT_AMBULATORY_CARE_PROVIDER_SITE_OTHER): Payer: Medicare Other

## 2014-01-23 ENCOUNTER — Telehealth: Payer: Self-pay | Admitting: Internal Medicine

## 2014-01-23 ENCOUNTER — Ambulatory Visit (INDEPENDENT_AMBULATORY_CARE_PROVIDER_SITE_OTHER): Payer: Medicare Other | Admitting: Family Medicine

## 2014-01-23 VITALS — BP 126/70 | HR 62 | Temp 98.2°F | Resp 16 | Ht 65.0 in | Wt 118.0 lb

## 2014-01-23 DIAGNOSIS — E119 Type 2 diabetes mellitus without complications: Secondary | ICD-10-CM

## 2014-01-23 DIAGNOSIS — F509 Eating disorder, unspecified: Secondary | ICD-10-CM

## 2014-01-23 DIAGNOSIS — R634 Abnormal weight loss: Secondary | ICD-10-CM

## 2014-01-23 NOTE — Telephone Encounter (Signed)
Mailbox full

## 2014-01-23 NOTE — Progress Notes (Signed)
This is a 73 year old woman with a known eating disorder. She comes in with her husband who has COPD and she's quite worried about him.  Patient notes that she's lost 14 pounds in the last year. She eats small amounts at a time frequently. She does not purge. She does have chronic constipation however. Lately she's been taking MiraLax to help with this.  Patient is very worried about what to keep because she has had history of diabetes. She's had no hypoglycemic spells lately but is feeling weaker.  Patient has no polyuria, polydipsia or change in vision. Her last A1c was normal.  Objective: No acute distress, mildly cachectic-appearing woman.  HEENT: Unremarkable Chest: Clear Heart: Regular without tachycardia, gallop, rub Abdomen: Soft nontender with no HSM or masses Skin: No rash Extremities: No edema  Assessment: Eating disorder, chronic anxiety, controlled diabetes.  Plan: I've asked patient to take at least one can of Ensure per day. I will set her up with an appointment with Dr. Meredith Staggers next week to take up the issue of in the eating disorder.   , Sheila Oats.D.

## 2014-01-23 NOTE — Telephone Encounter (Signed)
Pt needs blood work from April 15.

## 2014-02-01 ENCOUNTER — Ambulatory Visit (INDEPENDENT_AMBULATORY_CARE_PROVIDER_SITE_OTHER): Payer: Medicare Other | Admitting: Family Medicine

## 2014-02-01 VITALS — BP 164/92 | HR 59 | Temp 97.7°F | Resp 20 | Ht 65.0 in | Wt 115.5 lb

## 2014-02-01 DIAGNOSIS — I10 Essential (primary) hypertension: Secondary | ICD-10-CM

## 2014-02-01 DIAGNOSIS — E039 Hypothyroidism, unspecified: Secondary | ICD-10-CM

## 2014-02-01 DIAGNOSIS — K5900 Constipation, unspecified: Secondary | ICD-10-CM

## 2014-02-01 DIAGNOSIS — E119 Type 2 diabetes mellitus without complications: Secondary | ICD-10-CM

## 2014-02-01 LAB — POCT GLYCOSYLATED HEMOGLOBIN (HGB A1C): Hemoglobin A1C: 6.2

## 2014-02-01 MED ORDER — LACTULOSE 10 GM/15ML PO SOLN: 10.0000 g | Freq: Every day | ORAL | Status: DC | PRN

## 2014-02-01 NOTE — Progress Notes (Signed)
Subjective:    Patient ID: [name], female    DOB: 04/02/1941, 73 y.o.   MRN: [id number]  HPI Chief Complaint  Patient presents with   Abdominal Pain   Gas   Constipation   Eating Disorder   This chart was scribed for Erin Sidle, MD by [name], ED Scribe. This patient was seen in room 3 and the patient's care was started at 3:15 PM.  HPI Comments: [name] is a 73 y.o. female who presents to the [facility] complaining of abdominal pain onset 3 days with associated constipation, gas and decreased appetite. She describes the pain as cramping. Pt reports she was taking miralax. As of today she states her bowels are fine. She has been taking OTC medication and has been drinking warm prune juice to clean her system out. Pt reports adominal pain last night due to the gas which was relieved by warm prune juice.  Pt has taken crestor and lipitor in the past but reports medication gave her muscle spasms. Pt reports BP was 280/99 last night.  Pt has h/o eating disorder. Pt reports that she disciplines herself on what to eat and what not to eat. She reports weighing 113lb  this morning and that she has not been this small since highschool. Pt reports she has stopped taking thyroid medication a month ago. She believes the medication causes her to lose weight.     Past Medical History  Diagnosis Date   Atrial fibrillation    COPD (chronic obstructive pulmonary disease)    Hyperlipidemia    Hypothyroidism    Arthritis    Diabetes mellitus    History of EMG 11/00    negative   Allergies  Allergen Reactions   Atorvastatin Other (See Comments)    Muscle aches   Hydrochlorothiazide W-Triamterene     REACTION: palpitations   Penicillins     REACTION: urticaria (hives)   Statins     Subjective myalgia   Prior to Admission medications   Medication Sig Start Date End Date Taking? Authorizing Provider  aspirin 81 MG tablet Take 81 mg  by mouth daily.    Yes Historical Provider, MD  diltiazem (CARDIZEM CD) 300 MG 24 hr capsule Take 300 mg by mouth daily.     Yes Historical Provider, MD  metFORMIN (GLUCOPHAGE) 500 MG tablet Take 500 mg by mouth 2 (two) times daily with a meal.   Yes Historical Provider, MD  metoprolol (LOPRESSOR) 50 MG tablet TAKE 1.5 TABLETS BY MOUTH EVERY DAY   Yes Erin Mole Swords, MD  polyethylene glycol (MIRALAX / GLYCOLAX) packet Take 17 g by mouth daily.     Yes Historical Provider, MD  ramipril (ALTACE) 10 MG capsule TAKE 1 CAPSULE BY MOUTH TWICE A DAY 03/25/13  Yes Erin Magnus, MD   Review of Systems  Constitutional: Negative for appetite change.  Gastrointestinal: Positive for abdominal pain and constipation.       Objective:   Physical Exam  Nursing note and vitals reviewed. Constitutional: She is oriented to person, place, and time. She appears well-developed and well-nourished. No distress.  HENT:  Head: Normocephalic and atraumatic.  Eyes: EOM are normal.  Neck: Neck supple. No tracheal deviation present.  Cardiovascular: Normal rate.   118/90  Pulmonary/Chest: Effort normal. No respiratory distress.  Musculoskeletal: Normal range of motion.  Neurological: She is alert and oriented to person, place, and time.  Skin: Skin is warm and dry.  Psychiatric: She has a normal mood and affect. Her behavior is normal.       Assessment & Plan:   1. Hypothyroid   2. Constipation   3. Type 2 diabetes mellitus    Meds ordered this encounter  Medications   lactulose (CHRONULAC) 10 GM/15ML solution    Sig: Take 15 mLs (10 g total) by mouth daily as needed for mild constipation.    Dispense:  240 mL    Refill:  0   Hypothyroid - Plan: TSH  Constipation - Plan: lactulose (CHRONULAC) 10 GM/15ML solution  Type 2 diabetes mellitus - Plan: POCT glycosylated hemoglobin (Hb A1C), Comprehensive metabolic panel  Signed, Erin SidleKurt Lauenstein, MD

## 2014-02-02 LAB — COMPREHENSIVE METABOLIC PANEL
ALT: 13 U/L (ref 0–35)
AST: 16 U/L (ref 0–37)
Albumin: 4.3 g/dL (ref 3.5–5.2)
Alkaline Phosphatase: 65 U/L (ref 39–117)
BUN: 15 mg/dL (ref 6–23)
CO2: 25 mEq/L (ref 19–32)
Calcium: 10.2 mg/dL (ref 8.4–10.5)
Chloride: 94 mEq/L — ABNORMAL LOW (ref 96–112)
Creat: 1.19 mg/dL — ABNORMAL HIGH (ref 0.50–1.10)
Glucose, Bld: 109 mg/dL — ABNORMAL HIGH (ref 70–99)
Potassium: 4 mEq/L (ref 3.5–5.3)
Sodium: 132 mEq/L — ABNORMAL LOW (ref 135–145)
Total Bilirubin: 0.6 mg/dL (ref 0.2–1.2)
Total Protein: 7.3 g/dL (ref 6.0–8.3)

## 2014-02-02 LAB — TSH: TSH: 0.686 u[IU]/mL (ref 0.350–4.500)

## 2014-02-05 ENCOUNTER — Ambulatory Visit (HOSPITAL_COMMUNITY)
Admission: RE | Admit: 2014-02-05 | Discharge: 2014-02-05 | Disposition: A | Discharge status: Home / Self Care | Payer: Medicare Other | Source: Ambulatory Visit | Attending: Internal Medicine | Admitting: Internal Medicine

## 2014-02-05 DIAGNOSIS — Z1231 Encounter for screening mammogram for malignant neoplasm of breast: Secondary | ICD-10-CM

## 2014-02-06 ENCOUNTER — Other Ambulatory Visit: Payer: Self-pay | Admitting: Internal Medicine

## 2014-03-03 ENCOUNTER — Other Ambulatory Visit: Payer: Self-pay | Admitting: Internal Medicine

## 2014-03-05 ENCOUNTER — Other Ambulatory Visit: Payer: Self-pay | Admitting: Internal Medicine

## 2014-03-20 ENCOUNTER — Other Ambulatory Visit: Payer: Self-pay | Admitting: Internal Medicine

## 2014-04-01 ENCOUNTER — Other Ambulatory Visit: Payer: Self-pay | Admitting: Internal Medicine

## 2014-04-03 ENCOUNTER — Telehealth: Payer: Self-pay

## 2014-04-03 NOTE — Telephone Encounter (Signed)
Called and spoke with pt for diabetic bundle. Pt's [facility].  Pt is not sure what she is going to do at this time.  Pt will call our office back.

## 2014-04-21 ENCOUNTER — Encounter: Payer: Medicare Other | Admitting: Family Medicine

## 2014-04-21 DIAGNOSIS — Z0289 Encounter for other administrative examinations: Secondary | ICD-10-CM

## 2014-04-21 NOTE — Progress Notes (Signed)
Error   This encounter was created in error - please disregard. 

## 2014-04-27 ENCOUNTER — Other Ambulatory Visit: Payer: Self-pay | Admitting: Internal Medicine

## 2014-04-29 ENCOUNTER — Ambulatory Visit (INDEPENDENT_AMBULATORY_CARE_PROVIDER_SITE_OTHER): Payer: Medicare Other | Admitting: Family Medicine

## 2014-04-29 ENCOUNTER — Encounter: Payer: Self-pay | Admitting: Family Medicine

## 2014-04-29 VITALS — BP 104/80 | HR 58 | Temp 98.0°F | Ht 65.0 in | Wt 112.5 lb

## 2014-04-29 DIAGNOSIS — F1010 Alcohol abuse, uncomplicated: Secondary | ICD-10-CM

## 2014-04-29 DIAGNOSIS — E785 Hyperlipidemia, unspecified: Secondary | ICD-10-CM

## 2014-04-29 DIAGNOSIS — F329 Major depressive disorder, single episode, unspecified: Secondary | ICD-10-CM

## 2014-04-29 DIAGNOSIS — I10 Essential (primary) hypertension: Secondary | ICD-10-CM

## 2014-04-29 DIAGNOSIS — F3289 Other specified depressive episodes: Secondary | ICD-10-CM

## 2014-04-29 DIAGNOSIS — E1129 Type 2 diabetes mellitus with other diabetic kidney complication: Secondary | ICD-10-CM

## 2014-04-29 DIAGNOSIS — E1122 Type 2 diabetes mellitus with diabetic chronic kidney disease: Secondary | ICD-10-CM

## 2014-04-29 DIAGNOSIS — E119 Type 2 diabetes mellitus without complications: Secondary | ICD-10-CM

## 2014-04-29 DIAGNOSIS — Z23 Encounter for immunization: Secondary | ICD-10-CM

## 2014-04-29 DIAGNOSIS — F411 Generalized anxiety disorder: Secondary | ICD-10-CM

## 2014-04-29 DIAGNOSIS — N189 Chronic kidney disease, unspecified: Secondary | ICD-10-CM

## 2014-04-29 DIAGNOSIS — F17200 Nicotine dependence, unspecified, uncomplicated: Secondary | ICD-10-CM

## 2014-04-29 DIAGNOSIS — R634 Abnormal weight loss: Secondary | ICD-10-CM

## 2014-04-29 LAB — CBC WITH DIFFERENTIAL/PLATELET
BASOS ABS: 0 10*3/uL (ref 0.0–0.1)
BASOS PCT: 0.4 % (ref 0.0–3.0)
Eosinophils Absolute: 0.2 10*3/uL (ref 0.0–0.7)
Eosinophils Relative: 3.8 % (ref 0.0–5.0)
HCT: 41.6 % (ref 36.0–46.0)
HEMOGLOBIN: 13.6 g/dL (ref 12.0–15.0)
Lymphocytes Relative: 32.9 % (ref 12.0–46.0)
Lymphs Abs: 1.9 10*3/uL (ref 0.7–4.0)
MCHC: 32.8 g/dL (ref 30.0–36.0)
MCV: 88.8 fl (ref 78.0–100.0)
MONO ABS: 0.4 10*3/uL (ref 0.1–1.0)
Monocytes Relative: 6.9 % (ref 3.0–12.0)
NEUTROS ABS: 3.3 10*3/uL (ref 1.4–7.7)
Neutrophils Relative %: 56 % (ref 43.0–77.0)
Platelets: 204 10*3/uL (ref 150.0–400.0)
RBC: 4.69 Mil/uL (ref 3.87–5.11)
RDW: 15.5 % (ref 11.5–15.5)
WBC: 5.9 10*3/uL (ref 4.0–10.5)

## 2014-04-29 LAB — COMPREHENSIVE METABOLIC PANEL
ALT: 14 U/L (ref 0–35)
AST: 17 U/L (ref 0–37)
Albumin: 3.9 g/dL (ref 3.5–5.2)
Alkaline Phosphatase: 58 U/L (ref 39–117)
BUN: 8 mg/dL (ref 6–23)
CALCIUM: 9.8 mg/dL (ref 8.4–10.5)
CHLORIDE: 98 meq/L (ref 96–112)
CO2: 27 mEq/L (ref 19–32)
CREATININE: 0.8 mg/dL (ref 0.4–1.2)
GFR: 86.17 mL/min (ref >60.00)
Glucose, Bld: 125 mg/dL — ABNORMAL HIGH (ref 70–99)
Potassium: 3.3 mEq/L — ABNORMAL LOW (ref 3.5–5.1)
Sodium: 135 mEq/L (ref 135–145)
Total Bilirubin: 0.6 mg/dL (ref 0.2–1.2)
Total Protein: 7 g/dL (ref 6.0–8.3)

## 2014-04-29 LAB — MICROALBUMIN / CREATININE URINE RATIO
Creatinine,U: 55.7 mg/dL
MICROALB/CREAT RATIO: 4.9 mg/g (ref 0.0–30.0)
Microalb, Ur: 2.7 mg/dL — ABNORMAL HIGH (ref 0.0–1.9)

## 2014-04-29 LAB — LIPID PANEL
Cholesterol: 225 mg/dL — ABNORMAL HIGH (ref 0–200)
HDL: 80.1 mg/dL (ref >39.00)
LDL Cholesterol: 123 mg/dL — ABNORMAL HIGH (ref 0–99)
NonHDL: 144.9
Total CHOL/HDL Ratio: 3
Triglycerides: 108 mg/dL (ref 0.0–149.0)
VLDL: 21.6 mg/dL (ref 0.0–40.0)

## 2014-04-29 LAB — HEMOGLOBIN A1C: HEMOGLOBIN A1C: 6.6 % — AB (ref 4.6–6.5)

## 2014-04-29 LAB — TSH: TSH: 0.1 u[IU]/mL — ABNORMAL LOW (ref 0.35–4.50)

## 2014-04-29 NOTE — Addendum Note (Signed)
Addended by: Johnella Moloney on: 04/29/2014 11:31 AM   Modules accepted: Orders

## 2014-04-29 NOTE — Progress Notes (Signed)
No chief complaint on file.   HPI:  [name], a 73 yo patient of Dr. Cato Mulligan, is here for an acute visit for called for the diabetic bundle to schedule appointment.   1) DM with renal manifestations: -called to do diabetic bundle -meds:metformin 500 bid -last eye exam: does not remember when, but reports last year-reports she sees a nephrologist  2)HTN: -BP elevated in other office -meds: ramipril 10, metoprolol 50, diltiazem, ASA -denies: CP, SOB, swelling, palpitations -hx s. Fib  3)Hypothyroid: -med: synthroid 75 mcg -denies: hot/cold intol, skin changes, constipation, palpitation  4-5)T[place]occo and Alcohol Abuse: -drinks a lot - at least 3 beers per day -continues to smoke -doctor swords has tried to get her to quit drinking for a long time but "she loves beer" -she does not want to quit or cut back - helps her stress   6)Depression/Anxiety: -husband had dx of cancer -this is improving, but every time she gets stress or depressed can't eat and loses weight -denies: SI, thoughts of self harm, hemoptysis, fevers, blood in stoods, vomiting, nausea, change in bowels  NOTE: looks like she sees [facility] for most of her care  ROS: See pertinent positives and negatives per HPI.  Past Medical History  Diagnosis Date  . Atrial fibrillation   . COPD (chronic obstructive pulmonary disease)   . Hyperlipidemia   . Hypothyroidism   . Arthritis   . Diabetes mellitus   . History of EMG 11/00    negative    Past Surgical History  Procedure Laterality Date  . Ankle fracture surgery      left  . Thyroid colloid cyst   12/00  . Coronary angioplasty with stent placement  06/16/05    Family History  Problem Relation Age of Onset  . Cancer Mother     breast    History   Social History  . Marital Status: Married    Spouse Name: N/A    Number of Children: N/A  . Years of Education: N/A   Social History Main Topics  . Smoking status: Current Every Day Smoker -- 0.50  packs/day    Types: Cigarettes  . Smokeless tobacco: Never Used  . Alcohol Use: 30.0 oz/week    50 Cans of beer per week     Comment: (1) 40 oz, (2) cans beer daily   . Drug Use: No  . Sexual Activity: No   Other Topics Concern  . None   Social History Narrative  . None    Current outpatient prescriptions:aspirin 81 MG tablet, Take 81 mg by mouth daily. , Disp: , Rfl: ;  diltiazem (CARDIZEM CD) 300 MG 24 hr capsule, Take 300 mg by mouth daily.  , Disp: , Rfl: ;  levothyroxine (SYNTHROID, LEVOTHROID) 75 MCG tablet, TAKE 1 TABLET BY MOUTH EVERY DAY, Disp: 90 tablet, Rfl: 1;  metFORMIN (GLUCOPHAGE) 500 MG tablet, Take 500 mg by mouth 2 (two) times daily with a meal., Disp: , Rfl:  metoprolol (LOPRESSOR) 50 MG tablet, TAKE 1 AND 1/2 TABLETS BY MOUTH DAILY, Disp: 90 tablet, Rfl: 0;  PARoxetine (PAXIL) 20 MG tablet, Take 1 tablet (20 mg total) by mouth daily. PATIENT NEEDS OFFICE VISIT FOR ADDITIONAL REFILLS, Disp: 90 tablet, Rfl: 0;  polyethylene glycol (MIRALAX / GLYCOLAX) packet, Take 17 g by mouth daily.  , Disp: , Rfl: ;  ramipril (ALTACE) 10 MG capsule, TAKE 1 CAPSULE BY MOUTH TWICE A DAY, Disp: 180 capsule, Rfl: 1 [DISCONTINUED] flecainide (TAMBOCOR) 100 MG tablet,  Take 100 mg by mouth 2 (two) times daily.  , Disp: , Rfl: ;  [DISCONTINUED] hydrochlorothiazide 25 MG tablet, Take 25 mg by mouth daily.  , Disp: , Rfl:   EXAM:  Filed Vitals:   04/29/14 1029  BP: 104/80  Pulse: 58  Temp: 98 F (36.7 C)    Body mass index is 18.72 kg/(m^2).  GENERAL: vitals reviewed and listed above, alert, oriented, appears well hydrated and in no acute distress  HEENT: atraumatic, conjunttiva clear, no obvious abnormalities on inspection of external nose and ears  NECK: no obvious masses on inspection  LUNGS: clear to auscultation bilaterally, no wheezes, rales or rhonchi, good air movement  CV: HRRR, no peripheral edema  MS: moves all extremities without noticeable abnormality  PSYCH:  pleasant and cooperative, no obvious depression or anxiety  ASSESSMENT AND PLAN:  Discussed the following assessment and plan:  DIABETES-TYPE 2  HYPERLIPIDEMIA - Plan: Lipid Panel  ABUSE, ALCOHOL, CONTINUOUS - Plan: Ambulatory referral to Psychiatry, Amb ref to Medical Nutrition Therapy-[facility] -advised of risks, safe drinking level, advised to quit -she feels she needs help with this and referred to psych  TOBACCO USE -smoking cessation counseling 3-10 minutes, advised of risks, cancer screening declined  DEPRESSION - Plan: Ambulatory referral to Psychiatry -referral to psych  HYPERTENSION, ESSENTIAL NOS  Loss of weight - Plan: TSH, CBC with Differential, CMP, Amb ref to Medical Nutrition Therapy-[facility] -suspect related to psych issues, ? Eating disorder  , alcohol use and tobacco use -discussed other causes including cancer -she opted for basic labs, psych referral and close follow up  Type 2 diabetes mellitus with diabetic chronic kidney disease - Plan: Hemoglobin A1c, Microalbumin/Creatinine Ratio, Urine, Amb ref to Medical Nutrition Therapy-[facility] Advised eye exam  Generalized anxiety disorder - Plan: Ambulatory referral to Psychiatry  -Patient advised to return or notify a doctor immediately if symptoms worsen or persist or new concerns arise.  Patient Instructions  -We placed a referral for you as discussed to the psychiatrist to help with the alcohol abuse and depression and stress and to the nutritionist. It usually takes about 1-2 weeks to process and schedule this referral. If you have not heard from [place] regarding this appointment in 2 weeks please contact our office.  -eat 3 healthy meals and snacks daily  -please cut back and plan to quit alcohol and smoking  -follow up in 1 month      Atul Delucia R.

## 2014-04-29 NOTE — Progress Notes (Signed)
Pre visit review using our clinic review tool, if applicable. No additional management support is needed unless otherwise documented below in the visit note. 

## 2014-04-29 NOTE — Patient Instructions (Signed)
-  We placed a referral for you as discussed to the psychiatrist to help with the alcohol abuse and depression and stress and to the nutritionist. It usually takes about 1-2 weeks to process and schedule this referral. If you have not heard from [place] regarding this appointment in 2 weeks please contact our office.  -eat 3 healthy meals and snacks daily  -please cut back and plan to quit alcohol and smoking  -follow up in 1 month

## 2014-04-30 MED ORDER — LEVOTHYROXINE SODIUM 50 MCG PO TABS: 50.0000 ug | ORAL_TABLET | Freq: Every day | ORAL | Status: DC

## 2014-04-30 MED ORDER — PRAVASTATIN SODIUM 20 MG PO TABS: 20.0000 mg | ORAL_TABLET | Freq: Every day | ORAL | Status: DC

## 2014-04-30 NOTE — Addendum Note (Signed)
Addended by: Johnella Moloney on: 04/30/2014 04:47 PM   Modules accepted: Orders

## 2014-05-26 NOTE — Telephone Encounter (Signed)
Please refer to sleep study note.

## 2014-06-08 ENCOUNTER — Ambulatory Visit (INDEPENDENT_AMBULATORY_CARE_PROVIDER_SITE_OTHER): Payer: Medicare Other | Admitting: Family Medicine

## 2014-06-08 ENCOUNTER — Encounter: Payer: Self-pay | Admitting: Family Medicine

## 2014-06-08 VITALS — BP 150/74 | HR 54 | Temp 97.9°F | Ht 65.0 in | Wt 113.5 lb

## 2014-06-08 DIAGNOSIS — F32A Depression, unspecified: Secondary | ICD-10-CM

## 2014-06-08 DIAGNOSIS — Z720 Tobacco use: Secondary | ICD-10-CM

## 2014-06-08 DIAGNOSIS — F17200 Nicotine dependence, unspecified, uncomplicated: Secondary | ICD-10-CM

## 2014-06-08 DIAGNOSIS — F419 Anxiety disorder, unspecified: Secondary | ICD-10-CM

## 2014-06-08 DIAGNOSIS — E118 Type 2 diabetes mellitus with unspecified complications: Secondary | ICD-10-CM

## 2014-06-08 DIAGNOSIS — F329 Major depressive disorder, single episode, unspecified: Secondary | ICD-10-CM

## 2014-06-08 DIAGNOSIS — E785 Hyperlipidemia, unspecified: Secondary | ICD-10-CM

## 2014-06-08 DIAGNOSIS — I10 Essential (primary) hypertension: Secondary | ICD-10-CM

## 2014-06-08 DIAGNOSIS — E876 Hypokalemia: Secondary | ICD-10-CM

## 2014-06-08 DIAGNOSIS — F418 Other specified anxiety disorders: Secondary | ICD-10-CM

## 2014-06-08 DIAGNOSIS — E039 Hypothyroidism, unspecified: Secondary | ICD-10-CM

## 2014-06-08 DIAGNOSIS — F1010 Alcohol abuse, uncomplicated: Secondary | ICD-10-CM

## 2014-06-08 LAB — BASIC METABOLIC PANEL
BUN: 15 mg/dL (ref 6–23)
CHLORIDE: 97 meq/L (ref 96–112)
CO2: 28 mEq/L (ref 19–32)
Calcium: 9.6 mg/dL (ref 8.4–10.5)
Creatinine, Ser: 0.9 mg/dL (ref 0.4–1.2)
GFR: 82.69 mL/min (ref >60.00)
GLUCOSE: 151 mg/dL — AB (ref 70–99)
POTASSIUM: 3.6 meq/L (ref 3.5–5.1)
Sodium: 133 mEq/L — ABNORMAL LOW (ref 135–145)

## 2014-06-08 NOTE — Progress Notes (Signed)
Pre visit review using our clinic review tool, if applicable. No additional management support is needed unless otherwise documented below in the visit note. 

## 2014-06-08 NOTE — Patient Instructions (Signed)
-  please eat at least 3 healthy meals per day  -please CUT BACK on drinking to no more then 1 regular sized beer per day  -CALL TODAY to schedule an appointment with the psychiatrist  -set up nurse visit in 2 weeks to check the blood pressure  -set up lab appointment in 6 weeks to check the thyroid level on the new dose of medication  -SET up NEW PATIENT VISIT with Dr. Durene CalHunter or Dr. Selena BattenKim or Adline MangoPadonda Campbell

## 2014-06-08 NOTE — Progress Notes (Signed)
No chief complaint on file.   HPI:  Acute visit to follow up on:  Note: prior Swords pt, looks like [facility]. However, was contacted by our office to come in for diabetic bundle and was put on my schedule last month. Here to follow up on those issues. Will discuss PCP options today as well.  1) DM with renal manifestations/HLD:  -Hgba1c 6.6 -started statin after last visit -meds:metformin 500 bid  -last eye exam: does not remember when, reports last year -reports she sees a nephrologist   2)HTN:  -BP elevated in other office  -meds: ramipril 10, metoprolol 50, diltiazem, ASA - took her BP meds late today - on the way here -denies: CP, SOB, swelling, palpitations  -hx s. Fib   3)Hypothyroid:  -med: over treated on labs last visit and decreased synthroid -denies: hot/cold intol, skin changes, constipation, palpitation   STRONGLY encouraged her to see Psych for the anxiety/depression and alcohol abuse  4-5)Tobocco and Alcohol Abuse:  -drinks a lot - at least 3 beers per day  -continues to smoke  -doctor swords has tried to get her to quit drinking for a long time but "she loves beer"  -she does not want to quit or cut back - helps her stress   6)Depression/Anxiety:  -husband had dx of cancer  -this is improving, but every time she gets stress or depressed can't eat and loses weight  -denies: SI, thoughts of self harm, hemoptysis, fevers, blood in stoods, vomiting, nausea, change in bowels  -did labs last visit and thyroid over treated - this likely was contributing    ROS: See pertinent positives and negatives per HPI.  Past Medical History  Diagnosis Date  . Atrial fibrillation   . COPD (chronic obstructive pulmonary disease)   . Hyperlipidemia   . Hypothyroidism   . Arthritis   . Diabetes mellitus   . History of EMG 11/00    negative    Past Surgical History  Procedure Laterality Date  . Ankle fracture surgery      left  . Thyroid  colloid cyst   12/00  . Coronary angioplasty with stent placement  06/16/05    Family History  Problem Relation Age of Onset  . Cancer Mother     breast    History   Social History  . Marital Status: Married    Spouse Name: N/A    Number of Children: N/A  . Years of Education: N/A   Social History Main Topics  . Smoking status: Current Every Day Smoker -- 0.50 packs/day    Types: Cigarettes  . Smokeless tobacco: Never Used  . Alcohol Use: 30.0 oz/week    50 Cans of beer per week     Comment: (1) 40 oz, (2) cans beer daily   . Drug Use: No  . Sexual Activity: No   Other Topics Concern  . None   Social History Narrative  . None    Current outpatient prescriptions:aspirin 81 MG tablet, Take 81 mg by mouth daily. , Disp: , Rfl: ;  diltiazem (CARDIZEM CD) 300 MG 24 hr capsule, Take 300 mg by mouth daily.  , Disp: , Rfl: ;  levothyroxine (SYNTHROID, LEVOTHROID) 50 MCG tablet, Take 1 tablet (50 mcg total) by mouth daily., Disp: 30 tablet, Rfl: 1;  metFORMIN (GLUCOPHAGE) 500 MG tablet, Take 500 mg by mouth 2 (two) times daily with a meal., Disp: , Rfl:  metoprolol (LOPRESSOR) 50 MG tablet, TAKE 1  AND 1/2 TABLETS BY MOUTH DAILY, Disp: 90 tablet, Rfl: 0;  PARoxetine (PAXIL) 20 MG tablet, Take 1 tablet (20 mg total) by mouth daily. PATIENT NEEDS OFFICE VISIT FOR ADDITIONAL REFILLS, Disp: 90 tablet, Rfl: 0;  polyethylene glycol (MIRALAX / GLYCOLAX) packet, Take 17 g by mouth daily.  , Disp: , Rfl:  pravastatin (PRAVACHOL) 20 MG tablet, Take 1 tablet (20 mg total) by mouth daily., Disp: 30 tablet, Rfl: 1;  ramipril (ALTACE) 10 MG capsule, TAKE 1 CAPSULE BY MOUTH TWICE A DAY, Disp: 180 capsule, Rfl: 1;  [DISCONTINUED] flecainide (TAMBOCOR) 100 MG tablet, Take 100 mg by mouth 2 (two) times daily.  , Disp: , Rfl: ;  [DISCONTINUED] hydrochlorothiazide 25 MG tablet, Take 25 mg by mouth daily.  , Disp: , Rfl:   EXAM:  Filed Vitals:   06/08/14 1320  BP: 150/74  Pulse: 54  Temp: 97.9 F (36.6  C)    Body mass index is 18.89 kg/(m^2).  GENERAL: vitals reviewed and listed above, alert, oriented, appears well hydrated and in no acute distress  HEENT: atraumatic, conjunttiva clear, no obvious abnormalities on inspection of external nose and ears  NECK: no obvious masses on inspection  LUNGS: clear to auscultation bilaterally, no wheezes, rales or rhonchi, good air movement   CV: HRRR, no peripheral edema  MS: moves all extremities without noticeable abnormality  PSYCH: pleasant and cooperative, no obvious depression or anxiety  ASSESSMENT AND PLAN:  Discussed the following assessment and plan:  Hypothyroidism, unspecified hypothyroidism type - Plan: TSH  Type 2 diabetes mellitus with complication  Hyperlipemia  TOBACCO USE  Anxiety and depression  Essential hypertension - Plan: Basic metabolic panel  ABUSE, ALCOHOL, CONTINUOUS  Hypokalemia - Plan: Basic metabolic panel  -recheck potassium today -advised to cut back on alcohol -advised she needs to see psych and [place] [facility] number provided -meds every day and recheck BP in 2 weeks with nurse visit -recheck tsh in 6 weeks -advised to quit smoking -advised 3 healthy meals daily -advised to set up new patient visit with me, Dr. Durene Cal or Oran Rein -Patient advised to return or notify a doctor immediately if symptoms worsen or persist or new concerns arise.  Patient Instructions  -please eat at least 3 healthy meals per day  -please CUT BACK on drinking to no more then 1 regular sized beer per day  -CALL TODAY to schedule an appointment with the psychiatrist  -set up nurse visit in 2 weeks to check the blood pressure  -set up lab appointment in 6 weeks to check the thyroid level on the new dose of medication  -SET up NEW PATIENT VISIT with Dr. Durene Cal or Dr. Selena Batten or Majel Homer, Dahlia Client R.

## 2014-06-09 ENCOUNTER — Telehealth: Payer: Self-pay | Admitting: Family Medicine

## 2014-06-09 NOTE — Telephone Encounter (Signed)
emmi mailed  °

## 2014-06-16 ENCOUNTER — Encounter: Payer: Self-pay | Admitting: *Deleted

## 2014-06-16 ENCOUNTER — Encounter: Payer: Medicare Other | Attending: Family Medicine | Admitting: *Deleted

## 2014-06-16 VITALS — Ht 64.0 in | Wt 113.8 lb

## 2014-06-16 DIAGNOSIS — Z713 Dietary counseling and surveillance: Secondary | ICD-10-CM | POA: Diagnosis not present

## 2014-06-16 DIAGNOSIS — E1121 Type 2 diabetes mellitus with diabetic nephropathy: Secondary | ICD-10-CM | POA: Insufficient documentation

## 2014-06-16 NOTE — Patient Instructions (Signed)
Plan:  Continue to spread your carbohydrate foods evenly throughout the day Include protein in moderation with your meals and snacks Consider supplements to drink as a beverage to increase your calories, such as Retail bankerCarnation Essentials Powder, Boost Plus, or Glucerna

## 2014-06-16 NOTE — Progress Notes (Signed)
  Medical Nutrition Therapy:  Appt start time: 1430 end time:  1530.  Assessment:  Primary concerns today: has had problem with weight loss recently as well as having Diabetes. Her husband was diagnosed with lung cancer last year but is in remission now. She has more work to do around the house with him not feeling well, which tires her out. Weight today is 113.8 pounds, she states her normal weight is around 125 - 130 pounds. She states she is drinking beer now, 32 oz one a day divided into several servings.  Current A1c is 6.6%  Preferred Learning Style:   No preference indicated   Learning Readiness:   Contemplating  MEDICATIONS: see list, Diabetes medication is Metformin   DIETARY INTAKE:  24-hr recall:  B ( AM): skips often, may have coffee with Hazelnut creamer and whole milk and maybe 1 egg, 1 light bologna, 2 slices tomato,   Snk ( AM): diluted wonton soup with added noodles L ( PM): thin bread with [place]aw[place]urkey and cheese, onions on a sandwich, and wonton soup, water Snk ( PM): variety of foods during the afternoon while she is doing chores, doesn't sit down to eat D ( PM): meat, starch, vegetables, occasionally a piece of bread Snk ( PM):  Beverages: coffee, water, beer  Usual physical activity: activities of daily living  Estimated energy needs: 1500 calories 170 g carbohydrates 112 g protein 42 g fat  Progress Towards Goal(s):  In progress.   Nutritional Diagnosis:  NB-1.1 Food and nutrition-related knowledge deficit As related to Diabetes.  As evidenced by A1c of 6.6%.    Intervention:  Nutrition counseling and diabetes education initiated. Spent this visit discussing options for increasing her calorie intake to offset recent undesired weight loss.  Plan:  Continue to spread your carbohydrate foods evenly throughout the day Include protein in moderation with your meals and snacks Consider supplements to drink as a beverage to increase your calories, such as  Retail bankerCarnation Essentials Powder, Boost Plus, or Glucerna  Teaching Method Utilized:Auditory  Handouts given during visit include:  Sample of Carnation Essentials   Sample of Boost Plus  Barriers to learning/adherence to lifestyle change: excessive alcohol intake, caring for ailing husband  Demonstrated degree of understanding via:  Teach Back   Monitoring/Evaluation:  Dietary intake, exercise, use of caloric supplements to increase calorie intake, and body weight in 6 week(s).

## 2014-06-17 ENCOUNTER — Telehealth: Payer: Self-pay | Admitting: Family Medicine

## 2014-06-17 NOTE — Telephone Encounter (Signed)
Pt called to say that she received her results for everything except her cholestrol and would like to know her numbers

## 2014-06-17 NOTE — Telephone Encounter (Signed)
I reviewed the results from the lipid panel done in August and the pt stated she thought this was done at her last appt on 10/5.  Patient questions when to repeat cholesterol testing?

## 2014-06-19 NOTE — Telephone Encounter (Signed)
Better to check cholesterol after on the statin for awhile. Can add on Lipid Panel to lab appt in November - come fasting to that appt.

## 2014-06-19 NOTE — Telephone Encounter (Signed)
Left message on machine for patient to return our call 

## 2014-06-22 ENCOUNTER — Ambulatory Visit: Payer: Medicare Other | Admitting: *Deleted

## 2014-06-23 NOTE — Telephone Encounter (Signed)
Patient informed.  I also checked on the pt as she missed the appt yesterday with me for a BP check and she stated she forgot as her husband is sick and I advised her to call back for an appt.

## 2014-06-29 ENCOUNTER — Other Ambulatory Visit: Payer: Self-pay | Admitting: Family Medicine

## 2014-07-04 ENCOUNTER — Other Ambulatory Visit: Payer: Self-pay | Admitting: Internal Medicine

## 2014-07-07 NOTE — Telephone Encounter (Signed)
She has no showed he recent appts and did not ever come back to recheck her BP. Please schedule appt with me to recheck in the next 1 month BP. Refill for 1 month.

## 2014-07-13 ENCOUNTER — Other Ambulatory Visit: Payer: Medicare Other

## 2014-07-27 ENCOUNTER — Ambulatory Visit: Payer: Medicare Other | Admitting: *Deleted

## 2014-08-04 ENCOUNTER — Encounter: Payer: Medicare Other | Admitting: Family Medicine

## 2014-08-04 NOTE — Progress Notes (Signed)
Error   This encounter was created in error - please disregard. 

## 2014-08-16 ENCOUNTER — Other Ambulatory Visit: Payer: Self-pay | Admitting: Family Medicine

## 2014-08-18 ENCOUNTER — Encounter: Payer: Self-pay | Admitting: Family Medicine

## 2014-08-18 ENCOUNTER — Other Ambulatory Visit: Payer: Self-pay | Admitting: Family Medicine

## 2014-08-18 ENCOUNTER — Encounter: Payer: Self-pay | Admitting: Internal Medicine

## 2014-08-18 ENCOUNTER — Ambulatory Visit (INDEPENDENT_AMBULATORY_CARE_PROVIDER_SITE_OTHER): Payer: Medicare Other | Admitting: Family Medicine

## 2014-08-18 VITALS — BP 132/86 | HR 60 | Temp 97.7°F | Ht 64.0 in | Wt 112.8 lb

## 2014-08-18 DIAGNOSIS — F419 Anxiety disorder, unspecified: Secondary | ICD-10-CM

## 2014-08-18 DIAGNOSIS — E039 Hypothyroidism, unspecified: Secondary | ICD-10-CM

## 2014-08-18 DIAGNOSIS — I10 Essential (primary) hypertension: Secondary | ICD-10-CM

## 2014-08-18 DIAGNOSIS — F418 Other specified anxiety disorders: Secondary | ICD-10-CM

## 2014-08-18 DIAGNOSIS — F329 Major depressive disorder, single episode, unspecified: Secondary | ICD-10-CM

## 2014-08-18 DIAGNOSIS — R634 Abnormal weight loss: Secondary | ICD-10-CM

## 2014-08-18 DIAGNOSIS — K5900 Constipation, unspecified: Secondary | ICD-10-CM

## 2014-08-18 DIAGNOSIS — E785 Hyperlipidemia, unspecified: Secondary | ICD-10-CM

## 2014-08-18 DIAGNOSIS — E118 Type 2 diabetes mellitus with unspecified complications: Secondary | ICD-10-CM

## 2014-08-18 DIAGNOSIS — F1010 Alcohol abuse, uncomplicated: Secondary | ICD-10-CM

## 2014-08-18 DIAGNOSIS — Z720 Tobacco use: Secondary | ICD-10-CM

## 2014-08-18 DIAGNOSIS — F17200 Nicotine dependence, unspecified, uncomplicated: Secondary | ICD-10-CM

## 2014-08-18 LAB — TSH: TSH: 1.43 u[IU]/mL (ref 0.35–4.50)

## 2014-08-18 MED ORDER — LEVOTHYROXINE SODIUM 50 MCG PO TABS: ORAL_TABLET | ORAL | Status: AC

## 2014-08-18 NOTE — Progress Notes (Signed)
HPI:  Erin PlummerDollie M Brady is a pleasat 73 yo prior patient of Dr. Cato MulliganSwords whom unfortunately is an alcoholic and is poorly compliant with treatment recommendations or follow up. I advised her to seek care with a psychiatrist, alcohol rehab and with a nutritionist. She is establishing care with me and has a visit set up for this, but is here today to check her blood pressure.  HTN: -meds: ramipril 10, metoprolol 50, diltiazem, ASA -  -denies: CP, SOB, swelling, palpitations  -hx s. Fib    DM with renal manifestations/HLD:  -Hgba1c 6.6 -started statin after last visit -meds:metformin 500 bid  -last eye exam: does not remember when, reports last year -reports she sees a nephrologist   Hypothyroid:  -med: over treated on labs last visit and decreased synthroid - advised TSH recheck but she did not do this -denies: hot/cold intol, skin changes, constipation, palpitation   STRONGLY encouraged her cut back and to see Psych for the anxiety/depression and alcohol abuse  [place] and Alcohol Abuse:  -drinks a lot - at least 3 beers per day - usually more, refuses to quit -continues to smoke  -doctor swords has tried to get her to quit drinking for a long time but "she loves beer"  -she reports does not want to quit or cut back - helps her stress   Depression/Anxiety:  -husband had dx of cancer  -this is improving, but every time she gets stress or depressed can't eat and loses weight  -denies: SI, thoughts of self harm, hemoptysis, fevers, blood in stoods, vomiting, nausea, change in bowels  -did labs last visit and thyroid over treated - this likely was contributing  WEIGHT LOSS: -chronic, drinks a lot of beer -anxiety and depression per above -saw nutritionist but reports not doing any of the suggestions -often does not eat -chronic constipation   ROS: See pertinent positives and negatives per HPI.  Past Medical History  Diagnosis Date  . Atrial fibrillation   . COPD  (chronic obstructive pulmonary disease)   . Hyperlipidemia   . Hypothyroidism   . Arthritis   . Diabetes mellitus   . History of EMG 11/00    negative    Past Surgical History  Procedure Laterality Date  . Ankle fracture surgery      left  . Thyroid colloid cyst   12/00  . Coronary angioplasty with stent placement  06/16/05    Family History  Problem Relation Age of Onset  . Cancer Mother     breast    History   Social History  . Marital Status: Married    Spouse Name: N/A    Number of Children: N/A  . Years of Education: N/A   Social History Main Topics  . Smoking status: Current Every Day Smoker -- 0.50 packs/day    Types: Cigarettes  . Smokeless tobacco: Never Used  . Alcohol Use: 30.0 oz/week    50 Cans of beer per week     Comment: (1) 40 oz, (2) cans beer daily   . Drug Use: No  . Sexual Activity: No   Other Topics Concern  . None   Social History Narrative    Current outpatient prescriptions: aspirin 81 MG tablet, Take 81 mg by mouth daily. , Disp: , Rfl: ;  diltiazem (CARDIZEM CD) 300 MG 24 hr capsule, Take 300 mg by mouth daily.  , Disp: , Rfl: ;  levothyroxine (SYNTHROID, LEVOTHROID) 50 MCG tablet, TAKE 1 TABLET (50 MCG TOTAL) BY MOUTH  DAILY., Disp: 30 tablet, Rfl: 1;  metFORMIN (GLUCOPHAGE) 500 MG tablet, Take 500 mg by mouth 2 (two) times daily with a meal., Disp: , Rfl:  metoprolol (LOPRESSOR) 50 MG tablet, TAKE 1 AND 1/2 TABLETS BY MOUTH DAILY, Disp: 45 tablet, Rfl: 0;  Multiple Vitamin (MULTI-VITAMINS PO), Take by mouth., Disp: , Rfl: ;  PARoxetine (PAXIL) 20 MG tablet, Take 1 tablet (20 mg total) by mouth daily. PATIENT NEEDS OFFICE VISIT FOR ADDITIONAL REFILLS, Disp: 90 tablet, Rfl: 0;  polyethylene glycol (MIRALAX / GLYCOLAX) packet, Take 17 g by mouth daily.  , Disp: , Rfl:  pravastatin (PRAVACHOL) 20 MG tablet, TAKE 1 TABLET (20 MG TOTAL) BY MOUTH DAILY., Disp: 30 tablet, Rfl: 1;  ramipril (ALTACE) 10 MG capsule, TAKE 1 CAPSULE BY MOUTH TWICE A  DAY, Disp: 180 capsule, Rfl: 1;  [DISCONTINUED] flecainide (TAMBOCOR) 100 MG tablet, Take 100 mg by mouth 2 (two) times daily.  , Disp: , Rfl: ;  [DISCONTINUED] hydrochlorothiazide 25 MG tablet, Take 25 mg by mouth daily.  , Disp: , Rfl:   EXAM:  Filed Vitals:   08/18/14 1054  BP: 132/86  Pulse: 60  Temp: 97.7 F (36.5 C)    Body mass index is 19.35 kg/(m^2).  GENERAL: vitals reviewed and listed above, alert, oriented, appears well hydrated and in no acute distress  HEENT: atraumatic, conjunttiva clear, no obvious abnormalities on inspection of external nose and ears  NECK: no obvious masses on inspection  LUNGS: clear to auscultation bilaterally, no wheezes, rales or rhonchi, good air movement  CV: HRRR, no peripheral edema  MS: moves all extremities without noticeable abnormality  PSYCH: pleasant and cooperative, no obvious depression or anxiety  ASSESSMENT AND PLAN:  Discussed the following assessment and plan:  ABUSE, ALCOHOL, CONTINUOUS  Type 2 diabetes mellitus with complication  Hypothyroidism, unspecified hypothyroidism type - Plan: TSH  Hyperlipemia  Anxiety and depression  TOBACCO USE  Essential hypertension  Loss of weight - Plan: Ambulatory referral to Gastroenterology  Constipation, unspecified constipation type - Plan: Ambulatory referral to Gastroenterology  -BP good on recheck -check TSH -again stressed she needs to see psychiatrist, seek help for alcohol abuse - we have provided her information for help, return and hospital precuaitons -referral to GI for her weight loss and constipation -follow up as scheduled to transfer care -Patient advised to return or notify a doctor immediately if symptoms worsen or persist or new concerns arise.  Patient Instructions  BEFORE YOU LEAVE: -labs  -We placed a referral for you as discussed to the gastroenterologist for your weight loss and constipation. It usually takes about 1-2 weeks to process and  schedule this referral. If you have not heard from us regarding this appointment in 2 weeks please contact our office.  -Please call to set up appointment with psychiatrist for you alcohol abuse and depression. If worsening please seek care at the hospital. Please consider alcohol rehab program.   Please eat 3 healthy meals per day and use a fiber supplement such as metamucil daily.           Kriste BasqueKIM, Raequon Catanzaro R.

## 2014-08-18 NOTE — Patient Instructions (Addendum)
BEFORE YOU LEAVE: -labs  -We placed a referral for you as discussed to the gastroenterologist for your weight loss and constipation. It usually takes about 1-2 weeks to process and schedule this referral. If you have not heard from us regarding this appointment in 2 weeks please contact our office.  -Please call to set up appointment with psychiatrist for you alcohol abuse and depression. If worsening please seek care at the hospital. Please consider alcohol rehab program.   Please eat 3 healthy meals per day and use a fiber supplement such as metamucil daily.

## 2014-08-18 NOTE — Progress Notes (Signed)
Pre visit review using our clinic review tool, if applicable. No additional management support is needed unless otherwise documented below in the visit note. 

## 2014-08-20 ENCOUNTER — Other Ambulatory Visit: Payer: Self-pay | Admitting: *Deleted

## 2014-08-20 DIAGNOSIS — E038 Other specified hypothyroidism: Secondary | ICD-10-CM

## 2014-09-04 ENCOUNTER — Other Ambulatory Visit: Payer: Self-pay | Admitting: Family Medicine

## 2014-09-05 ENCOUNTER — Encounter (HOSPITAL_COMMUNITY): Payer: Self-pay

## 2014-09-05 ENCOUNTER — Inpatient Hospital Stay (HOSPITAL_COMMUNITY)
Admission: EM | Admit: 2014-09-05 | Discharge: 2014-09-11 | DRG: 246 | Disposition: A | Discharge status: Home Health | Payer: Medicare Other | Source: Other Acute Inpatient Hospital | Attending: Cardiology | Admitting: Cardiology

## 2014-09-05 ENCOUNTER — Inpatient Hospital Stay (HOSPITAL_COMMUNITY): Payer: Medicare Other

## 2014-09-05 ENCOUNTER — Encounter (HOSPITAL_COMMUNITY)
Admission: EM | Disposition: A | Discharge status: Home Health | Payer: Medicare Other | Source: Other Acute Inpatient Hospital | Attending: Cardiology

## 2014-09-05 ENCOUNTER — Emergency Department (HOSPITAL_COMMUNITY): Payer: Medicare Other

## 2014-09-05 DIAGNOSIS — I4819 Other persistent atrial fibrillation: Secondary | ICD-10-CM

## 2014-09-05 DIAGNOSIS — Z9114 Patient's other noncompliance with medication regimen: Secondary | ICD-10-CM | POA: Diagnosis not present

## 2014-09-05 DIAGNOSIS — E785 Hyperlipidemia, unspecified: Secondary | ICD-10-CM | POA: Diagnosis not present

## 2014-09-05 DIAGNOSIS — Z681 Body mass index (BMI) 19 or less, adult: Secondary | ICD-10-CM | POA: Diagnosis not present

## 2014-09-05 DIAGNOSIS — I25110 Atherosclerotic heart disease of native coronary artery with unstable angina pectoris: Secondary | ICD-10-CM | POA: Diagnosis not present

## 2014-09-05 DIAGNOSIS — I2582 Chronic total occlusion of coronary artery: Secondary | ICD-10-CM | POA: Diagnosis not present

## 2014-09-05 DIAGNOSIS — Z803 Family history of malignant neoplasm of breast: Secondary | ICD-10-CM

## 2014-09-05 DIAGNOSIS — Z45018 Encounter for adjustment and management of other part of cardiac pacemaker: Secondary | ICD-10-CM

## 2014-09-05 DIAGNOSIS — I214 Non-ST elevation (NSTEMI) myocardial infarction: Secondary | ICD-10-CM | POA: Diagnosis not present

## 2014-09-05 DIAGNOSIS — D62 Acute posthemorrhagic anemia: Secondary | ICD-10-CM | POA: Diagnosis not present

## 2014-09-05 DIAGNOSIS — I2119 ST elevation (STEMI) myocardial infarction involving other coronary artery of inferior wall: Secondary | ICD-10-CM | POA: Diagnosis not present

## 2014-09-05 DIAGNOSIS — I059 Rheumatic mitral valve disease, unspecified: Secondary | ICD-10-CM | POA: Diagnosis not present

## 2014-09-05 DIAGNOSIS — E43 Unspecified severe protein-calorie malnutrition: Secondary | ICD-10-CM | POA: Diagnosis present

## 2014-09-05 DIAGNOSIS — Z880 Allergy status to penicillin: Secondary | ICD-10-CM | POA: Diagnosis not present

## 2014-09-05 DIAGNOSIS — I213 ST elevation (STEMI) myocardial infarction of unspecified site: Secondary | ICD-10-CM

## 2014-09-05 DIAGNOSIS — R531 Weakness: Secondary | ICD-10-CM | POA: Diagnosis not present

## 2014-09-05 DIAGNOSIS — F329 Major depressive disorder, single episode, unspecified: Secondary | ICD-10-CM | POA: Diagnosis not present

## 2014-09-05 DIAGNOSIS — I2510 Atherosclerotic heart disease of native coronary artery without angina pectoris: Secondary | ICD-10-CM | POA: Diagnosis not present

## 2014-09-05 DIAGNOSIS — F32A Depression, unspecified: Secondary | ICD-10-CM | POA: Diagnosis present

## 2014-09-05 DIAGNOSIS — I2111 ST elevation (STEMI) myocardial infarction involving right coronary artery: Secondary | ICD-10-CM | POA: Diagnosis not present

## 2014-09-05 DIAGNOSIS — Z888 Allergy status to other drugs, medicaments and biological substances status: Secondary | ICD-10-CM | POA: Diagnosis not present

## 2014-09-05 DIAGNOSIS — I442 Atrioventricular block, complete: Secondary | ICD-10-CM | POA: Diagnosis present

## 2014-09-05 DIAGNOSIS — G4730 Sleep apnea, unspecified: Secondary | ICD-10-CM | POA: Diagnosis present

## 2014-09-05 DIAGNOSIS — Z7982 Long term (current) use of aspirin: Secondary | ICD-10-CM

## 2014-09-05 DIAGNOSIS — R404 Transient alteration of awareness: Secondary | ICD-10-CM | POA: Diagnosis not present

## 2014-09-05 DIAGNOSIS — E1169 Type 2 diabetes mellitus with other specified complication: Secondary | ICD-10-CM | POA: Diagnosis not present

## 2014-09-05 DIAGNOSIS — I959 Hypotension, unspecified: Secondary | ICD-10-CM | POA: Diagnosis present

## 2014-09-05 DIAGNOSIS — I481 Persistent atrial fibrillation: Secondary | ICD-10-CM | POA: Diagnosis not present

## 2014-09-05 DIAGNOSIS — Z951 Presence of aortocoronary bypass graft: Secondary | ICD-10-CM

## 2014-09-05 DIAGNOSIS — E118 Type 2 diabetes mellitus with unspecified complications: Secondary | ICD-10-CM

## 2014-09-05 DIAGNOSIS — I469 Cardiac arrest, cause unspecified: Secondary | ICD-10-CM

## 2014-09-05 DIAGNOSIS — Z720 Tobacco use: Secondary | ICD-10-CM | POA: Diagnosis not present

## 2014-09-05 DIAGNOSIS — R001 Bradycardia, unspecified: Secondary | ICD-10-CM | POA: Diagnosis not present

## 2014-09-05 DIAGNOSIS — F1010 Alcohol abuse, uncomplicated: Secondary | ICD-10-CM | POA: Diagnosis not present

## 2014-09-05 DIAGNOSIS — R1111 Vomiting without nausea: Secondary | ICD-10-CM

## 2014-09-05 DIAGNOSIS — E039 Hypothyroidism, unspecified: Secondary | ICD-10-CM | POA: Diagnosis present

## 2014-09-05 DIAGNOSIS — Z955 Presence of coronary angioplasty implant and graft: Secondary | ICD-10-CM

## 2014-09-05 DIAGNOSIS — I10 Essential (primary) hypertension: Secondary | ICD-10-CM | POA: Diagnosis not present

## 2014-09-05 DIAGNOSIS — I25119 Atherosclerotic heart disease of native coronary artery with unspecified angina pectoris: Secondary | ICD-10-CM

## 2014-09-05 DIAGNOSIS — F419 Anxiety disorder, unspecified: Secondary | ICD-10-CM | POA: Diagnosis not present

## 2014-09-05 DIAGNOSIS — I517 Cardiomegaly: Secondary | ICD-10-CM | POA: Diagnosis not present

## 2014-09-05 DIAGNOSIS — R0789 Other chest pain: Secondary | ICD-10-CM | POA: Diagnosis not present

## 2014-09-05 DIAGNOSIS — Z8679 Personal history of other diseases of the circulatory system: Secondary | ICD-10-CM

## 2014-09-05 DIAGNOSIS — J9811 Atelectasis: Secondary | ICD-10-CM | POA: Diagnosis not present

## 2014-09-05 HISTORY — PX: LEFT HEART CATHETERIZATION WITH CORONARY ANGIOGRAM: SHX5451

## 2014-09-05 HISTORY — DX: Paroxysmal atrial fibrillation: I48.0

## 2014-09-05 LAB — CBC
HCT: 35.8 % — ABNORMAL LOW (ref 36.0–46.0)
Hemoglobin: 12.1 g/dL (ref 12.0–15.0)
MCH: 28.4 pg (ref 26.0–34.0)
MCHC: 33.8 g/dL (ref 30.0–36.0)
MCV: 84 fL (ref 78.0–100.0)
Platelets: 194 10*3/uL (ref 150–400)
RBC: 4.26 MIL/uL (ref 3.87–5.11)
RDW: 13.8 % (ref 11.5–15.5)
WBC: 13.8 10*3/uL — ABNORMAL HIGH (ref 4.0–10.5)

## 2014-09-05 LAB — PROTIME-INR
INR: 7.45 (ref 0.00–1.49)
INR: 3.88 — ABNORMAL HIGH (ref 0.00–1.49)
Prothrombin Time: 63.9 seconds — ABNORMAL HIGH (ref 11.6–15.2)
Prothrombin Time: 38.3 seconds — ABNORMAL HIGH (ref 11.6–15.2)

## 2014-09-05 LAB — BASIC METABOLIC PANEL
ANION GAP: 11 (ref 5–15)
BUN: 56 mg/dL — ABNORMAL HIGH (ref 6–23)
CALCIUM: 7.8 mg/dL — AB (ref 8.4–10.5)
CO2: 21 mmol/L (ref 19–32)
CREATININE: 1.66 mg/dL — AB (ref 0.50–1.10)
Chloride: 95 mEq/L — ABNORMAL LOW (ref 96–112)
GFR calc non Af Amer: 29 mL/min — ABNORMAL LOW (ref >90)
GFR, EST AFRICAN AMERICAN: 34 mL/min — AB (ref >90)
Glucose, Bld: 186 mg/dL — ABNORMAL HIGH (ref 70–99)
Potassium: 3 mmol/L — ABNORMAL LOW (ref 3.5–5.1)
Sodium: 127 mmol/L — ABNORMAL LOW (ref 135–145)

## 2014-09-05 LAB — DIFFERENTIAL
BASOS PCT: 0 % (ref 0–1)
Basophils Absolute: 0 10*3/uL (ref 0.0–0.1)
EOS ABS: 0 10*3/uL (ref 0.0–0.7)
Eosinophils Relative: 0 % (ref 0–5)
LYMPHS ABS: 1.8 10*3/uL (ref 0.7–4.0)
Lymphocytes Relative: 13 % (ref 12–46)
Monocytes Absolute: 1.5 10*3/uL — ABNORMAL HIGH (ref 0.1–1.0)
Monocytes Relative: 11 % (ref 3–12)
Neutro Abs: 10.5 10*3/uL — ABNORMAL HIGH (ref 1.7–7.7)
Neutrophils Relative %: 76 % (ref 43–77)

## 2014-09-05 LAB — CK TOTAL AND CKMB (NOT AT ARMC)
CK, MB: 26.1 ng/mL — AB (ref 0.3–4.0)
Relative Index: 3.7 — ABNORMAL HIGH (ref 0.0–2.5)
Total CK: 711 U/L — ABNORMAL HIGH (ref 7–177)

## 2014-09-05 LAB — LIPID PANEL
Cholesterol: 175 mg/dL (ref 0–200)
HDL: 53 mg/dL (ref >39)
LDL Cholesterol: 106 mg/dL — ABNORMAL HIGH (ref 0–99)
Total CHOL/HDL Ratio: 3.3 RATIO
Triglycerides: 78 mg/dL (ref <150)
VLDL: 16 mg/dL (ref 0–40)

## 2014-09-05 LAB — MAGNESIUM: MAGNESIUM: 1.9 mg/dL (ref 1.5–2.5)

## 2014-09-05 LAB — APTT: aPTT: >200 seconds (ref 24–37)

## 2014-09-05 LAB — TROPONIN I: TROPONIN I: 68.12 ng/mL — AB (ref <0.031)

## 2014-09-05 LAB — MRSA PCR SCREENING: MRSA by PCR: NEGATIVE

## 2014-09-05 SURGERY — LEFT HEART CATHETERIZATION WITH CORONARY ANGIOGRAM
Anesthesia: LOCAL

## 2014-09-05 MED ORDER — POLYETHYLENE GLYCOL 3350 17 G PO PACK
17.0000 g | PACK | Freq: Every day | ORAL | Status: DC
  Administered 2014-09-06 – 2014-09-11 (×6): 17 g via ORAL
  Filled 2014-09-05 (×6): qty 1

## 2014-09-05 MED ORDER — HEPARIN SODIUM (PORCINE) 5000 UNIT/ML IJ SOLN
60.0000 [IU]/kg | Freq: Once | INTRAMUSCULAR | Status: AC
  Administered 2014-09-05: 4000 [IU] via INTRAVENOUS

## 2014-09-05 MED ORDER — PRAVASTATIN SODIUM 80 MG PO TABS
80.0000 mg | ORAL_TABLET | Freq: Every day | ORAL | Status: DC
  Administered 2014-09-05 – 2014-09-10 (×6): 80 mg via ORAL
  Filled 2014-09-05 (×7): qty 1

## 2014-09-05 MED ORDER — BIVALIRUDIN 250 MG IV SOLR
INTRAVENOUS | Status: AC
  Filled 2014-09-05: qty 250

## 2014-09-05 MED ORDER — SODIUM CHLORIDE 0.9 % IV SOLN
INTRAVENOUS | Status: DC
  Administered 2014-09-05: 18:00:00 via INTRAVENOUS

## 2014-09-05 MED ORDER — CLOPIDOGREL BISULFATE 300 MG PO TABS: 600.0000 mg | ORAL_TABLET | Freq: Once | ORAL | Status: DC

## 2014-09-05 MED ORDER — ONDANSETRON HCL 4 MG/2ML IJ SOLN: 4.0000 mg | Freq: Four times a day (QID) | INTRAMUSCULAR | Status: DC | PRN

## 2014-09-05 MED ORDER — ACETAMINOPHEN 325 MG PO TABS
650.0000 mg | ORAL_TABLET | ORAL | Status: DC | PRN
  Administered 2014-09-07 – 2014-09-08 (×3): 650 mg via ORAL
  Filled 2014-09-05: qty 2

## 2014-09-05 MED ORDER — ONDANSETRON HCL 4 MG/2ML IJ SOLN
4.0000 mg | Freq: Four times a day (QID) | INTRAMUSCULAR | Status: DC | PRN
  Administered 2014-09-05: 4 mg via INTRAVENOUS
  Filled 2014-09-05: qty 2

## 2014-09-05 MED ORDER — ALPRAZOLAM 0.25 MG PO TABS
0.2500 mg | ORAL_TABLET | Freq: Two times a day (BID) | ORAL | Status: DC | PRN
  Administered 2014-09-05 – 2014-09-07 (×2): 0.25 mg via ORAL
  Filled 2014-09-05 (×2): qty 1

## 2014-09-05 MED ORDER — PAROXETINE HCL 20 MG PO TABS
20.0000 mg | ORAL_TABLET | Freq: Every day | ORAL | Status: DC
  Administered 2014-09-06 – 2014-09-11 (×6): 20 mg via ORAL
  Filled 2014-09-05 (×6): qty 1

## 2014-09-05 MED ORDER — MIDAZOLAM HCL 2 MG/2ML IJ SOLN
INTRAMUSCULAR | Status: AC
  Filled 2014-09-05: qty 2

## 2014-09-05 MED ORDER — POTASSIUM CHLORIDE 10 MEQ/100ML IV SOLN
INTRAVENOUS | Status: AC
  Filled 2014-09-05: qty 100

## 2014-09-05 MED ORDER — CETYLPYRIDINIUM CHLORIDE 0.05 % MT LIQD
7.0000 mL | Freq: Two times a day (BID) | OROMUCOSAL | Status: DC
  Administered 2014-09-05 – 2014-09-11 (×11): 7 mL via OROMUCOSAL

## 2014-09-05 MED ORDER — ENOXAPARIN SODIUM 40 MG/0.4ML ~~LOC~~ SOLN
40.0000 mg | SUBCUTANEOUS | Status: DC
  Administered 2014-09-06: 40 mg via SUBCUTANEOUS
  Filled 2014-09-05 (×3): qty 0.4

## 2014-09-05 MED ORDER — TICAGRELOR 90 MG PO TABS
90.0000 mg | ORAL_TABLET | Freq: Two times a day (BID) | ORAL | Status: DC
  Administered 2014-09-05: 90 mg via ORAL
  Filled 2014-09-05: qty 1

## 2014-09-05 MED ORDER — ASPIRIN 81 MG PO CHEW
324.0000 mg | CHEWABLE_TABLET | Freq: Once | ORAL | Status: AC
  Administered 2014-09-05: 324 mg via ORAL

## 2014-09-05 MED ORDER — SODIUM CHLORIDE 0.9 % IV SOLN
1.7500 mg/kg/h | INTRAVENOUS | Status: AC
  Filled 2014-09-05: qty 250

## 2014-09-05 MED ORDER — ASPIRIN EC 81 MG PO TBEC: 81.0000 mg | DELAYED_RELEASE_TABLET | Freq: Every day | ORAL | Status: DC

## 2014-09-05 MED ORDER — RAMIPRIL 5 MG PO CAPS
5.0000 mg | ORAL_CAPSULE | Freq: Two times a day (BID) | ORAL | Status: DC
  Administered 2014-09-05: 5 mg via ORAL
  Filled 2014-09-05 (×3): qty 1

## 2014-09-05 MED ORDER — NITROGLYCERIN 0.4 MG SL SUBL: 0.4000 mg | SUBLINGUAL_TABLET | SUBLINGUAL | Status: DC | PRN

## 2014-09-05 MED ORDER — INSULIN ASPART 100 UNIT/ML ~~LOC~~ SOLN
0.0000 [IU] | Freq: Three times a day (TID) | SUBCUTANEOUS | Status: DC
  Administered 2014-09-06 (×2): 2 [IU] via SUBCUTANEOUS
  Administered 2014-09-07: 3 [IU] via SUBCUTANEOUS
  Administered 2014-09-07: 2 [IU] via SUBCUTANEOUS
  Administered 2014-09-07 – 2014-09-08 (×2): 5 [IU] via SUBCUTANEOUS
  Administered 2014-09-08 (×2): 2 [IU] via SUBCUTANEOUS
  Administered 2014-09-09 – 2014-09-10 (×2): 3 [IU] via SUBCUTANEOUS
  Administered 2014-09-10 – 2014-09-11 (×3): 2 [IU] via SUBCUTANEOUS

## 2014-09-05 MED ORDER — HYDRALAZINE HCL 20 MG/ML IJ SOLN
10.0000 mg | Freq: Once | INTRAMUSCULAR | Status: AC
  Administered 2014-09-05: 10 mg via INTRAVENOUS

## 2014-09-05 MED ORDER — LORAZEPAM 2 MG/ML IJ SOLN
INTRAMUSCULAR | Status: AC
  Filled 2014-09-05: qty 1

## 2014-09-05 MED ORDER — LEVOTHYROXINE SODIUM 50 MCG PO TABS
50.0000 ug | ORAL_TABLET | Freq: Every day | ORAL | Status: DC
Start: 2014-09-06 — End: 2014-09-11
  Administered 2014-09-06 – 2014-09-11 (×6): 50 ug via ORAL
  Filled 2014-09-05 (×9): qty 1

## 2014-09-05 MED ORDER — TICAGRELOR 90 MG PO TABS
ORAL_TABLET | ORAL | Status: AC
  Filled 2014-09-05: qty 2

## 2014-09-05 MED ORDER — INSULIN ASPART 100 UNIT/ML ~~LOC~~ SOLN
0.0000 [IU] | Freq: Every day | SUBCUTANEOUS | Status: DC
  Administered 2014-09-05 – 2014-09-07 (×2): 2 [IU] via SUBCUTANEOUS

## 2014-09-05 MED ORDER — POTASSIUM CHLORIDE 10 MEQ/100ML IV SOLN
10.0000 meq | INTRAVENOUS | Status: AC
  Administered 2014-09-05 (×2): 10 meq via INTRAVENOUS
  Filled 2014-09-05: qty 100

## 2014-09-05 MED ORDER — CLOPIDOGREL BISULFATE 75 MG PO TABS: 75.0000 mg | ORAL_TABLET | Freq: Every day | ORAL | Status: DC

## 2014-09-05 MED ORDER — ASPIRIN EC 81 MG PO TBEC
81.0000 mg | DELAYED_RELEASE_TABLET | Freq: Every day | ORAL | Status: DC
  Administered 2014-09-05 – 2014-09-11 (×7): 81 mg via ORAL
  Filled 2014-09-05 (×7): qty 1

## 2014-09-05 MED ORDER — ASPIRIN 81 MG PO TABS: 81.0000 mg | ORAL_TABLET | Freq: Every day | ORAL | Status: DC

## 2014-09-05 MED ORDER — HYDRALAZINE HCL 20 MG/ML IJ SOLN
INTRAMUSCULAR | Status: AC
  Filled 2014-09-05: qty 1

## 2014-09-05 MED ORDER — LIDOCAINE HCL (PF) 1 % IJ SOLN
INTRAMUSCULAR | Status: AC
  Filled 2014-09-05: qty 30

## 2014-09-05 MED ORDER — RAMIPRIL 10 MG PO CAPS: 10.0000 mg | ORAL_CAPSULE | Freq: Two times a day (BID) | ORAL | Status: DC

## 2014-09-05 MED ORDER — SODIUM CHLORIDE 0.9 % IV SOLN: INTRAVENOUS | Status: DC

## 2014-09-05 MED ORDER — HEPARIN (PORCINE) IN NACL 2-0.9 UNIT/ML-% IJ SOLN
INTRAMUSCULAR | Status: AC
  Filled 2014-09-05: qty 1500

## 2014-09-05 MED ORDER — FENTANYL CITRATE 0.05 MG/ML IJ SOLN
INTRAMUSCULAR | Status: AC
  Filled 2014-09-05: qty 2

## 2014-09-05 MED ORDER — PRAVASTATIN SODIUM 20 MG PO TABS: 20.0000 mg | ORAL_TABLET | Freq: Every day | ORAL | Status: DC

## 2014-09-05 MED ORDER — NITROGLYCERIN 1 MG/10 ML FOR IR/CATH LAB
INTRA_ARTERIAL | Status: AC
  Filled 2014-09-05: qty 10

## 2014-09-05 MED ORDER — PRAVASTATIN SODIUM 80 MG PO TABS: 80.0000 mg | ORAL_TABLET | Freq: Every day | ORAL | Status: DC

## 2014-09-05 MED ORDER — ACETAMINOPHEN 325 MG PO TABS: 650.0000 mg | ORAL_TABLET | ORAL | Status: DC | PRN

## 2014-09-05 MED ORDER — ACETAMINOPHEN 325 MG PO TABS
650.0000 mg | ORAL_TABLET | ORAL | Status: DC | PRN
  Filled 2014-09-05 (×2): qty 2

## 2014-09-05 MED ORDER — LORAZEPAM 2 MG/ML IJ SOLN
0.5000 mg | Freq: Once | INTRAMUSCULAR | Status: AC
  Administered 2014-09-05: 0.5 mg via INTRAVENOUS

## 2014-09-05 MED ORDER — ASPIRIN 81 MG PO CHEW
CHEWABLE_TABLET | ORAL | Status: AC
  Filled 2014-09-05: qty 4

## 2014-09-05 MED ORDER — ZOLPIDEM TARTRATE 5 MG PO TABS: 5.0000 mg | ORAL_TABLET | Freq: Every evening | ORAL | Status: DC | PRN

## 2014-09-05 NOTE — Progress Notes (Signed)
Briefly made contact with patient to check about family.  Patient gave me daughter Angela Adam phone number [phone]).  Called Joni Reining who had been the one to call EMS. She stated she would be coming to the hospital in approximately one hour.  Please page me when she arrives.    Debby Bud [place], [place] [phone]

## 2014-09-05 NOTE — ED Notes (Signed)
Per EMS; pt from with nv x 3 days.  Reports some chest discomfort.  STEMI called in the field.  No ASA or nitro given due to BP 83/57. 2 PIVs established, 18 LFA and 20 Lhand

## 2014-09-05 NOTE — H&P (Signed)
History and Physical   Patient ID: GWENDALYN MCGONAGLE MRN: [id number], DOB/AGE: 1941/08/08 74 y.o. Date of Encounter: 09/05/2014  Primary Physician: Terressa Koyanagi., DO Primary Cardiologist: Dr. Antoine Poche  Chief Complaint:  Inferior STEMI  HPI: BRYTTNEY NETZER is a 74 y.o. female with a history of nonobstructive CAD by cath in 2006 and PAF, refusing Coumadin.  She feeling a bit poorly recently, describing a reluctance to eat and stating she had been losing weight. She was seen by Dr. Selena Batten on 12/15 and described as an alcoholic who is poorly compliant. She was there to check her blood pressure but having no other issues. Compliance with medications and avoidance of alcohol was encouraged.  2 days ago, she had onset of nausea and vomiting. After the nausea and vomiting, she had chest pain. She is unable to quantify or describe the pain further. When her symptoms did not resolve, she came to the emergency room. In the emergency room, her ECG was consistent with an inferior STEMI. She was treated with aspirin 325 mg, heparin 60 units per kilogram, and transported directly to the Cath Lab. Her initial ECG was sinus bradycardia in the 40s. Previously her heart rate had been in the 50s. In the cath lab for ECG is still significantly abnormal and she has become more bradycardic, with a heart rate in the 30s. Emergent cardiac catheterization is indicated.  Past Medical History  Diagnosis Date  . PAF (paroxysmal atrial fibrillation)   . COPD (chronic obstructive pulmonary disease)   . Hyperlipidemia   . Hypothyroidism   . Arthritis   . Diabetes mellitus   . History of EMG 11/00    negative    Surgical History:  Past Surgical History  Procedure Laterality Date  . Ankle fracture surgery      left  . Thyroid colloid cyst   12/00  . Cardiac catheterization  06/16/05    Lmain 20%, LAD 20/40%, D1 & D2 30%, CFX 30%, RCA 50%, EF 75%      I have reviewed the patient's current medications. Prior  to Admission medications   Medication Sig Start Date End Date Taking? Authorizing Provider  aspirin 81 MG tablet Take 81 mg by mouth daily.     Historical Provider, MD  diltiazem (CARDIZEM CD) 300 MG 24 hr capsule Take 300 mg by mouth daily.      Historical Provider, MD  levothyroxine (SYNTHROID, LEVOTHROID) 50 MCG tablet TAKE 1 TABLET (50 MCG TOTAL) BY MOUTH DAILY. 08/18/14   Terressa Koyanagi, DO  metFORMIN (GLUCOPHAGE) 500 MG tablet Take 500 mg by mouth 2 (two) times daily with a meal.    Historical Provider, MD  metoprolol (LOPRESSOR) 50 MG tablet TAKE 1 AND 1/2 TABLETS BY MOUTH DAILY 08/17/14   Terressa Koyanagi, DO  Multiple Vitamin (MULTI-VITAMINS PO) Take by mouth.    Historical Provider, MD  PARoxetine (PAXIL) 20 MG tablet Take 1 tablet (20 mg total) by mouth daily. PATIENT NEEDS OFFICE VISIT FOR ADDITIONAL REFILLS    Chelle S Jeffery, PA-C  polyethylene glycol (MIRALAX / GLYCOLAX) packet Take 17 g by mouth daily.      Historical Provider, MD  pravastatin (PRAVACHOL) 20 MG tablet TAKE 1 TABLET (20 MG TOTAL) BY MOUTH DAILY. 06/29/14   Terressa Koyanagi, DO  ramipril (ALTACE) 10 MG capsule TAKE 1 CAPSULE BY MOUTH TWICE A DAY 04/01/14   Lindley Magnus, MD    Allergies:  Allergies  Allergen Reactions  .  Atorvastatin Other (See Comments)    Muscle aches  . Hydrochlorothiazide W-Triamterene     REACTION: palpitations  . Penicillins     REACTION: urticaria (hives)  . Statins     Subjective myalgia    History   Social History  . Marital Status: Married    Spouse Name: N/A    Number of Children: N/A  . Years of Education: N/A   Occupational History  . Not on file.   Social History Main Topics  . Smoking status: Current Every Day Smoker -- 0.50 packs/day    Types: Cigarettes  . Smokeless tobacco: Never Used  . Alcohol Use: 30.0 oz/week    50 Cans of beer per week     Comment: (1) 40 oz, (2) cans beer daily   . Drug Use: No  . Sexual Activity: No   Other Topics Concern  . Not on file     Social History Narrative    Family History  Problem Relation Age of Onset  . Cancer Mother     breast   Family Status  Relation Status Death Age  . Mother Deceased 44  . Father Deceased 65    murdered    Review of Systems:   Full 14-point review of systems otherwise negative except as noted above.  Physical Exam: Blood pressure 106/47, pulse 47, temperature 97.5 F (36.4 C), resp. rate 22, height  (1.626 m), weight 112 lb 14 oz (51.2 kg), SpO2 98 %. General: Well developed, thin,female in moderate distress. Head: Normocephalic, atraumatic, sclera non-icteric, no xanthomas, nares are without discharge. Dentition: Poor Neck: No carotid bruits. JVD not elevated. No thyromegally Lungs: Good expansion bilaterally. without wheezes or rhonchi. Few rales bases Heart: Slow but Regular rate and rhythm with S1 S2.  No S3 or S4.  2/6 murmur, no rubs, or gallops appreciated. Abdomen: Soft, non-tender, non-distended with normoactive bowel sounds. No hepatomegaly. No rebound/guarding. No obvious abdominal masses. Msk:  Strength and tone appear normal for age. No joint deformities or effusions, no spine or costo-vertebral angle tenderness. Extremities: No clubbing or cyanosis. No edema.  Distal pedal pulses are 2+ in 4 extrem Neuro: Alert and oriented X 3. Moves all extremities spontaneously. No focal deficits noted. Psych:  Responds to questions appropriately with a normal affect. Skin: No rashes or lesions noted  Labs:  pending   Cardiac Cath: 06/16/2005 ANGIOGRAPHIC FINDINGS: 1. The left main coronary artery has minor luminal irregularities and is  moderate in size with approximately 20% midvessel stenosis. This vessel  gives rise to the left anterior descending and circumflex coronary  arteries. 2. The left anterior descending is small to medium in caliber and diffusely  diseased to approximately the 20% of the proximal segment and 40% in the  mid  segment with other luminal irregularities noted. There are two  medium caliber proximal and mid diagonal branches both of which have  mild disease of approximately 20-30%. 3. The circumflex coronary artery is a medium caliber vessel with two  obtuse marginal branches. Minor luminal irregularities are noted with  approximately 30% stenosis in the midcircumflex. 4. The right coronary arteEF 75%ry is a medium to large in caliber and dominant  with to large right ventricular marginal branches. There is a fairly  focal 50% stenosis in the mid right coronary artery and other minor  luminal irregularities. Left ventriculography is was performed in the RAO projection. It revealed an ejection fraction of approximately 75% in the setting of ventricular ectopy with  no focal wall motion abnormalities and trace mitral regurgitation. DIAGNOSES: 1. Nonobstructive coronary atherosclerosis as outlined above including a  50% mid-right coronary stenosis and a 40% mid left anterior descending  stenosis with other mild scattered disease in diffuse pattern. 2. Left ventricular ejection fraction approximately 75% with elevated left  end diastolic pressure of 28 mmHg and trace mitral regurgitation in the  setting of ventricular ectopy.  Echo: 2006 Ef 55%  ECG: Sinus bradycardia, rate 40s, significant inferior ST elevation  ASSESSMENT AND PLAN:  Principal Problem:   ST elevation myocardial infarction (STEMI) of inferior wall - emergent cardiac catheterization is indicated with further evaluation and treatment depending on the results. She will be continued on home medications with the exception of metformin, and will be screened for cardiac risk factors and a control. Because of the bradycardia, she will not be on a beta blocker. Her home diltiazem and metoprolol doses will be held. She will be on the CIWA protocol for possible alcohol withdrawal. She will  not be on high-dose statin because of an intolerance, but has been on Pravachol and we will continue this.  Otherwise, continue current medications his heart rate and blood pressure will allow Active Problems:   Hypothyroidism   Hyperlipemia   Anxiety and depression   Essential hypertension   ATRIAL FIBRILLATION, PAROXYSMAL   EtOH abuse   Tobacco use    Signed, Theodore Demark, PA-C 09/05/2014 4:48 PM Beeper [phone]   Patient seen and examined. Agree with assessment and plan.  Ms Laubach is a 74 year old African-American female who was transported to [facility] via [facility].  In the field a code STEMI was called.  Her ECG revealed possible underlying atrial fibrillation/flutter with ventricular rate at 42 bpm.  She had significant inferior lateral Q wave development with evolving T-wave inversion in early transition suggestive of a involving large inferior lateral myocardial infarction.  In retrospect, the patient has been experiencing chest pain for several days.  She was taken acutely to the catheterization laboratory.  Upon arrival to the catheterization laboratory her heart rate dropped 36 bpm.  Plan emergent catheterization, and  pacemaker insertion.   Lennette Bihari, MD, [facility] 09/05/2014 6:03 PM

## 2014-09-05 NOTE — CV Procedure (Signed)
[name] is a 74 y.o. female    [id number]  [id number] LOCATION:  FACILITY: [facility]  PHYSICIAN: Troy Sine, MD, [facility] [birth year]   DATE OF PROCEDURE:  09/05/2014    EMERGENT CARDIAC CATHETERIZATION/TEMPORARY PACEMAKER/ PERCUTANEOUS CORONARY INTERVENTION     HISTORY:    [name] is a 74 y.o. female was transported by [facility] in the setting of a inferior ST segment elevation myocardial infarction.  Her ECG upon arrival shows deep inferior Q waves with evolving T-wave inversion inferolaterally with bradycardia and probable underlying atrial fib/flutter.  She is taken acutely to the cardiac catheterization laboratory.   PROCEDURE: Temporary pacemaker insertion, Left heart catheterization: coronary angiography, left ventriculography; percutaneous coronary intervention with PTCA/DES stenting of a totally occluded mid RCA and PTCA of the posterolateral branch.  The patient was brought to the [facility] cardiac catherization laboratory after she was brought to the emergency room by [facility].  She was prepped and draped in sterile fashion.  Her right femoral artery and femoral vein puncture and anteriorly and 6 French sheaths were inserted without difficulty.  With her heart rate at 36 bpm.  Transvenous, temp rate pacemaker was advanced via the femoral sheath to the right ventricular apex.  Capture was excellent and her rate was set at 60, with an MA of 5.  She was given Versed 1 mg and fentanyl 25 g.  Diagnostic catheterization was done with 5 [place] Judkins for left and right coronary catheters.  With the demonstration of total mid occlusion with TIMI 0 flow down the mid RCA, accounting for her Inferior STEMI percutaneous coronary intervention was performed.  Angiomax bolus plus infusion was administered.  ACT was documented to be therapeutic.  180 mg of Brilinta was administered at the end of the procedure Since the patient was sleeping and snoring loudly.  Of note, she  appears to have sleep apnea since there were periods of witnessed apnea when she was sedated.  A 6 [place] FR4 guide was used for the intervention.  An aside, he medium wire was successfully able to cross the total occlusion and was advanced into the distal RCA.  Initial dilatation was done with a 2.012 mm balloon at 8, 10, and 11 atm.  This resulted in restoration of TIMI-3 flow.  However, it.  There was evidence for subtotal diffuse 95-99% stenosis in the proximal portion of the PLA vessel.  The wire was then readvanced and was able to cross this high-grade PLA stenosis.  The 2.0 balloon was then inserted and advanced into the PLA and low-level dilatations were made significant improvement in the stenosis.  The 20 balloon was then removed.  A 3.518 mm resolute DES stent was then inserted in the mid RCA at the point of initial total occlusion.  The stent was deployed at 13 and 14 atm.  A noncompliant emerge 3.7515 mm balloon was used for post stent dilatation up to 3.75 mm.  Patient received several doses of IC nitroglycerin during the procedure.  Angiography confirmed an excellent angiographic result at the acute infarct site.  With the 100% occlusion being reduced to 0% and in the PLA vessel at the site of PTCA to 99% stenosis was reduced to 20%.  There was pristine TIMI-3 flow in the distal RCA was large.  A 5 French pigtail catheter was then inserted and left ventriculography was performed.  This time, the pacemaker rate was reduced and at a heart rate of 40.  The patient was still  pacing.  The pacemaker was sutured in place as was the arterial sheath.  The patient received Brilinta when she had awakened.  She left the catheterization laboratory chest pain-free with stable hemodynamics with plans to continue Angiomax for 4 hours post PCI.  HEMODYNAMICS:   Central Aorta: 100/55   Left Ventricle: 100/20  ANGIOGRAPHY:  Left main: Large caliber vessel which bifurcated into the LAD and left circumflex  coronary artery.  LAD: Moderate size vessel that had tandem 70% very proximal stenoses before the takeoff of the first diagonal vessel.  The first diagonal branch had mid distal 60% narrowing.  There was 50% stenosis in the LAD between the first and second diagonal vessel.  The second diagonal vessel had 90% stenosis focally in its midsegment.  The LAD extended to the LV apex and gave rise to several septal perforating arteries.  Left circumflex: Moderate size vessel that gave rise to a prominent first marginal branch which bifurcated.  It was 70% AV groove stenosis after this marginal branch.  Not been at the site of where the second marginal branch had arisen.  This was totally occluded and appeared old.  The AV groove circumflex extended distally and at the very distal aspect appeared totally occluded.  There was there were faint collaterals supplying all branches of the distal RCA.  Right coronary artery: Large dominant vessel that was totally occluded at its midsegment after the anterior RV marginal branch.  There was TIMI 0 flow initially.  Following initial opening of the vessel.  There also was diffuse 95-99% proximal PLA stenosis and very distal 70% PLA stenosis and a small caliber vessel.  Distal RCA also supplied a large PDA branch and inferior LV branch which were free of significant disease.  Following percutaneous coronary intervention.  The mid RCA site, treated with PTCA, and DES stenting with a 3.518 mm resolute integrity stent postdilated to 3.75 mm was reduced from 100% to 0% and there was resumption of brisk TIMI-3 flow.  There was no evidence for dissection.  The proximal PLA vessel which was diffusely diseased at 95-99% was successfully treated with PTCA, the lesions were reduced to 20%.   Left ventriculography revealed an ejection fraction approximately 45%.  There was vigorous contractility of the anterior wall and apex.  There was moderate diffuse hypocontractility of a large  portion of the inferior wall.  There appeared to be 1-2+ angiographic mitral regurgitation.   IMPRESSION:  Acute ST segment elevation inferior lateral myocardial infarction in this patient with several days of chest pain and on presentation had extensive inferior lateral Q waves with evolving T-wave inversion secondary to total mid RCA occlusion and a dominant right coronary artery.  Significant multivessel CAD with the very proximal 70% LAD stenoses, 60% mid distal first diagonal stenosis, 50% stenosis of the LAD between the first and second diagonal branch, 90% second diagonal stenosis; left circumflex 70% stenosis after the takeoff of the first marginal branch with total occlusion of the OM 2 vessel and very distal AV groove circumflex occlusion; and total acute occlusion of the mid RCA with initial TIMI 0 flow.  Profound bradycardia to a heart rate of 36 with probable underlying atrial fib/flutter treated with emergent temporary pacemaker insertion.  Successful PTCA/stenting of the acute infarct site in the mid RCA with the 100% occlusion being reduced to 0% with insertion of a 3.518 mm resolute DES stent postdilated to 3.75 mm and PTCA of the proximal PLA vessel with 95%.  Diffuse stenoses being  reduced to 20%.  RECOMMENDATION:  The patient was hydrated vigorously during the catheterization due to potential high likelihood of RV infarction.  She has significant multivessel CAD involving her LAD and circumflex vessel and underwent successful treatment of her acute infarct vessel with DES stenting at the occlusion site and PTCA of her PLA vessel.  She will be continued on Angiomax for 4 hours.  An echo Doppler study will be done to reassess her mitral regurgitation.  She may ultimately require consideration for additional intervention to her proximal LAD and proximal AV groove circumflex immediately after the first marginal branch.    Troy Sine, MD, [facility] 09/05/2014 6:19 PM

## 2014-09-05 NOTE — ED Notes (Signed)
Cath lab zoll pads placed on pt.  Pt undressed and placed in gown. Jewelry (3 gold rings and earrings) placed in pt belongings bag with clothes (black shirt, grey sweatpants, black socks, and brown underwear).

## 2014-09-06 DIAGNOSIS — I442 Atrioventricular block, complete: Secondary | ICD-10-CM

## 2014-09-06 DIAGNOSIS — I481 Persistent atrial fibrillation: Secondary | ICD-10-CM

## 2014-09-06 DIAGNOSIS — I2119 ST elevation (STEMI) myocardial infarction involving other coronary artery of inferior wall: Principal | ICD-10-CM

## 2014-09-06 LAB — GLUCOSE, CAPILLARY
GLUCOSE-CAPILLARY: 106 mg/dL — AB (ref 70–99)
GLUCOSE-CAPILLARY: 166 mg/dL — AB (ref 70–99)
Glucose-Capillary: 205 mg/dL — ABNORMAL HIGH (ref 70–99)
Glucose-Capillary: 137 mg/dL — ABNORMAL HIGH (ref 70–99)
Glucose-Capillary: 135 mg/dL — ABNORMAL HIGH (ref 70–99)

## 2014-09-06 LAB — CBC
HEMATOCRIT: 30.3 % — AB (ref 36.0–46.0)
HEMOGLOBIN: 10.4 g/dL — AB (ref 12.0–15.0)
MCH: 29.6 pg (ref 26.0–34.0)
MCHC: 34.3 g/dL (ref 30.0–36.0)
MCV: 86.3 fL (ref 78.0–100.0)
Platelets: 171 10*3/uL (ref 150–400)
RBC: 3.51 MIL/uL — ABNORMAL LOW (ref 3.87–5.11)
RDW: 14.1 % (ref 11.5–15.5)
WBC: 15 10*3/uL — ABNORMAL HIGH (ref 4.0–10.5)

## 2014-09-06 LAB — COMPREHENSIVE METABOLIC PANEL
ALBUMIN: 2.3 g/dL — AB (ref 3.5–5.2)
ALK PHOS: 40 U/L (ref 39–117)
ALT: 46 U/L — ABNORMAL HIGH (ref 0–35)
ANION GAP: 4 — AB (ref 5–15)
AST: 91 U/L — ABNORMAL HIGH (ref 0–37)
BUN: 51 mg/dL — ABNORMAL HIGH (ref 6–23)
CO2: 21 mmol/L (ref 19–32)
Calcium: 7.1 mg/dL — ABNORMAL LOW (ref 8.4–10.5)
Chloride: 109 mEq/L (ref 96–112)
Creatinine, Ser: 1.16 mg/dL — ABNORMAL HIGH (ref 0.50–1.10)
GFR calc non Af Amer: 45 mL/min — ABNORMAL LOW (ref >90)
GFR, EST AFRICAN AMERICAN: 52 mL/min — AB (ref >90)
Glucose, Bld: 167 mg/dL — ABNORMAL HIGH (ref 70–99)
POTASSIUM: 3.6 mmol/L (ref 3.5–5.1)
Sodium: 134 mmol/L — ABNORMAL LOW (ref 135–145)
Total Bilirubin: 0.8 mg/dL (ref 0.3–1.2)
Total Protein: 4.3 g/dL — ABNORMAL LOW (ref 6.0–8.3)

## 2014-09-06 LAB — LIPID PANEL
CHOL/HDL RATIO: 3.6 ratio
CHOLESTEROL: 192 mg/dL (ref 0–200)
HDL: 53 mg/dL (ref >39)
LDL Cholesterol: 110 mg/dL — ABNORMAL HIGH (ref 0–99)
Triglycerides: 144 mg/dL (ref <150)
VLDL: 29 mg/dL (ref 0–40)

## 2014-09-06 LAB — TROPONIN I: TROPONIN I: 66.5 ng/mL — AB (ref <0.031)

## 2014-09-06 LAB — HEMOGLOBIN A1C
Hgb A1c MFr Bld: 7.3 % — ABNORMAL HIGH (ref <5.7)
Mean Plasma Glucose: 163 mg/dL — ABNORMAL HIGH (ref <117)

## 2014-09-06 LAB — TSH: TSH: 0.845 u[IU]/mL (ref 0.350–4.500)

## 2014-09-06 LAB — POCT ACTIVATED CLOTTING TIME
Activated Clotting Time: 189 seconds
Activated Clotting Time: 159 seconds

## 2014-09-06 MED ORDER — MIDAZOLAM HCL 2 MG/2ML IJ SOLN: 2.0000 mg | Freq: Once | INTRAMUSCULAR | Status: DC

## 2014-09-06 MED ORDER — MIDAZOLAM HCL 2 MG/2ML IJ SOLN
INTRAMUSCULAR | Status: AC
  Filled 2014-09-06: qty 2

## 2014-09-06 MED ORDER — ENOXAPARIN SODIUM 120 MG/0.8ML ~~LOC~~ SOLN
110.0000 mg | Freq: Two times a day (BID) | SUBCUTANEOUS | Status: DC
  Filled 2014-09-06 (×2): qty 0.8

## 2014-09-06 MED ORDER — SODIUM CHLORIDE 0.9 % IV SOLN
INTRAVENOUS | Status: AC
  Administered 2014-09-06 – 2014-09-07 (×2): via INTRAVENOUS

## 2014-09-06 MED ORDER — DOPAMINE-DEXTROSE 3.2-5 MG/ML-% IV SOLN
0.0000 ug/kg/min | INTRAVENOUS | Status: DC
  Administered 2014-09-06: 2.5 ug/kg/min via INTRAVENOUS
  Filled 2014-09-06: qty 250

## 2014-09-06 MED ORDER — ENOXAPARIN SODIUM 120 MG/0.8ML ~~LOC~~ SOLN
110.0000 mg | Freq: Two times a day (BID) | SUBCUTANEOUS | Status: DC
  Administered 2014-09-06: 110 mg via SUBCUTANEOUS
  Filled 2014-09-06 (×4): qty 0.8

## 2014-09-06 MED ORDER — TICAGRELOR 90 MG PO TABS
90.0000 mg | ORAL_TABLET | Freq: Two times a day (BID) | ORAL | Status: DC
  Administered 2014-09-06 – 2014-09-08 (×4): 90 mg via ORAL
  Filled 2014-09-06 (×7): qty 1

## 2014-09-06 MED ORDER — SODIUM CHLORIDE 0.9 % IV SOLN: INTRAVENOUS | Status: DC

## 2014-09-06 MED ORDER — ENSURE COMPLETE PO LIQD
237.0000 mL | Freq: Two times a day (BID) | ORAL | Status: DC
  Administered 2014-09-07: 237 mL via ORAL

## 2014-09-06 NOTE — Progress Notes (Signed)
Upon assessment of the pt I noticed groin site was bleeding. Began holding pressure at 1910 and continued to hold pressure until 2100. Paged MD to make him aware and he placed orders for a Fem Stop to be placed. We applied a Fem Stop on the patient's groin. I kept the fem stop on for 30 min and noticed that the groin was still bleeding so I applied manual pressure again until 2230. I again tried the fem stop at 2230 for 30 min and checked an ACT which was 159. I spoke with MD about pulling the sheath. Pt SBP was in the 150s so MD ordered 0.5 mg IV ativan and 10 mg IV hydralazine. Once the BP came down I was able to pull the sheath.  MD came to bedside to check on pt. Will continue to assess and monitor the pt closely.

## 2014-09-06 NOTE — Progress Notes (Signed)
ANTICOAGULATION CONSULT NOTE - Initial Consult  Pharmacy Consult for Enoxaparin Indication: atrial fibrillation  Allergies  Allergen Reactions  . Atorvastatin Other (See Comments)    Muscle aches  . Hydrochlorothiazide W-Triamterene     REACTION: palpitations  . Penicillins     REACTION: urticaria (hives)  . Statins     Subjective myalgia    Patient Measurements: Height:  (160 cm) Weight: 245 lb 2.4 oz (111.2 kg) IBW/kg (Calculated) : 52.4 Heparin Dosing Weight: n/a  Vital Signs: Temp: 97.7 F (36.5 C) (01/03 0800) Temp Source: Oral (01/03 0800) BP: 86/50 mmHg (01/03 0900) Pulse Rate: 59 (01/03 0900)  Labs:  Recent Labs  09/05/14 1735 09/05/14 1952 09/06/14 0005 09/06/14 0605  HGB 12.1  --   --  10.4*  HCT 35.8*  --   --  30.3*  PLT 194  --   --  171  APTT >200*  --   --   --   LABPROT 63.9* 38.3*  --   --   INR 7.45* 3.88*  --   --   CREATININE 1.66*  --   --  1.16*  CKTOTAL 711*  --   --   --   CKMB 26.1*  --   --   --   TROPONINI 68.12*  --  >80.00* 66.50*    Estimated Creatinine Clearance: 50.2 mL/min (by C-G formula based on Cr of 1.16).   Medical History: Past Medical History  Diagnosis Date  . PAF (paroxysmal atrial fibrillation)   . COPD (chronic obstructive pulmonary disease)   . Hyperlipidemia   . Hypothyroidism   . Arthritis   . Diabetes mellitus   . History of EMG 11/00    negative    Medications:  Scheduled:  . antiseptic oral rinse  7 mL Mouth Rinse BID  . aspirin EC  81 mg Oral Daily  . insulin aspart  0-15 Units Subcutaneous TID WC  . insulin aspart  0-5 Units Subcutaneous QHS  . levothyroxine  50 mcg Oral QAC breakfast  . midazolam  2 mg Intravenous Once  . PARoxetine  20 mg Oral Daily  . polyethylene glycol  17 g Oral Daily  . pravastatin  80 mg Oral q1800    Assessment: 74 yo female admitted as inferior STEMI (s/p stenting), hx non-obstructive CAD.  Hx PAF but refusing Coumadin in the past.  Known hx EtOH abuse.   Pharmacy asked to begin full-dose Lovenox.  CBC slightly low at baseline.  Goal of Therapy:  Anti-Xa level 0.6-1 units/ml 4hrs after LMWH dose given Monitor platelets by anticoagulation protocol: Yes   Plan:  1. Lovenox 110 mg sq q 12 hrs. 2. CBC q 72 hrs while on Lovenox. 3. Will f/u for conversion to oral anticoagulation as able.  Tad Moore, BCPS  Clinical Pharmacist Pager [phone]  09/06/2014 10:42 AM

## 2014-09-06 NOTE — Progress Notes (Addendum)
CRITICAL VALUE ALERT  Critical value received: INR 7.45  Date of notification:  09/05/14  Time of notification:  1945  Critical value read back:Yes.    Nurse who received alert:  Exie Parody RN  MD notified (1st page):  Dr. Antoine Poche  Time of first page:  1948  Responding MD: Dr. Antoine Poche  Time MD responded: 1950  Orders placed to redraw INR. Will continue to assess and monitor the pt closely.

## 2014-09-06 NOTE — Progress Notes (Signed)
Troponin 68.12. Expected value post PCI. Will continue to monitor.

## 2014-09-06 NOTE — Progress Notes (Signed)
Removed arterial sheath at 2307. I held manual pressure to the site for 30 mins. Pulses were present throught the sheath pull. Pt SBP dropped into the 70s MD at bedside and aware. Pt right groin site is a Level 1 with a slight hematoma. Venous sheath with pacer wire still in place. Pressure dressing applied. Will continue to assess and monitor the pt closely.

## 2014-09-06 NOTE — Progress Notes (Signed)
Patient Name: [name]      SUBJECTIVE: 74 yo female with prior cardiac arrest notable for PAF admitted with IMI in Afib with complete heart block with narrow QRS escape for which she went for emergent cath>>Significant multivessel CAD with the very proximal 70% LAD stenoses, 60% mid distal first diagonal stenosis, 50% stenosis of the LAD between the first and second diagonal branch, 90% second diagonal stenosis; left circumflex 70% stenosis after the takeoff of the first marginal branch with total occlusion of the OM 2 vessel and very distal AV groove circumflex occlusion; and total acute occlusion of the mid RCA with initial TIMI 0 flow. She underwent stenting of RCA-DES with POBA of distal lesion.  EF 45% with some MR   Echo pending  Post procedure course notable for difficulty with sheath removal and hypotension--presumed 2/2 RVMI  Past Medical History  Diagnosis Date  . PAF (paroxysmal atrial fibrillation)   . COPD (chronic obstructive pulmonary disease)   . Hyperlipidemia   . Hypothyroidism   . Arthritis   . Diabetes mellitus   . History of EMG 11/00    negative    Scheduled Meds:  Scheduled Meds: . antiseptic oral rinse  7 mL Mouth Rinse BID  . aspirin EC  81 mg Oral Daily  . enoxaparin (LOVENOX) injection  40 mg Subcutaneous Q24H  . insulin aspart  0-15 Units Subcutaneous TID WC  . insulin aspart  0-5 Units Subcutaneous QHS  . levothyroxine  50 mcg Oral QAC breakfast  . midazolam  2 mg Intravenous Once  . PARoxetine  20 mg Oral Daily  . polyethylene glycol  17 g Oral Daily  . pravastatin  80 mg Oral q1800  . ramipril  5 mg Oral BID   Continuous Infusions: . sodium chloride 90 mL/hr at 09/06/14 0800  . sodium chloride 10 mL/hr at 09/06/14 0700   acetaminophen, acetaminophen, ALPRAZolam, nitroGLYCERIN, ondansetron (ZOFRAN) IV, ondansetron (ZOFRAN) IV, zolpidem    PHYSICAL EXAM Filed Vitals:   09/06/14 0602 09/06/14 0700 09/06/14 0800 09/06/14  0900  BP: 94/58 100/55 87/60 86/50   Pulse:   59 59  Temp:   97.7 F (36.5 C)   TempSrc:   Oral   Resp: Height:      Weight:      SpO2: 100%  100% 100%    General appearance: alert and sleepy  Neck: JVD - 10 cm above sternal notch, no carotid bruit and supple, symmetrical, trachea midline Lungs: clear to auscultation bilaterally Heart: regular rate and rhythm and systolic murmur: holosystolic 2/6,   at apex Abdomen: soft, non-tender; bowel sounds normal; no masses,  no organomegaly Extremities: extremities normal, atraumatic, no cyanosis or edema Pulses: 2+ and symmetric Skin: Skin color, texture, turgor normal. No rashes or lesions Neurologic: Grossly normal but sleepy  TELEMETRY: Reviewed telemetry pt in afib v pacing   Intake/Output Summary (Last 24 hours) at 09/06/14 1007 Last data filed at 09/06/14 0900  Gross per 24 hour  Intake 2057.5 ml  Output    500 ml  Net 1557.5 ml    LABS: Basic Metabolic Panel:  Recent Labs Lab 09/05/14 1735 09/06/14 0605  NA 127* 134*  K 3.0* 3.6  CL 95* 109  CO2 21 21  GLUCOSE 186* 167*  BUN 56* 51*  CREATININE 1.66* 1.16*  CALCIUM 7.8* 7.1*  MG 1.9  --    Cardiac Enzymes:  Recent Labs  09/05/14 1735 09/06/14  0005 09/06/14 0605  CKTOTAL 711*  --   --   CKMB 26.1*  --   --   TROPONINI 68.12* >80.00* 66.50*   CBC:  Recent Labs Lab 09/05/14 1735 09/06/14 0605  WBC 13.8* 15.0*  NEUTROABS 10.5*  --   HGB 12.1 10.4*  HCT 35.8* 30.3*  MCV 84.0 86.3  PLT 194 171   PROTIME:  Recent Labs  09/05/14 1735 09/05/14 1952  LABPROT 63.9* 38.3*  INR 7.45* 3.88*   Liver Function Tests:  Recent Labs  09/06/14 0605  AST 91*  ALT 46*  ALKPHOS 40  BILITOT 0.8  PROT 4.3*  ALBUMIN 2.3*   No results for input(s): LIPASE, AMYLASE in the last 72 hours. BNP: BNP (last 3 results) No results for input(s): PROBNP in the last 8760 hours. D-Dimer: No results for input(s): DDIMER in the last 72  hours. Hemoglobin A1C: No results for input(s): HGBA1C in the last 72 hours. Fasting Lipid Panel:  Recent Labs  09/06/14 0005  CHOL 192  HDL 53  LDLCALC 110*  TRIG 144  CHOLHDL 3.6   Thyroid Function Tests:  Recent Labs  09/05/14 1830  TSH 0.845   Anemia Panel:    ASSESSMENT AND PLAN:  Principal Problem:   ST elevation myocardial infarction (STEMI) of inferior wall with significant residual LAD and LCx disease Active Problems:   Hypothyroidism   Hyperlipemia   Anxiety and depression   Essential hypertension   Atrial fibrillation, persistent   Complete heart block IMI with RVMI  Hypotension with adequate JVP  Will increase HR 60>>80 If BP does not improve with adequate filling as suggested bu JVP will add low dose dopamine Will d/c afterload reduction I anticipate CHB will resolve as had narrow QRS escape suggesting AV nodal site The loss of AV synchrony will have a negative impact on her hemodynamics, but restoration of sinus would not help in the context of complete heart block.  Furthermore we dont know the onset of her Afib She will need triple drug therapy at discharge wll change to full dose heparin for now    Signed, Sherryl Manges MD  09/06/2014

## 2014-09-06 NOTE — ED Provider Notes (Signed)
Her troponin however CSN: [id number]     Arrival date & time 09/05/14  1610 History   First MD Initiated Contact with Patient 09/05/14 1627     Chief Complaint  Patient presents with  . Code STEMI     (Consider location/radiation/quality/duration/timing/severity/associated sxs/prior Treatment) HPI Comments: 74 year old female with history of lipids, high blood pressure, tobacco use presents with recurrent nausea vomiting chest pain shortness of breath for the past 2 days. EMS called code STEMI after significant elevation on EKG. Patient assessed immediately in the ER, patient has no current chest pain or shortness of breath however has had multiple episodes, no cardiac history known. Nothing specifically worsens or improves symptoms.  The history is provided by the patient and the EMS personnel.    Past Medical History  Diagnosis Date  . PAF (paroxysmal atrial fibrillation)   . COPD (chronic obstructive pulmonary disease)   . Hyperlipidemia   . Hypothyroidism   . Arthritis   . Diabetes mellitus   . History of EMG 11/00    negative   Past Surgical History  Procedure Laterality Date  . Ankle fracture surgery      left  . Thyroid colloid cyst   12/00  . Cardiac catheterization  06/16/05    Lmain 20%, LAD 20/40%, D1 & D2 30%, CFX 30%, RCA 50%, EF 75%   . Left heart catheterization with coronary angiogram N/A 09/05/2014    Procedure: LEFT HEART CATHETERIZATION WITH CORONARY ANGIOGRAM;  Surgeon: Lennette Bihari, MD;  Location: [facility] CATH LAB;  Service: Cardiovascular;  Laterality: N/A;   Family History  Problem Relation Age of Onset  . Cancer Mother     breast   History  Substance Use Topics  . Smoking status: Current Every Day Smoker -- 0.50 packs/day    Types: Cigarettes  . Smokeless tobacco: Never Used  . Alcohol Use: 30.0 oz/week    50 Cans of beer per week     Comment: (1) 40 oz, (2) cans beer daily    OB History    No data available     Review of Systems   Constitutional: Positive for appetite change. Negative for fever and chills.  HENT: Negative for congestion.   Eyes: Negative for visual disturbance.  Respiratory: Positive for shortness of breath.   Cardiovascular: Positive for chest pain.  Gastrointestinal: Positive for nausea and vomiting. Negative for abdominal pain.  Genitourinary: Negative for dysuria and flank pain.  Musculoskeletal: Negative for back pain, neck pain and neck stiffness.  Skin: Negative for rash.  Neurological: Positive for light-headedness. Negative for headaches.      Allergies  Atorvastatin; Hydrochlorothiazide w-triamterene; Penicillins; and Statins  Home Medications   Prior to Admission medications   Medication Sig Start Date End Date Taking? Authorizing Provider  aspirin 81 MG chewable tablet Chew 81 mg by mouth daily.   Yes Historical Provider, MD  diltiazem (CARDIZEM CD) 300 MG 24 hr capsule Take 300 mg by mouth daily.     Yes Historical Provider, MD  levothyroxine (SYNTHROID, LEVOTHROID) 50 MCG tablet TAKE 1 TABLET (50 MCG TOTAL) BY MOUTH DAILY. 08/18/14  Yes Terressa Koyanagi, DO  metFORMIN (GLUCOPHAGE) 500 MG tablet Take 500 mg by mouth 2 (two) times daily with a meal.   Yes Historical Provider, MD  metoprolol (LOPRESSOR) 50 MG tablet TAKE 1 AND 1/2 TABLETS BY MOUTH DAILY 08/17/14  Yes Terressa Koyanagi, DO  Multiple Vitamin (MULTI-VITAMINS PO) Take 1 tablet by mouth daily.    Yes  Historical Provider, MD  PARoxetine (PAXIL) 20 MG tablet Take 1 tablet (20 mg total) by mouth daily. PATIENT NEEDS OFFICE VISIT FOR ADDITIONAL REFILLS   Yes Chelle S Jeffery, PA-C  polyethylene glycol (MIRALAX / GLYCOLAX) packet Take 17 g by mouth daily.     Yes Historical Provider, MD  ramipril (ALTACE) 10 MG capsule TAKE 1 CAPSULE BY MOUTH TWICE A DAY 04/01/14  Yes Bruce Rexene Edison Swords, MD   BP 146/83 mmHg  Pulse 59  Temp(Src) 97.8 F (36.6 C) (Oral)  Resp 21  Ht  (1.6 m)  Wt 105 lb (47.628 kg)  BMI 18.60 kg/m2  SpO2  100% Physical Exam  Constitutional: She is oriented to person, place, and time. She appears well-developed.  HENT:  Head: Normocephalic and atraumatic.  Eyes: Conjunctivae are normal. Right eye exhibits no discharge. Left eye exhibits no discharge.  Neck: Normal range of motion. Neck supple. No tracheal deviation present.  Cardiovascular: Regular rhythm.  Bradycardia present.   Pulmonary/Chest: Effort normal and breath sounds normal.  Abdominal: Soft. She exhibits no distension. There is no tenderness. There is no guarding.  Musculoskeletal: She exhibits no edema.  Neurological: She is alert and oriented to person, place, and time.  Skin: Skin is warm. No rash noted.  Psychiatric: She has a normal mood and affect.  Nursing note and vitals reviewed.   ED Course  Procedures (including critical care time)   EMERGENCY DEPARTMENT [place] CARDIAC EXAM "Study: Limited Ultrasound of the heart and pericardium"  INDICATIONS:chest pain/ MI Multiple views of the heart and pericardium were obtained in real-time with a multi-frequency probe.  PERFORMED ZO:XWRUEA  IMAGES ARCHIVED?: Yes  FINDINGS: No pericardial effusion, Decreased contractility and Tamponade physiology absent  LIMITATIONS:  Body habitus  VIEWS USED: Parasternal long axis, Parasternal short axis and Apical 4 chamber   INTERPRETATION: Cardiac activity present, Pericardial effusioin absent, Cardiac tamponade absent and Decreased contractility   Labs Review Labs Reviewed  CBC - Abnormal; Notable for the following:    WBC 13.8 (*)    HCT 35.8 (*)    All other components within normal limits  DIFFERENTIAL - Abnormal; Notable for the following:    Neutro Abs 10.5 (*)    Monocytes Absolute 1.5 (*)    All other components within normal limits  PROTIME-INR - Abnormal; Notable for the following:    Prothrombin Time 63.9 (*)    INR 7.45 (*)    All other components within normal limits  APTT - Abnormal; Notable for the following:     aPTT >200 (*)    All other components within normal limits  BASIC METABOLIC PANEL - Abnormal; Notable for the following:    Sodium 127 (*)    Potassium 3.0 (*)    Chloride 95 (*)    Glucose, Bld 186 (*)    BUN 56 (*)    Creatinine, Ser 1.66 (*)    Calcium 7.8 (*)    GFR calc non Af Amer 29 (*)    GFR calc Af Amer 34 (*)    All other components within normal limits  TROPONIN I - Abnormal; Notable for the following:    Troponin I 68.12 (*)    All other components within normal limits  CK TOTAL AND CKMB - Abnormal; Notable for the following:    Total CK 711 (*)    CK, MB 26.1 (*)    Relative Index 3.7 (*)    All other components within normal limits  LIPID PANEL -  Abnormal; Notable for the following:    LDL Cholesterol 106 (*)    All other components within normal limits  PROTIME-INR - Abnormal; Notable for the following:    Prothrombin Time 38.3 (*)    INR 3.88 (*)    All other components within normal limits  MRSA PCR SCREENING  MAGNESIUM  TSH  TROPONIN I  TROPONIN I  COMPREHENSIVE METABOLIC PANEL  CBC  LIPID PANEL  HEMOGLOBIN A1C  TROPONIN I  I-STAT TROPOININ, ED  I-STAT CHEM 8, ED    Imaging Review Dg Chest Portable 1 View  09/05/2014   CLINICAL DATA:  Acute myocardial infarction.  EXAM: PORTABLE CHEST - 1 VIEW  COMPARISON:  04/19/2013  FINDINGS: Heart size and pulmonary vascularity are within normal limits in the lungs are clear. No acute osseous abnormality. Slight tortuosity and calcification of the thoracic aorta. Temporary pacing lead in place.  IMPRESSION: Temporary pacing lead in place.  No acute abnormalities.   Electronically Signed   By: Geanie Cooley M.D.   On: 09/05/2014 19:01     EKG Interpretation   Date/Time:  Saturday September 05 2014 16:16:15 EST Ventricular Rate:  42 PR Interval:  150 QRS Duration: 146 QT Interval:  556 QTC Calculation: 465 R Axis:   -36 Text Interpretation:  Sinus bradycardia Nonspecific IVCD with LAD Left  ventricular  hypertrophy Anterolateral infarct, acute Confirmed by August Gosser   MD, Micah Barnier ([id number]) on 09/06/2014 12:14:07 AM      MDM   Final diagnoses:  STEMI (ST elevation myocardial infarction)  Bradycardia  Non-intractable vomiting without nausea, vomiting of unspecified type   patient presents with concerns for acute myocardial infarction and ST elevation. EKG/rhythm strip reviewed in the field heart rate 42, significant elevation inferiorly and laterally, difficulty assessing whether sinus due to baseline, mild prolonged QT. Patient has no chest pain or short of breath in the ED, mild low blood pressure prior to arrival. Suspect right-sided/inferior heart attack versus stress-induced cardiomyopathy. Aspirin ordered, discussed with interventional cardiologist was read in the Cath Lab.  Bedside ultrasound performed, decreased ejection fraction.  The patients results and plan were reviewed and discussed.   Any x-rays performed were personally reviewed by myself.   Differential diagnosis were considered with the presenting HPI.  Medications  nitroGLYCERIN (NITROSTAT) SL tablet 0.4 mg (not administered)  acetaminophen (TYLENOL) tablet 650 mg (not administered)  ondansetron (ZOFRAN) injection 4 mg (4 mg Intravenous Given 09/05/14 2301)  zolpidem (AMBIEN) tablet 5 mg (not administered)  enoxaparin (LOVENOX) injection 40 mg (not administered)  ALPRAZolam (XANAX) tablet 0.25 mg (0.25 mg Oral Given 09/05/14 2128)  levothyroxine (SYNTHROID, LEVOTHROID) tablet 50 mcg (not administered)  PARoxetine (PAXIL) tablet 20 mg (not administered)  polyethylene glycol (MIRALAX / GLYCOLAX) packet 17 g (not administered)  insulin aspart (novoLOG) injection 0-15 Units (not administered)  insulin aspart (novoLOG) injection 0-5 Units (2 Units Subcutaneous Given 09/05/14 2210)  ramipril (ALTACE) capsule 5 mg (5 mg Oral Given 09/05/14 2128)  0.9 %  sodium chloride infusion ( Intravenous New Bag/Given 09/05/14 1829)  aspirin EC tablet  81 mg (81 mg Oral Given by Other 09/05/14 2000)  ticagrelor (BRILINTA) tablet 90 mg (90 mg Oral Given 09/05/14 2127)  bivalirudin (ANGIOMAX) 250 mg in sodium chloride 0.9 % 50 mL (5 mg/mL) infusion (0 mg/kg/hr  47.6 kg Intravenous Duplicate 09/05/14 1942)  pravastatin (PRAVACHOL) tablet 80 mg (80 mg Oral Given 09/05/14 2127)  antiseptic oral rinse (CPC / CETYLPYRIDINIUM CHLORIDE 0.05%) solution 7 mL (7 mLs Mouth Rinse  Given 09/05/14 2200)  acetaminophen (TYLENOL) tablet 650 mg (not administered)  ondansetron (ZOFRAN) injection 4 mg (not administered)  0.9 %  sodium chloride infusion (not administered)  aspirin EC tablet 81 mg (not administered)  clopidogrel (PLAVIX) tablet 75 mg (not administered)  clopidogrel (PLAVIX) tablet 600 mg (not administered)  midazolam (VERSED) injection 2 mg (not administered)  midazolam (VERSED) 2 MG/2ML injection (not administered)  aspirin chewable tablet 324 mg ( Oral Duplicate 09/05/14 1630)  heparin injection 60 Units/kg (4,000 Units Intravenous Given 09/05/14 1622)  midazolam (VERSED) 2 MG/2ML injection (not administered)  fentaNYL (SUBLIMAZE) 0.05 MG/ML injection (not administered)  bivalirudin (ANGIOMAX) 250 MG injection (not administered)  ticagrelor (BRILINTA) 90 MG tablet (not administered)  potassium chloride 10 mEq in 100 mL IVPB (10 mEq Intravenous Given 09/05/14 1933)  nitroGLYCERIN 100 MCG/ML intra-arterial injection (not administered)  lidocaine (PF) (XYLOCAINE) 1 % injection (not administered)  heparin 2-0.9 UNIT/ML-% infusion (not administered)  LORazepam (ATIVAN) injection 0.5 mg (0.5 mg Intravenous Given 09/05/14 2320)  LORazepam (ATIVAN) 2 MG/ML injection (  Duplicate 09/05/14 2330)  hydrALAZINE (APRESOLINE) injection 10 mg (10 mg Intravenous Given 09/05/14 2335)  hydrALAZINE (APRESOLINE) 20 MG/ML injection (  Duplicate 09/05/14 2345)    Filed Vitals:   09/05/14 1900 09/05/14 2000 09/05/14 2100 09/05/14 2200  BP: 123/75 114/65 118/64 146/83  Pulse: 59 59 59  59  Temp:  97.8 F (36.6 C)    TempSrc:  Oral    Resp: 18 14 10 21   Height:      Weight:      SpO2: 100% 100% 100% 100%    Final diagnoses:  STEMI (ST elevation myocardial infarction)  Bradycardia  Non-intractable vomiting without nausea, vomiting of unspecified type    Admission/ observation were discussed with the admitting physician, patient and/or family and they are comfortable with the plan.      Enid Skeens, MD 09/06/14 [phone]

## 2014-09-06 NOTE — Progress Notes (Signed)
Pt reporting chest pain intermittent 8/10. Dr. Graciela Husbands made aware. Suggested to start Dopamine at 2.5 mcg to increase blood pressure. 12 lead EKG performed. Pt states pain in across front of lower chest and is the same feeling she experienced prior to cath yesterday. Will continue to monitor closely.

## 2014-09-06 NOTE — Progress Notes (Signed)
Pt BP remains low. SBP in the 70-80s. MD awareverbal order to continue the Normal Saline at 157ml/hr. Will continue to assess the pt closely.

## 2014-09-07 ENCOUNTER — Inpatient Hospital Stay (HOSPITAL_COMMUNITY): Payer: Medicare Other

## 2014-09-07 DIAGNOSIS — Z45018 Encounter for adjustment and management of other part of cardiac pacemaker: Secondary | ICD-10-CM

## 2014-09-07 DIAGNOSIS — I469 Cardiac arrest, cause unspecified: Secondary | ICD-10-CM

## 2014-09-07 DIAGNOSIS — Z955 Presence of coronary angioplasty implant and graft: Secondary | ICD-10-CM

## 2014-09-07 DIAGNOSIS — I25119 Atherosclerotic heart disease of native coronary artery with unspecified angina pectoris: Secondary | ICD-10-CM

## 2014-09-07 DIAGNOSIS — I10 Essential (primary) hypertension: Secondary | ICD-10-CM

## 2014-09-07 DIAGNOSIS — Z4689 Encounter for fitting and adjustment of other specified devices: Secondary | ICD-10-CM

## 2014-09-07 DIAGNOSIS — I2510 Atherosclerotic heart disease of native coronary artery without angina pectoris: Secondary | ICD-10-CM | POA: Diagnosis present

## 2014-09-07 DIAGNOSIS — R001 Bradycardia, unspecified: Secondary | ICD-10-CM | POA: Insufficient documentation

## 2014-09-07 DIAGNOSIS — E785 Hyperlipidemia, unspecified: Secondary | ICD-10-CM

## 2014-09-07 DIAGNOSIS — I059 Rheumatic mitral valve disease, unspecified: Secondary | ICD-10-CM

## 2014-09-07 DIAGNOSIS — E118 Type 2 diabetes mellitus with unspecified complications: Secondary | ICD-10-CM

## 2014-09-07 LAB — POCT I-STAT, CHEM 8
BUN: 47 mg/dL — AB (ref 6–23)
BUN: 51 mg/dL — AB (ref 6–23)
CALCIUM ION: 0.97 mmol/L — AB (ref 1.13–1.30)
CALCIUM ION: 1.15 mmol/L (ref 1.13–1.30)
CREATININE: 0.7 mg/dL (ref 0.50–1.10)
Chloride: 69 mEq/L — ABNORMAL LOW (ref 96–112)
Chloride: 95 mEq/L — ABNORMAL LOW (ref 96–112)
Creatinine, Ser: 1.7 mg/dL — ABNORMAL HIGH (ref 0.50–1.10)
GLUCOSE: 118 mg/dL — AB (ref 70–99)
Glucose, Bld: 187 mg/dL — ABNORMAL HIGH (ref 70–99)
HCT: 43 % (ref 36.0–46.0)
HCT: 41 % (ref 36.0–46.0)
Hemoglobin: 14.6 g/dL (ref 12.0–15.0)
Hemoglobin: 13.9 g/dL (ref 12.0–15.0)
POTASSIUM: 2.2 mmol/L — AB (ref 3.5–5.1)
POTASSIUM: 3.2 mmol/L — AB (ref 3.5–5.1)
Sodium: 104 mmol/L — CL (ref 135–145)
Sodium: 130 mmol/L — ABNORMAL LOW (ref 135–145)
TCO2: 15 mmol/L (ref 0–100)
TCO2: 21 mmol/L (ref 0–100)

## 2014-09-07 LAB — BASIC METABOLIC PANEL
ANION GAP: 7 (ref 5–15)
BUN: 44 mg/dL — ABNORMAL HIGH (ref 6–23)
CALCIUM: 8.4 mg/dL (ref 8.4–10.5)
CO2: 20 mmol/L (ref 19–32)
CREATININE: 1.12 mg/dL — AB (ref 0.50–1.10)
Chloride: 108 mEq/L (ref 96–112)
GFR calc non Af Amer: 47 mL/min — ABNORMAL LOW (ref >90)
GFR, EST AFRICAN AMERICAN: 54 mL/min — AB (ref >90)
Glucose, Bld: 203 mg/dL — ABNORMAL HIGH (ref 70–99)
Potassium: 3.7 mmol/L (ref 3.5–5.1)
Sodium: 135 mmol/L (ref 135–145)

## 2014-09-07 LAB — GLUCOSE, CAPILLARY
GLUCOSE-CAPILLARY: 170 mg/dL — AB (ref 70–99)
GLUCOSE-CAPILLARY: 217 mg/dL — AB (ref 70–99)
GLUCOSE-CAPILLARY: 239 mg/dL — AB (ref 70–99)
Glucose-Capillary: 128 mg/dL — ABNORMAL HIGH (ref 70–99)

## 2014-09-07 LAB — POCT ACTIVATED CLOTTING TIME: Activated Clotting Time: 915 seconds

## 2014-09-07 LAB — PROTIME-INR
INR: 1.21 (ref 0.00–1.49)
Prothrombin Time: 15.5 seconds — ABNORMAL HIGH (ref 11.6–15.2)

## 2014-09-07 LAB — HEPARIN LEVEL (UNFRACTIONATED): Heparin Unfractionated: 0.48 IU/mL (ref 0.30–0.70)

## 2014-09-07 MED ORDER — "THROMBI-PAD 3""X3"" EX PADS"
1.0000 | MEDICATED_PAD | Freq: Once | CUTANEOUS | Status: AC
  Administered 2014-09-07: 1 via TOPICAL
  Filled 2014-09-07: qty 1

## 2014-09-07 MED ORDER — DOPAMINE-DEXTROSE 3.2-5 MG/ML-% IV SOLN
0.0000 ug/kg/min | INTRAVENOUS | Status: DC
Start: 2014-09-07 — End: 2014-09-09

## 2014-09-07 MED ORDER — BOOST / RESOURCE BREEZE PO LIQD
1.0000 | Freq: Three times a day (TID) | ORAL | Status: DC
  Administered 2014-09-07 – 2014-09-08 (×4): 1 via ORAL
  Administered 2014-09-09: 237 mL via ORAL
  Administered 2014-09-09 – 2014-09-11 (×5): 1 via ORAL

## 2014-09-07 MED ORDER — ENOXAPARIN SODIUM 60 MG/0.6ML ~~LOC~~ SOLN
1.0000 mg/kg | Freq: Two times a day (BID) | SUBCUTANEOUS | Status: DC
  Filled 2014-09-07 (×3): qty 0.6

## 2014-09-07 MED ORDER — HEPARIN (PORCINE) IN NACL 100-0.45 UNIT/ML-% IJ SOLN
700.0000 [IU]/h | INTRAMUSCULAR | Status: DC
  Administered 2014-09-07: 700 [IU]/h via INTRAVENOUS
  Filled 2014-09-07 (×2): qty 250

## 2014-09-07 MED FILL — Sodium Chloride IV Soln 0.9%: INTRAVENOUS | Qty: 50 | Status: AC

## 2014-09-07 NOTE — Telephone Encounter (Signed)
Dr Kim-is this OK to refill?  Looks like the pt was given  in the hospital?

## 2014-09-07 NOTE — Progress Notes (Signed)
Patient's right groin site noted to have a slow trickle ooze. Pressure was held at site for and pressure dressing was applied afterwards to achieve hemostasis.

## 2014-09-07 NOTE — Progress Notes (Signed)
ANTICOAGULATION CONSULT NOTE - Follow Up Consult  Pharmacy Consult for Lovenox->Heparin Indication: atrial fibrillation  Allergies  Allergen Reactions  . Atorvastatin Other (See Comments)    Muscle aches  . Hydrochlorothiazide W-Triamterene     REACTION: palpitations  . Penicillins     REACTION: urticaria (hives)  . Statins     Subjective myalgia    Patient Measurements: Height:  (160 cm) Weight: 112 lb 9.6 oz (51.075 kg) IBW/kg (Calculated) : 52.4 Heparin Dosing Weight: 51kg  Vital Signs: Temp: 98.5 F (36.9 C) (01/04 1100) Temp Source: Oral (01/04 1100) BP: 97/55 mmHg (01/04 1100) Pulse Rate: 80 (01/04 1100)  Labs:  Recent Labs  09/05/14 1735 09/05/14 1952 09/06/14 0005 09/06/14 0605 09/07/14 0330  HGB 12.1  --   --  10.4*  --   HCT 35.8*  --   --  30.3*  --   PLT 194  --   --  171  --   APTT >200*  --   --   --   --   LABPROT 63.9* 38.3*  --   --  15.5*  INR 7.45* 3.88*  --   --  1.21  CREATININE 1.66*  --   --  1.16* 1.12*  CKTOTAL 711*  --   --   --   --   CKMB 26.1*  --   --   --   --   TROPONINI 68.12*  --  >80.00* 66.50*  --     Estimated Creatinine Clearance: 35 mL/min (by C-G formula based on Cr of 1.12).  Assessment: 73yof initially started on full dose lovenox for her afib now being transitioned to IV heparin as she may need a permanent pacemaker and/or PCI to her LAD. She received  of lovenox yesterday at 1706 (yesterday's weight inaccurately entered as 111.2mg , real weight is 51kg so she received double the dose). This morning's lovenox dose was not given due to temporary pacemaker site bleeding - still oozing, treating with thrombi pad. CBC is stable.  Goal of Therapy:  Heparin level 0.3-0.7 units/ml Monitor platelets by anticoagulation protocol: Yes   Plan:  1) Start heparin at 700 units/hr with no bolus 2) Check 8 hour heparin level 3) Daily heparin level and CBC 4) Follow up TMP site bleeding 5) Follow up plans for  procedures  Fredrik Rigger 09/07/2014,12:33 PM

## 2014-09-07 NOTE — Progress Notes (Signed)
Tried to waste 1.5 IV Ativan in the Pyxis with second nurse the waste would not disappear. Spoke with Pharmacy and was advised to make a waste note in the patients chart.  Wasted 1.5 IV Ativan in the sharps container with [place] Perkins RN.

## 2014-09-07 NOTE — Progress Notes (Signed)
Pacemaker rate turned up to 60. Patient feeling dizzy and lightheaded with rate at 40.  MD Graciela Husbands notified.

## 2014-09-07 NOTE — Care Management Note (Addendum)
    Page 1 of 2   09/11/2014     2:24:40 PM CARE MANAGEMENT NOTE 09/11/2014  Patient:  [name], [name]   Account Number:  [id number]  Date Initiated:  09/07/2014  Documentation initiated by:  Junius Creamer  Subjective/Objective Assessment:   adm w mi     Action/Plan:   lives w husband, pcp dr Herold Harms   Anticipated DC Date:  09/11/2014   Anticipated DC Plan:  HOME W HOME HEALTH SERVICES      DC Planning Services  CM consult  Medication Assistance  Other      [facility]  HOME HEALTH   Choice offered to / List presented to:  C-1 Patient   DME arranged  BEDSIDE COMMODE      DME agency  [facility]     [facility] arranged  HH-1 RN  HH-10 DISEASE MANAGEMENT  HH-2 PT      HH agency  [facility]   Status of service:  Completed, signed off Medicare Important Message given?  YES (If response is "NO", the following Medicare IM given date fields will be blank) Date Medicare IM given:  09/08/2014 Medicare IM given by:  Junius Creamer Date Additional Medicare IM given:  09/11/2014 Additional Medicare IM given by:  Sidney Ace  Discharge Disposition:  HOME [facility] SERVICES  Per UR Regulation:  Reviewed for med. necessity/level of care/duration of stay  If discussed at Long Length of Stay Meetings, dates discussed:   09/10/2014    Comments:  09/11/13 Sidney Ace, RN, BSN [phone] Pt for dc to daughter's home today in care on daughter and son in law.  Pt will need HH follow up; orders received from PA; referral to [facility], per pt choice for [facility] services. Pt requests [facility] for home.  Referral to [facility] for DME needs. PA B. Hager reminded of need for prior auth for Eliquis. Pt to dc with Dennard Nip and March Rummage, (daughter and son in law) Address:  [address]                  [place], [place] [address]                  daughter cell:  [phone]                  son in law cell:  [phone] Pt given information on stair lifts and local contacts  in the area for review with her daughter.    She is appreciative of this help.   Crystal Hutchinson RN, BSN, MSHL, CCM  Nurse - Case Manager,  (Unit [place]) [phone]  09/10/2014 Social:  Hx/o from home with husband and son.  Patient is caregiver to both individuals.  Husband d/c home today from [facility]. Benefits Check Update: eliquis: auth required [phone]/ tier 3/ $85.00 co-pay at retail / $212.50 90 day mail order patient can use[facility]:  agreed to services Patient plans to d/c to DTR's home for post hospital care. DTR planning to take FMLA.   States bedrooms are upstairs and patient interested in stair lift. HHS:  RN, PT ([facility]) - CM to notify [facility] __________pending. DTR's address:  Pending _____________   1/5  1451 debbie dowell rn,bsn gave pt 30day free eliquis card. pt states she does have ins for meds.  1/4 1035a debbie dowell rn,bsn gave pt 30day free brilinta card.

## 2014-09-07 NOTE — Progress Notes (Signed)
  Echocardiogram 2D Echocardiogram has been performed.  Leta Jungling M 09/07/2014, 10:54 AM

## 2014-09-07 NOTE — Progress Notes (Signed)
Patient Name: [name]      SUBJECTIVE: 74 yo female with prior cardiac arrest notable for PAF admitted with IMI in Afib with complete heart block with narrow QRS escape for which she went for emergent cath>>Significant multivessel CAD with the very proximal 70% LAD stenoses, 60% mid distal first diagonal stenosis, 50% stenosis of the LAD between the first and second diagonal branch, 90% second diagonal stenosis; left circumflex 70% stenosis after the takeoff of the first marginal branch with total occlusion of the OM 2 vessel and very distal AV groove circumflex occlusion; and total acute occlusion of the mid RCA with initial TIMI 0 flow. She underwent stenting of RCA-DES with POBA of distal lesion.  EF 45% with some MR   Echo pending  Post procedure course notable for difficulty with sheath removal and hypotension--presumed 2/2 RVMI  Asked to see her today because of ongoing heart block    She has done much better hemodynamically since on dopamine   Past Medical History  Diagnosis Date  . PAF (paroxysmal atrial fibrillation)   . COPD (chronic obstructive pulmonary disease)   . Hyperlipidemia   . Hypothyroidism   . Arthritis   . Diabetes mellitus   . History of EMG 11/00    negative    Scheduled Meds:  Scheduled Meds: . antiseptic oral rinse  7 mL Mouth Rinse BID  . aspirin EC  81 mg Oral Daily  . feeding supplement (RESOURCE BREEZE)  1 Container Oral TID BM  . insulin aspart  0-15 Units Subcutaneous TID WC  . insulin aspart  0-5 Units Subcutaneous QHS  . levothyroxine  50 mcg Oral QAC breakfast  . PARoxetine  20 mg Oral Daily  . polyethylene glycol  17 g Oral Daily  . pravastatin  80 mg Oral q1800  . ticagrelor  90 mg Oral BID   Continuous Infusions: . sodium chloride 10 mL/hr at 09/06/14 0700  . DOPamine 2 mcg/kg/min (09/07/14 1009)  . heparin 700 Units/hr (09/07/14 1100)   acetaminophen, acetaminophen, ALPRAZolam, nitroGLYCERIN, ondansetron  (ZOFRAN) IV, ondansetron (ZOFRAN) IV, zolpidem    PHYSICAL EXAM Filed Vitals:   09/07/14 1400 09/07/14 1500 09/07/14 1600 09/07/14 1700  BP: 127/59 122/63 120/70 128/66  Pulse: 77 80 80 80  Temp:   98.3 F (36.8 C)   TempSrc:   Oral   Resp: Height:      Weight:      SpO2: 100% 100% 100% 100%    General appearance: alert and sleepy  Neck: JVD 8 above sternal notch, no carotid bruit and supple, symmetrical, trachea midline Lungs: clear to auscultation bilaterally Heart: regular rate and rhythm and systolic murmur: holosystolic 2/6,   at apex Abdomen: soft, non-tender; bowel sounds normal; no masses,  no organomegaly Extremities: extremities normal, atraumatic, no cyanosis or edema Pulses: 2+ and symmetric Skin: Skin color, texture, turgor normal. No rashes or lesions Neurologic: Grossly normal but sleepy  TELEMETRY: Reviewed telemetry pt in afib v pacing   Intake/Output Summary (Last 24 hours) at 09/07/14 1744 Last data filed at 09/07/14 1700  Gross per 24 hour  Intake 2473.11 ml  Output    865 ml  Net 1608.11 ml    LABS: Basic Metabolic Panel:  Recent Labs Lab 09/05/14 1735 09/06/14 0605 09/07/14 0330  NA 127* 134* 135  K 3.0* 3.6 3.7  CL 95* 109 108  CO2 GLUCOSE 186* 167* 203*  BUN 56* 51* 44*  CREATININE 1.66* 1.16* 1.12*  CALCIUM 7.8* 7.1* 8.4  MG 1.9  --   --    Cardiac Enzymes:  Recent Labs  09/05/14 1735 09/06/14 0005 09/06/14 0605  CKTOTAL 711*  --   --   CKMB 26.1*  --   --   TROPONINI 68.12* >80.00* 66.50*   CBC:  Recent Labs Lab 09/05/14 1735 09/06/14 0605  WBC 13.8* 15.0*  NEUTROABS 10.5*  --   HGB 12.1 10.4*  HCT 35.8* 30.3*  MCV 84.0 86.3  PLT 194 171   PROTIME:  Recent Labs  09/05/14 1735 09/05/14 1952 09/07/14 0330  LABPROT 63.9* 38.3* 15.5*  INR 7.45* 3.88* 1.21   Liver Function Tests:  Recent Labs  09/06/14 0605  AST 91*  ALT 46*  ALKPHOS 40  BILITOT 0.8  PROT 4.3*  ALBUMIN 2.3*    No results for input(s): LIPASE, AMYLASE in the last 72 hours. BNP: BNP (last 3 results) No results for input(s): PROBNP in the last 8760 hours. D-Dimer: No results for input(s): DDIMER in the last 72 hours. Hemoglobin A1C:  Recent Labs  09/05/14 1830  HGBA1C 7.3*   Fasting Lipid Panel:  Recent Labs  09/06/14 0005  CHOL 192  HDL 53  LDLCALC 110*  TRIG 144  CHOLHDL 3.6   Thyroid Function Tests:  Recent Labs  09/05/14 1830  TSH 0.845   Anemia Panel:    ASSESSMENT AND PLAN:  Principal Problem:   ST elevation myocardial infarction (STEMI) of inferior wall with significant residual LAD and LCx disease Active Problems:   Hypothyroidism   Hyperlipemia   Anxiety and depression   Essential hypertension   Atrial fibrillation, persistent   Complete heart block IMI with RVMI  Hypotension with adequate JVP   Pacer interrogated and there is underlying conduction at 50 bpm  Will leave pacer set at 40 for now and hopefully will have improved AV conduction  She may stillcome to pacing  The next question is whether she needs anticoagulation  But for now if rhtyhm stable in am would take out sheath as she is oozing  If more observation is needed would put in temp perm    Signed, Sherryl Manges MD  09/07/2014

## 2014-09-07 NOTE — Progress Notes (Signed)
ANTICOAGULATION CONSULT NOTE - Follow Up Consult  Pharmacy Consult for Heparin Indication: atrial fibrillation  Allergies  Allergen Reactions  . Atorvastatin Other (See Comments)    Muscle aches  . Hydrochlorothiazide W-Triamterene     REACTION: palpitations  . Penicillins     REACTION: urticaria (hives)  . Statins     Subjective myalgia    Patient Measurements: Height:  (160 cm) Weight: 112 lb 9.6 oz (51.075 kg) IBW/kg (Calculated) : 52.4 Heparin Dosing Weight: 51kg  Vital Signs: Temp: 98.3 F (36.8 C) (01/04 1600) Temp Source: Oral (01/04 1600) BP: 127/53 mmHg (01/04 1800) Pulse Rate: 49 (01/04 1800)  Labs:  Recent Labs  09/05/14 1707 09/05/14 1735 09/05/14 1952 09/06/14 0005 09/06/14 0605 09/07/14 0330 09/07/14 1752  HGB 14.6 13.9  12.1  --   --  10.4*  --   --   HCT 43.0 41.0  35.8*  --   --  30.3*  --   --   PLT  --  194  --   --  171  --   --   APTT  --  >200*  --   --   --   --   --   LABPROT  --  63.9* 38.3*  --   --  15.5*  --   INR  --  7.45* 3.88*  --   --  1.21  --   HEPARINUNFRC  --   --   --   --   --   --  0.48  CREATININE 0.70 1.70*  1.66*  --   --  1.16* 1.12*  --   CKTOTAL  --  711*  --   --   --   --   --   CKMB  --  26.1*  --   --   --   --   --   TROPONINI  --  68.12*  --  >80.00* 66.50*  --   --     Estimated Creatinine Clearance: 35 mL/min (by C-G formula based on Cr of 1.12).  Assessment: 73yof initially started on full dose lovenox for her afib now being transitioned to IV heparin as she may need a permanent pacemaker and/or PCI to her LAD. She received  of lovenox yesterday at 1706 (yesterday's weight inaccurately entered as 111.2mg , real weight is 51kg so she received double the dose). This morning's lovenox dose was not given due to temporary pacemaker site bleeding - still oozing, treating with thrombi pad. CBC is stable.  Initial heparin level is therapeutic at 0.48 on heparin 700 units/hr. Nurse reports that  bleeding at temporary pacemaker site has improved, though still ongoing.  Goal of Therapy:  Heparin level 0.3-0.7 units/ml Monitor platelets by anticoagulation protocol: Yes   Plan:  - Continue heparin at 700 units/hr - Daily heparin level and CBC - Follow up TMP site bleeding - Follow up plans for procedures  Arlean Hopping. Newman Pies, PharmD Clinical Pharmacist Pager [phone] 09/07/2014,7:33 PM

## 2014-09-07 NOTE — Progress Notes (Addendum)
INITIAL NUTRITION ASSESSMENT  DOCUMENTATION CODES Per approved criteria  -Severe malnutrition in the context of chronic illness   INTERVENTION: Resource Breeze po TID, each supplement provides 250 kcal and 9 grams of protein Magic cup BID between meals, each supplement provides 290 kcal and 9 grams of protein   NUTRITION DIAGNOSIS: Malnutrition related to chronic illness as evidenced by 11% weight loss x 5 months and intake of </= 75% of her needs for >/= 1 month.   Goal: Pt to meet >/= 90% of their estimated nutrition needs   Monitor:  Diet advancement, PO intake, supplement acceptance, weight trends  Reason for Assessment: MD consult  74 y.o. female  Admitting Dx: ST elevation myocardial infarction (STEMI) of inferior wall  ASSESSMENT: Pt admitted from home where she lives with her husband with STEMI.  Per pt she has being losing weight for the last 2 years due to stress of pt's husbands illness. Husband dx with cancer underwent treatments and then got PNA. She has been primary caregiver and this took a toll on her. She has no appetite and reports not eating anything most days. She does not like ensure but willing to try Breeze.   Nutrition Focused Physical Exam:  Subcutaneous Fat:  Orbital Region: WDL Upper Arm Region: WDL Thoracic and Lumbar Region: mild/moderate depletion  Muscle:  Temple Region: mild/moderate depletion Clavicle Bone Region: mild/moderate depletion Clavicle and Acromion Bone Region: mild/moderate depletion Scapular Bone Region: mild/moderate depletion Dorsal Hand: WDL Patellar Region: WDL Anterior Thigh Region: WDL Posterior Calf Region: WDL  Edema: not presetn   Height: Ht Readings from Last 1 Encounters:  09/05/14  (1.6 m)    Weight: Wt Readings from Last 1 Encounters:  09/07/14 112 lb 9.6 oz (51.075 kg)    Ideal Body Weight: 52.2 kg   % Ideal Body Weight: 98%  Wt Readings from Last 10 Encounters:  09/07/14 112 lb 9.6 oz  (51.075 kg)  08/18/14 112 lb 12.8 oz (51.166 kg)  06/16/14 113 lb 12.8 oz (51.619 kg)  06/08/14 113 lb 8 oz (51.483 kg)  04/29/14 112 lb 8 oz (51.03 kg)  02/01/14 115 lb 8 oz (52.39 kg)  01/23/14 118 lb (53.524 kg)  11/18/13 122 lb (55.339 kg)  11/04/13 119 lb (53.978 kg)  05/30/13 120 lb (54.432 kg)    Usual Body Weight: 122 lb   % Usual Body Weight: 89%  BMI:  Body mass index is 19.95 kg/(m^2).  Estimated Nutritional Needs: Kcal: 1500-1700 Protein: 75-85 grams Fluid: > 1.5 L/day  Skin: WDL  Diet Order: Diet full liquid  EDUCATION NEEDS: -No education needs identified at this time   Intake/Output Summary (Last 24 hours) at 09/07/14 1437 Last data filed at 09/07/14 1400  Gross per 24 hour  Intake 2271.79 ml  Output    990 ml  Net 1281.79 ml    Last BM: PTA   Labs:   Recent Labs Lab 09/05/14 1735 09/06/14 0605 09/07/14 0330  NA 127* 134* 135  K 3.0* 3.6 3.7  CL 95* 109 108  CO2 BUN 56* 51* 44*  CREATININE 1.66* 1.16* 1.12*  CALCIUM 7.8* 7.1* 8.4  MG 1.9  --   --   GLUCOSE 186* 167* 203*    CBG (last 3)   Recent Labs  09/06/14 2110 09/07/14 0736 09/07/14 1148  GLUCAP 166* 170* 217*    Scheduled Meds: . antiseptic oral rinse  7 mL Mouth Rinse BID  . aspirin EC  81  mg Oral Daily  . feeding supplement (ENSURE COMPLETE)  237 mL Oral BID BM  . insulin aspart  0-15 Units Subcutaneous TID WC  . insulin aspart  0-5 Units Subcutaneous QHS  . levothyroxine  50 mcg Oral QAC breakfast  . PARoxetine  20 mg Oral Daily  . polyethylene glycol  17 g Oral Daily  . pravastatin  80 mg Oral q1800  . ticagrelor  90 mg Oral BID    Continuous Infusions: . sodium chloride 10 mL/hr at 09/06/14 0700  . sodium chloride 50 mL/hr at 09/07/14 0840  . DOPamine 2 mcg/kg/min (09/07/14 1009)  . heparin 700 Units/hr (09/07/14 1100)    Past Medical History  Diagnosis Date  . PAF (paroxysmal atrial fibrillation)   . COPD (chronic obstructive pulmonary  disease)   . Hyperlipidemia   . Hypothyroidism   . Arthritis   . Diabetes mellitus   . History of EMG 11/00    negative    Past Surgical History  Procedure Laterality Date  . Ankle fracture surgery      left  . Thyroid colloid cyst   12/00  . Cardiac catheterization  06/16/05    Lmain 20%, LAD 20/40%, D1 & D2 30%, CFX 30%, RCA 50%, EF 75%   . Left heart catheterization with coronary angiogram N/A 09/05/2014    Procedure: LEFT HEART CATHETERIZATION WITH CORONARY ANGIOGRAM;  Surgeon: Lennette Bihari, MD;  Location: [facility] CATH LAB;  Service: Cardiovascular;  Laterality: N/A;    Kendell Bane RD, LDN, CNSC [phone] Pager [phone] After Hours Pager

## 2014-09-07 NOTE — Progress Notes (Signed)
[facility] Progress Note  74 y/o woman with DM-2 admitted 1/2 with Inf STEMI complicated by AFib & CHB --> PCI to mRCA 100%/PTCA of PLB & TPM placed. Was hypotensive post-PCI - started on Dopamine infusion & IVF yesterday --> continues to be TPM dependent.  Has residual CAD in LAD & Cx. EF ~40% by cath  Subjective:  Feels better - no CP,  R groin "sore" - but no hematoma - mild ooze from TPM site  Objective:  Vital Signs in the last 24 hours: Temp:  [98 F (36.7 C)-98.2 F (36.8 C)] 98.2 F (36.8 C) (01/04 0700) Pulse Rate:  [79-81] 79 (01/04 0700) Resp:  [10-25] 10 (01/04 0700) BP: (82-160)/(44-89) 142/74 mmHg (01/04 0700) SpO2:  [95 %-100 %] 99 % (01/04 0700) Weight:  [112 lb 9.6 oz (51.075 kg)] 112 lb 9.6 oz (51.075 kg) (01/04 0530)  Intake/Output from previous day: 01/03 0701 - 01/04 0700 In: 2739.5 [P.O.:300; I.V.:2439.5] Out: 1020 [Urine:1020] Intake/Output from this shift:    Physical Exam: General appearance: alert, cooperative, appears stated age, no distress and thin & frail appearing.   Neck: no adenopathy, no carotid bruit, supple, symmetrical, trachea midline and pulsatile JVP with mild distension.   Lungs: clear to auscultation bilaterally, normal percussion bilaterally and poor effort Heart: Reg Rate & Rhythm - paced.  ~1/6 HSM @ apex; No R/G Abdomen: soft, non-tender; bowel sounds normal; no masses,  no organomegaly Extremities: extremities normal, atraumatic, no cyanosis or edema and R groin cath site with pressure dressing - mild ooze noted. Pulses: 2+ and symmetric Neurologic: Grossly normal  Lab Results:  Recent Labs  09/05/14 1735 09/06/14 0605  WBC 13.8* 15.0*  HGB 12.1 10.4*  PLT 194 171    Recent Labs  09/06/14 0605 09/07/14 0330  NA 134* 135  K 3.6 3.7  CL 109 108  CO2 21 20  GLUCOSE 167* 203*  BUN 51* 44*  CREATININE 1.16* 1.12*    Recent Labs  09/06/14 0005 09/06/14 0605  TROPONINI >80.00* 66.50*   Hepatic Function  Panel  Recent Labs  09/06/14 0605  PROT 4.3*  ALBUMIN 2.3*  AST 91*  ALT 46*  ALKPHOS 40  BILITOT 0.8    Recent Labs  09/06/14 0005  CHOL 192   No results for input(s): PROTIME in the last 72 hours.  Imaging: No new CXR since 1/2 - pending  Cardiac Studies:  EKG - V paced, Afib  Tele - V paced, Afib  Cath report reviewed - summary above; images personally reviewed.  Assessment/Plan:  Principal Problem:   ST elevation myocardial infarction (STEMI) of inferior wall Active Problems:   Atrial fibrillation, persistent   Complete heart block   Coronary artery disease involving native coronary artery: Multivessel CAD   PCI to mRCA with 3.5 mm x 18 mm Resolute DES & PTCA of dRCA-PLB   Encounter for management of temporary pacemaker   Hypothyroidism   Type II diabetes mellitus with complication   Hyperlipemia   Essential hypertension   Anxiety and depression  Good post PCI results from PCI to RCA - inferior WMA  On LV Gram - will await Echo to better assess EF & filling pressures.  On ASA/Brilinta & Statin.  I personally reviewed the cath films - relatively small caliber LAD with proximal lesions - for now, with no active Sx of Angina, will hold off on further PCI until discussion with EP & other IC colleagues - ? If LAD PCI may help with AV Nodal  conduction.    Will reduced IVF (stil not taking PO well) as BP more stable  Will try to wean off Dopamine gtt as BP tolerates. No affect on HR  Afib with CHB - thresholds of TPM checked.  I also reduced rate to 45 bpm with no intrinsic beats conducted ==> concerning that she has not yet restored AVN conduction after RCA PCI -- will ask EP to follow re ? Possible need for PPM - > for now, continue with TPM @ rate of 80 bpm  For now would convert to IV Heparin from Lovenox pending decision re PPM & PCI -> will need to convert to NOAC on d/c (would use 3 meds x 1 month & d/c ASA after that)  TPM site oozing -- use topical  thrombin.  Glycemic control with TID & SSI insulin - acceptable, hold off on restarting Metformin until we are sure of no further procedures.  Very thin & frail with self reported poor PO intake x several months - suspect Severe Protein malnutrition - Ensure ordered.  Have consulted Nutrition for full assessment  Keep in ICU for now.    LOS: 2 days    HARDING, DAVID W 09/07/2014, 9:04 AM

## 2014-09-08 ENCOUNTER — Inpatient Hospital Stay (HOSPITAL_COMMUNITY): Payer: Medicare Other

## 2014-09-08 DIAGNOSIS — I25110 Atherosclerotic heart disease of native coronary artery with unstable angina pectoris: Secondary | ICD-10-CM

## 2014-09-08 DIAGNOSIS — E43 Unspecified severe protein-calorie malnutrition: Secondary | ICD-10-CM | POA: Diagnosis present

## 2014-09-08 LAB — HEPARIN LEVEL (UNFRACTIONATED): Heparin Unfractionated: 0.41 IU/mL (ref 0.30–0.70)

## 2014-09-08 LAB — CBC
HEMATOCRIT: 26 % — AB (ref 36.0–46.0)
HEMOGLOBIN: 8.7 g/dL — AB (ref 12.0–15.0)
MCH: 28.3 pg (ref 26.0–34.0)
MCHC: 33.5 g/dL (ref 30.0–36.0)
MCV: 84.7 fL (ref 78.0–100.0)
Platelets: 166 10*3/uL (ref 150–400)
RBC: 3.07 MIL/uL — ABNORMAL LOW (ref 3.87–5.11)
RDW: 13.9 % (ref 11.5–15.5)
WBC: 8.5 10*3/uL (ref 4.0–10.5)

## 2014-09-08 LAB — BASIC METABOLIC PANEL
Anion gap: 6 (ref 5–15)
BUN: 36 mg/dL — ABNORMAL HIGH (ref 6–23)
CALCIUM: 8 mg/dL — AB (ref 8.4–10.5)
CO2: 20 mmol/L (ref 19–32)
CREATININE: 1.01 mg/dL (ref 0.50–1.10)
Chloride: 110 mEq/L (ref 96–112)
GFR, EST AFRICAN AMERICAN: 62 mL/min — AB (ref >90)
GFR, EST NON AFRICAN AMERICAN: 53 mL/min — AB (ref >90)
GLUCOSE: 206 mg/dL — AB (ref 70–99)
POTASSIUM: 3.5 mmol/L (ref 3.5–5.1)
Sodium: 136 mmol/L (ref 135–145)

## 2014-09-08 LAB — GLUCOSE, CAPILLARY
GLUCOSE-CAPILLARY: 143 mg/dL — AB (ref 70–99)
GLUCOSE-CAPILLARY: 174 mg/dL — AB (ref 70–99)
Glucose-Capillary: 237 mg/dL — ABNORMAL HIGH (ref 70–99)
Glucose-Capillary: 184 mg/dL — ABNORMAL HIGH (ref 70–99)

## 2014-09-08 LAB — PROTIME-INR
INR: 1.16 (ref 0.00–1.49)
Prothrombin Time: 14.9 seconds (ref 11.6–15.2)

## 2014-09-08 MED ORDER — CLOPIDOGREL BISULFATE 75 MG PO TABS
75.0000 mg | ORAL_TABLET | Freq: Every day | ORAL | Status: DC
  Administered 2014-09-09 – 2014-09-11 (×3): 75 mg via ORAL
  Filled 2014-09-08 (×3): qty 1

## 2014-09-08 MED ORDER — SODIUM CHLORIDE 0.9 % IV BOLUS (SEPSIS)
500.0000 mL | Freq: Once | INTRAVENOUS | Status: AC
  Administered 2014-09-08: 500 mL via INTRAVENOUS

## 2014-09-08 MED ORDER — APIXABAN 5 MG PO TABS
5.0000 mg | ORAL_TABLET | Freq: Two times a day (BID) | ORAL | Status: DC
  Filled 2014-09-08 (×2): qty 1

## 2014-09-08 MED ORDER — APIXABAN 2.5 MG PO TABS
2.5000 mg | ORAL_TABLET | Freq: Two times a day (BID) | ORAL | Status: DC
  Administered 2014-09-08 – 2014-09-11 (×6): 2.5 mg via ORAL
  Filled 2014-09-08 (×7): qty 1

## 2014-09-08 NOTE — Progress Notes (Signed)
[facility] Progress Note  74 y/o woman with DM-2 admitted 1/2 with Inf STEMI complicated by AFib & CHB --> PCI to mRCA 100%/PTCA of PLB & TPM placed. Was hypotensive post-PCI - started on Dopamine infusion & IVF yesterday --> continues to be TPM dependent.  Has residual CAD in LAD & Cx. EF ~40% by cath  Subjective:  Feels better - no CP or SOB; + Nausea R groin "sore" - but no hematoma - mild ooze from TPM site; had pressure held @ arterial site last pM  Objective:  Vital Signs in the last 24 hours: Temp:  [98.1 F (36.7 C)-98.5 F (36.9 C)] 98.5 F (36.9 C) (01/05 1140) Pulse Rate:  [45-80] 45 (01/05 1200) Resp:  [0-25] 25 (01/05 1200) BP: (90-138)/(43-72) 117/57 mmHg (01/05 1200) SpO2:  [96 %-100 %] 100 % (01/05 1200) Weight:  [118 lb 4.8 oz (53.661 kg)] 118 lb 4.8 oz (53.661 kg) (01/05 0500)  Intake/Output from previous day: 01/04 0701 - 01/05 0700 In: 1776.8 [P.O.:480; I.V.:1296.8] Out: 600 [Urine:600] Intake/Output from this shift: Total I/O In: 102 [I.V.:102] Out: 200 [Urine:200]  Physical Exam: General appearance: alert, cooperative, appears stated age, no distress and thin & frail appearing.   Neck: no adenopathy, no carotid bruit, supple, symmetrical, trachea midline and pulsatile JVP with mild distension.   Lungs: clear to auscultation bilaterally, normal percussion bilaterally and poor effort Heart: Reg Rate & Rhythm - paced.  ~1/6 HSM @ apex; No R/G Abdomen: soft, non-tender; bowel sounds normal; no masses,  no organomegaly Extremities: extremities normal, atraumatic, no cyanosis or edema and R groin cath site with pressure dressing - mild ooze noted. Pulses: 2+ and symmetric Neurologic: Grossly normal  Lab Results:  Recent Labs  09/06/14 0605 09/08/14 0233  WBC 15.0* 8.5  HGB 10.4* 8.7*  PLT 171 166    Recent Labs  09/07/14 0330 09/08/14 0233  NA 135 136  K 3.7 3.5  CL 108 110  CO2 20 20  GLUCOSE 203* 206*  BUN 44* 36*  CREATININE 1.12*  1.01    Recent Labs  09/06/14 0005 09/06/14 0605  TROPONINI >80.00* 66.50*   Hepatic Function Panel  Recent Labs  09/06/14 0605  PROT 4.3*  ALBUMIN 2.3*  AST 91*  ALT 46*  ALKPHOS 40  BILITOT 0.8    Recent Labs  09/06/14 0005  CHOL 192   No results for input(s): PROTIME in the last 72 hours.  Imaging: No new CXR since 1/2 - pending  Cardiac Studies:  Tele - Afib & intermittently V paced,  Cath report reviewed - summary above; images personally reviewed.  Assessment/Plan:  Principal Problem:   ST elevation myocardial infarction (STEMI) of inferior wall Active Problems:   Atrial fibrillation, persistent   Complete heart block   Coronary artery disease involving native coronary artery: Multivessel CAD   PCI to mRCA with 3.5 mm x 18 mm Resolute DES & PTCA of dRCA-PLB   Encounter for management of temporary pacemaker   Hypothyroidism   Type II diabetes mellitus with complication   Hyperlipemia   Essential hypertension   Protein-calorie malnutrition, severe   Anxiety and depression   Bradycardia  Good post PCI results from PCI to RCA - inferior WMA  On LV Gram - will await Echo to better assess EF & filling pressures.  On ASA/Brilinta & Statin. -- will convert to Plavix as she will need full AC for Afib.  I personally reviewed the cath films - relatively small caliber LAD with proximal  lesions - for now, with no active Sx of Angina.  I discussed with Dr. Tresa EndoKelly & we both agree to hold off on further PCI & treat residual disease with Medical Therapy.    Advance diet; IVF bolus today for decreased UOP>  Off of pressors.   Afib with CHB - thresholds of TPM checked. -- TPM turned off & continue to have Afib with rates from 55-90 bpm.  --> OK to d/c TPM today  Will start Eliquis (low dose - Per Pharmacy due to protein malnutrition)   Glycemic control with TID & SSI insulin - acceptable, hold off on restarting Metformin until we are sure of no further  procedures.  Very thin & frail with self reported poor PO intake x several months - Severe Protein malnutrition - Ensure ordered.  Have consulted Nutrition for full assessment. Advance diet  Keep in ICU for now - but anticipate transfer to Tele in AM & d/c as early as Thursday 1/7.    LOS: 3 days    Niki Payment W 09/08/2014, 2:34 PM

## 2014-09-08 NOTE — Progress Notes (Signed)
ANTICOAGULATION CONSULT NOTE - Follow Up Consult  Pharmacy Consult for Heparin Indication: atrial fibrillation  Allergies  Allergen Reactions  . Atorvastatin Other (See Comments)    Muscle aches  . Hydrochlorothiazide W-Triamterene     REACTION: palpitations  . Penicillins     REACTION: urticaria (hives)  . Statins     Subjective myalgia    Patient Measurements: Height: 5\' 3"  (160 cm) Weight: 112 lb 9.6 oz (51.075 kg) IBW/kg (Calculated) : 52.4 Heparin Dosing Weight: 51kg  Vital Signs: Temp: 98.4 F (36.9 C) (01/05 0352) Temp Source: Oral (01/05 0352) BP: 90/63 mmHg (01/05 0300) Pulse Rate: 60 (01/05 0300)  Labs:  Recent Labs  09/05/14 1735 09/05/14 1952 09/06/14 0005 09/06/14 0605 09/07/14 0330 09/07/14 1752 09/08/14 0233  HGB 13.9  12.1  --   --  10.4*  --   --  8.7*  HCT 41.0  35.8*  --   --  30.3*  --   --  26.0*  PLT 194  --   --  171  --   --  166  APTT >200*  --   --   --   --   --   --   LABPROT 63.9* 38.3*  --   --  15.5*  --  14.9  INR 7.45* 3.88*  --   --  1.21  --  1.16  HEPARINUNFRC  --   --   --   --   --  0.48 0.41  CREATININE 1.70*  1.66*  --   --  1.16* 1.12*  --  1.01  CKTOTAL 711*  --   --   --   --   --   --   CKMB 26.1*  --   --   --   --   --   --   TROPONINI 68.12*  --  >80.00* 66.50*  --   --   --     Estimated Creatinine Clearance: 38.8 mL/min (by C-G formula based on Cr of 1.01).  Assessment: 73yof initially started on full dose lovenox for her afib but was transitioned to IV heparin as she may need a permanent pacemaker and/or PCI to her LAD. Confirmatory heparin level remains therapeutic at 0.41 on heparin 700 units/hr. Nurse reports that bleeding at temporary pacemaker site has resolved. Hgb dropped to 8.7. Plt wnl.   Goal of Therapy:  Heparin level 0.3-0.7 units/ml Monitor platelets by anticoagulation protocol: Yes   Plan:  - Continue heparin at 700 units/hr - Daily heparin level and CBC - Follow up plans for  procedures  Vinnie LevelBenjamin Tyrihanna Wingert, PharmD., BCPS Clinical Pharmacist Pager [phone]

## 2014-09-08 NOTE — Progress Notes (Addendum)
Pharmacy: Heparin-->Eliquis, Brilinta-->Plavix  73yof on IV heparin for afib now to transition to eliquis since she will not need any procedures (ie permanent pacemaker or PCI to LAD). Dr. Herbie BaltimoreHarding would like low dose 2.5mg  bid since she is frail and malnourished.  She also has a DES to her midRCA and to her proximal PLA. Pharmacy also asked to switch brilinta to plavix to decrease bleeding risk since she will be on triple drug therapy with aspirin and eliquis. Her last dose of brilinta was this morning at 0919.  Plan: 1) Eliquis 2.5mg  bid 2) Will initiate eliquis education  3) Plavix 75mg  daily - start 1/6  Louie CasaJennifer Rohith Fauth, PharmD, BCPS 09/08/2014, 2:31 PM

## 2014-09-08 NOTE — Progress Notes (Addendum)
Notified in report that patient's right groin site had some leaking at insertion site and MD aware. Bleeding became worse and determined that bleeding was due to arterial sheath site no longer clotted. Pressure held at arterial site on right groin from 2300 to 2338, gauze and transparent dressing applied. Bleeding stopped. Will continue to monitor.

## 2014-09-09 DIAGNOSIS — D62 Acute posthemorrhagic anemia: Secondary | ICD-10-CM | POA: Diagnosis not present

## 2014-09-09 LAB — BASIC METABOLIC PANEL
ANION GAP: 8 (ref 5–15)
BUN: 30 mg/dL — ABNORMAL HIGH (ref 6–23)
CALCIUM: 8.1 mg/dL — AB (ref 8.4–10.5)
CO2: 17 mmol/L — AB (ref 19–32)
Chloride: 110 mEq/L (ref 96–112)
Creatinine, Ser: 0.95 mg/dL (ref 0.50–1.10)
GFR calc Af Amer: 66 mL/min — ABNORMAL LOW (ref >90)
GFR calc non Af Amer: 57 mL/min — ABNORMAL LOW (ref >90)
Glucose, Bld: 259 mg/dL — ABNORMAL HIGH (ref 70–99)
POTASSIUM: 3.8 mmol/L (ref 3.5–5.1)
SODIUM: 135 mmol/L (ref 135–145)

## 2014-09-09 LAB — CBC
HEMATOCRIT: 25.9 % — AB (ref 36.0–46.0)
HEMOGLOBIN: 8.9 g/dL — AB (ref 12.0–15.0)
MCH: 30.2 pg (ref 26.0–34.0)
MCHC: 34.4 g/dL (ref 30.0–36.0)
MCV: 87.8 fL (ref 78.0–100.0)
Platelets: 172 10*3/uL (ref 150–400)
RBC: 2.95 MIL/uL — ABNORMAL LOW (ref 3.87–5.11)
RDW: 14.4 % (ref 11.5–15.5)
WBC: 8.9 10*3/uL (ref 4.0–10.5)

## 2014-09-09 LAB — GLUCOSE, CAPILLARY
Glucose-Capillary: 173 mg/dL — ABNORMAL HIGH (ref 70–99)
Glucose-Capillary: 350 mg/dL — ABNORMAL HIGH (ref 70–99)
Glucose-Capillary: 238 mg/dL — ABNORMAL HIGH (ref 70–99)
Glucose-Capillary: 164 mg/dL — ABNORMAL HIGH (ref 70–99)

## 2014-09-09 LAB — PROTIME-INR
INR: 1.14 (ref 0.00–1.49)
Prothrombin Time: 14.7 seconds (ref 11.6–15.2)

## 2014-09-09 MED ORDER — METFORMIN HCL 500 MG PO TABS
500.0000 mg | ORAL_TABLET | Freq: Two times a day (BID) | ORAL | Status: DC
  Administered 2014-09-09 – 2014-09-11 (×3): 500 mg via ORAL
  Filled 2014-09-09 (×6): qty 1

## 2014-09-09 MED ORDER — RAMIPRIL 2.5 MG PO CAPS
2.5000 mg | ORAL_CAPSULE | Freq: Every day | ORAL | Status: DC
  Administered 2014-09-09 – 2014-09-11 (×3): 2.5 mg via ORAL
  Filled 2014-09-09 (×3): qty 1

## 2014-09-09 NOTE — Progress Notes (Addendum)
Patient ID: [name], female   DOB: [birth year], 74 y.o.   MRN: [id number]  74 y/o woman with DM-2 admitted 1/2 with Inf STEMI complicated by AFib & CHB --> PCI to mRCA 100%/PTCA of PLB & TPM placed. Was hypotensive post-PCI - started on Dopamine infusion & IVF yesterday --> continues to be TPM dependent.  Has residual CAD in LAD & Cx. EF ~40% by cath --> EF by echo 55-60% with moderate HK of the basal inferior myocardium.  Subjective:  Feels better - no CP or SOB; nausea improved Groin site stable  Objective:  Vital Signs in the last 24 hours: Temp:  [98.2 F (36.8 C)-98.8 F (37.1 C)] 98.3 F (36.8 C) (01/06 1236) Pulse Rate:  [44-102] 75 (01/06 1236) Resp:  [12-27] 24 (01/06 1100) BP: (109-176)/(44-110) 135/76 mmHg (01/06 1100) SpO2:  [98 %-100 %] 98 % (01/06 1100) Weight:  [114 lb 12.8 oz (52.073 kg)] 114 lb 12.8 oz (52.073 kg) (01/06 0500)  Intake/Output from previous day: 01/05 0701 - 01/06 0700 In: 412 [P.O.:240; I.V.:172] Out: 575 [Urine:575] Intake/Output from this shift:    Physical Exam: General appearance: alert, cooperative, appears stated age, no distress and thin & frail appearing.   Neck: no adenopathy, no carotid bruit, supple, symmetrical, trachea midline and pulsatile JVP with mild distension.   Lungs: clear to auscultation bilaterally, normal percussion bilaterally and poor effort Heart: Irregularly irregular rhythm. Rate controlled  ~1/6 HSM @ apex; No R/G Abdomen: soft, non-tender; bowel sounds normal; no masses,  no organomegaly Extremities: extremities normal, atraumatic, no cyanosis or edema and R groin cath site with pressure dressing - mild ooze noted. Pulses: 2+ and symmetric; right groin more stable. No hematoma noted. No oozing. Neurologic: Grossly normal  Lab Results:  Recent Labs  09/08/14 0233 09/09/14 0235  WBC 8.5 8.9  HGB 8.7* 8.9*  PLT 166 172    Recent Labs  09/08/14 0233 09/09/14 0235  NA 136 135  K 3.5 3.8  CL 110  110  CO2 20 17*  GLUCOSE 206* 259*  BUN 36* 30*  CREATININE 1.01 0.95    Hepatic Function Panel No results for input(s): PROT, ALBUMIN, AST, ALT, ALKPHOS, BILITOT, BILIDIR, IBILI in the last 72 hours. No results for input(s): CHOL in the last 72 hours. No results for input(s): PROTIME in the last 72 hours.  Imaging: No new CXR since 1/2 - pending  Cardiac Studies:  Tele - Afib /flutter with variable block -- rates from 50s to 90s  Cath report reviewed - summary above; images personally reviewed.  Assessment/Plan:  Principal Problem:   ST elevation myocardial infarction (STEMI) of inferior wall Active Problems:   Atrial fibrillation, persistent   Complete heart block   Coronary artery disease involving native coronary artery: Multivessel CAD   PCI to mRCA with 3.5 mm x 18 mm Resolute DES & PTCA of dRCA-PLB   Encounter for management of temporary pacemaker   Hypothyroidism   Type II diabetes mellitus with complication   Hyperlipemia   Essential hypertension   Protein-calorie malnutrition, severe   Anemia due to acute blood loss   Anxiety and depression   Bradycardia  Good post PCI results from PCI to RCA - inferior WMA  On LV Gram - notable improvement on echo EF   On ASA - with now Plavix   Statin.  Plan is medical therapy for small vessel LAD disease  Advance diet; IVF bolus today for decreased UOP>  Off of pressors. Ready  to ambulate today as she now no longer has a femoral sheath  Will start low-dose ramipril (on 10 twice a day at home) we'll start 2.5 mg twice a day today as her blood pressures are increasing and renal function is stable.  Hemoglobin level is stable after sheath removal. I would anticipate that with several days of using she has lost at least a unit of blood.  Would not transfuse unless it continues to drop.  Consider iron replacement therapy on discharge   Afib with CHB - heart block resolved. Remaining intrinsic rhythm without temporary  wire.   Anticoagulation with ELIQUIS low dose based on poor nutrition.  I counseled her currently on the concern with alcohol abuse in the setting of triple therapy. He specifically asked about her "drink beer, which I responded well   Still reluctant to restart beta blocker based on her recent complete block. He did determine prior to discharge with a redo start her on low-dose beta blocker for rate control and CAD.  Glycemic control with TID & SSI insulin - acceptable, hold off on restarting Metformin until we are sure of no further procedures.  Very thin & frail with self reported poor PO intake x several months - Severe Protein malnutrition - Ensure ordered.  Have consulted Nutrition for full assessment. Advance diet  Transfer to telemetry today. Anticipate discharge within 1-2 days depending on her stability for discharge. She did walk around the unit with cardiac rehabilitation. Will await further assessment after working with her another day to see how strong she is for potential need for home health assistance.  Initially side notice that her [facility] is pending [facility]    LOS: 4 days    HARDING, DAVID W 09/09/2014, 2:38 PM

## 2014-09-09 NOTE — Progress Notes (Signed)
Patient's urinary foley was removed at 0400 without difficulty. Deflated the internal balloon prior to removal with 100 ml of clear fluid present. Instructed patient to hold breath during removal , pt did as instructed. Discussed with patient the need to void in 8 hours . Patient verbalized understanding. The patient was then bathed, perineal area cleaned. See doc flowsheet.

## 2014-09-09 NOTE — Progress Notes (Signed)
CARDIAC REHAB PHASE I   PRE:  Rate/Rhythm: 71 Aflutter  BP:  Supine: 150/48  Sitting:   Standing:    SaO2: 100 RA  MODE:  Ambulation: 170 ft   POST:  Rate/Rhythm: 109 Aflutter  BP:  Supine:   Sitting: 135/76  Standing:    SaO2: 100 RA 1035-1135 On arrival pt in bed. Assisted X 1 to get out of bed, pt c/o weakness in her legs. Obtained walker to assist with walking.Pt states that it felt good to have the walker to steady her. Pt does not use a cane or walker at home. She was able to walk 170 feet and c/o of feeling weak so returned to room. No c/o of cp or SOB walking. Pt to recliner after walk with call light in reach. Started MI and stent education with pt. She voices understanding. Pt agrees to Outpt. CRP in [place], will send referral. We discussed smoking cessation, pt states that she quit 3 weeks ago, "cold [place]." She plans to not smoke anymore. I gave her tips for quitting, coaching contact number and quit smart class information. We will continue to follow pt.   Melina CopaLisa Hadar Elgersma RN 09/09/2014 11:34 AM

## 2014-09-10 LAB — GLUCOSE, CAPILLARY
Glucose-Capillary: 185 mg/dL — ABNORMAL HIGH (ref 70–99)
Glucose-Capillary: 142 mg/dL — ABNORMAL HIGH (ref 70–99)
Glucose-Capillary: 112 mg/dL — ABNORMAL HIGH (ref 70–99)
Glucose-Capillary: 160 mg/dL — ABNORMAL HIGH (ref 70–99)

## 2014-09-10 LAB — CBC
HCT: 23 % — ABNORMAL LOW (ref 36.0–46.0)
HEMOGLOBIN: 7.9 g/dL — AB (ref 12.0–15.0)
MCH: 29.4 pg (ref 26.0–34.0)
MCHC: 34.3 g/dL (ref 30.0–36.0)
MCV: 85.5 fL (ref 78.0–100.0)
Platelets: 189 10*3/uL (ref 150–400)
RBC: 2.69 MIL/uL — ABNORMAL LOW (ref 3.87–5.11)
RDW: 14.3 % (ref 11.5–15.5)
WBC: 8.6 10*3/uL (ref 4.0–10.5)

## 2014-09-10 LAB — PROTIME-INR
INR: 1.3 (ref 0.00–1.49)
Prothrombin Time: 16.3 seconds — ABNORMAL HIGH (ref 11.6–15.2)

## 2014-09-10 NOTE — Discharge Instructions (Signed)

## 2014-09-10 NOTE — Consult Note (Signed)
Met with the patient at bedside to offer [facility] Management services as benefit of [employer] insurance. Patient agreeable to services and will receive post hospital discharge call and will be evaluated for monthly home visits. Patient states she is planning to stay with her daughter, Nicole, in which she is currently caring for her husband as well.  Husband recently was discharged from [facility].   Of note, [facility] Care Management services does not replace or interfere with any services that are arranged by inpatient case management or social work. For additional questions or referrals please contact, Victoria Brewer, RN BSN CCM, [facility], Care Management Hospital Liaison at [phone]. 

## 2014-09-10 NOTE — Progress Notes (Signed)
Patient Name: [name] Date of Encounter: 09/10/2014  Primary Cardiologist: Dr. Antoine PocheHochrein   Principal Problem:   ST elevation myocardial infarction (STEMI) of inferior wall Active Problems:   Hypothyroidism   Type II diabetes mellitus with complication   Hyperlipemia   Anxiety and depression   Essential hypertension   Atrial fibrillation, persistent   Complete heart block   Coronary artery disease involving native coronary artery: Multivessel CAD   PCI to mRCA with 3.5 mm x 18 mm Resolute DES & PTCA of dRCA-PLB   Encounter for management of temporary pacemaker   Bradycardia   Protein-calorie malnutrition, severe   Anemia due to acute blood loss    SUBJECTIVE  Denies any CP or SOB. States she had PAF since 2006, denies any cardiac awareness  CURRENT MEDS . antiseptic oral rinse  7 mL Mouth Rinse BID  . apixaban  2.5 mg Oral BID  . aspirin EC  81 mg Oral Daily  . clopidogrel  75 mg Oral Daily  . feeding supplement (RESOURCE BREEZE)  1 Container Oral TID BM  . insulin aspart  0-15 Units Subcutaneous TID WC  . insulin aspart  0-5 Units Subcutaneous QHS  . levothyroxine  50 mcg Oral QAC breakfast  . metFORMIN  500 mg Oral BID WC  . PARoxetine  20 mg Oral Daily  . polyethylene glycol  17 g Oral Daily  . pravastatin  80 mg Oral q1800  . ramipril  2.5 mg Oral Daily    OBJECTIVE  Filed Vitals:   09/09/14 1500 09/09/14 2139 09/10/14 0142 09/10/14 0605  BP: 116/69 125/74 124/74 129/84  Pulse: 72 84 73 80  Temp: 98.1 F (36.7 C) 98.1 F (36.7 C) 98.9 F (37.2 C) 98.6 F (37 C)  TempSrc: Oral Oral Oral Oral  Resp: 20 18 16 18   Height:      Weight:    118 lb 4.8 oz (53.661 kg)  SpO2: 100% 100% 100% 100%    Intake/Output Summary (Last 24 hours) at 09/10/14 0745 Last data filed at 09/10/14 [id number]  Gross per 24 hour  Intake    462 ml  Output    403 ml  Net     59 ml   Filed Weights   09/08/14 0500 09/09/14 0500 09/10/14 0605  Weight: 118 lb 4.8 oz (53.661  kg) 114 lb 12.8 oz (52.073 kg) 118 lb 4.8 oz (53.661 kg)    PHYSICAL EXAM  General: Pleasant, NAD. Neuro: Alert and oriented X 3. Moves all extremities spontaneously. Psych: flat effect HEENT:  Normal  Neck: Supple without bruits or JVD. Lungs:  Resp regular and unlabored, CTA. Heart: irregular no s3, s4, or murmurs. Abdomen: Soft, non-tender, non-distended, BS + x 4.  Extremities: No clubbing, cyanosis or edema. DP/PT/Radials 2+ and equal bilaterally.  Accessory Clinical Findings  CBC  Recent Labs  09/09/14 0235 09/10/14 0533  WBC 8.9 8.6  HGB 8.9* 7.9*  HCT 25.9* 23.0*  MCV 87.8 85.5  PLT 172 189   Basic Metabolic Panel  Recent Labs  09/08/14 0233 09/09/14 0235  NA 136 135  K 3.5 3.8  CL 110 110  CO2 20 17*  GLUCOSE 206* 259*  BUN 36* 30*  CREATININE 1.01 0.95  CALCIUM 8.0* 8.1*    TELE A-fib with HR 70-90s    ECG  No new EKG  Echocardiogram 09/07/2014  Left ventricle: The cavity size was normal. Wall thickness was increased in a pattern of severe LVH. Systolic function was  normal. The estimated ejection fraction was in the range of 55% to 60%. There is moderate hypokinesis of the basalinferior myocardium. Doppler parameters are consistent with high ventricular filling pressure. - Mitral valve: There was mild regurgitation. - Pulmonary arteries: PA peak pressure: 33 mm Hg (S).    Radiology/Studies  Dg Chest Port 1 View  09/08/2014   CLINICAL DATA:  Temporary pacer.  EXAM: PORTABLE CHEST - 1 VIEW  COMPARISON:  09/07/2014.  FINDINGS: Mediastinum hilar structures are normal. Tapering pacer noted projected over right ventricle. Stable cardiomegaly. Pulmonary vascularity normal. Low lung volumes with mild basilar atelectasis. No pleural effusion or pneumothorax.  IMPRESSION: 1. Temporary pacer noted in stable position projected over the right ventricle. Stable cardiomegaly. No CHF. 2. Low lung volumes with mild basilar atelectasis .    Electronically Signed   By: Maisie Fus  Register   On: 09/08/2014 07:28   Dg Chest Port 1 View  09/07/2014   CLINICAL DATA:  Left posterior chest discomfort. Temporary pacemaker.  EXAM: PORTABLE CHEST - 1 VIEW  COMPARISON:  09/05/2014  FINDINGS: Temporary pacing lead projects in the right ventricle, unchanged.  Cardiac silhouette is mildly enlarged. No mediastinal or hilar masses or evidence of adenopathy.  Clear lungs.  No pleural effusion or pneumothorax.  Bony thorax is intact.  IMPRESSION: 1. No acute cardiopulmonary disease. No change from the prior study.   Electronically Signed   By: Amie Portland M.D.   On: 09/07/2014 09:40   Dg Chest Portable 1 View  09/05/2014   CLINICAL DATA:  Acute myocardial infarction.  EXAM: PORTABLE CHEST - 1 VIEW  COMPARISON:  04/19/2013  FINDINGS: Heart size and pulmonary vascularity are within normal limits in the lungs are clear. No acute osseous abnormality. Slight tortuosity and calcification of the thoracic aorta. Temporary pacing lead in place.  IMPRESSION: Temporary pacing lead in place.  No acute abnormalities.   Electronically Signed   By: Geanie Cooley M.D.   On: 09/05/2014 19:01    ASSESSMENT AND PLAN  74 y/o woman with DM-2 admitted 1/2 with Inf STEMI complicated by AFib & CHB --> PCI to mRCA 100%/PTCA of PLB & TPM placed. Was hypotensive post-PCI - started on Dopamine infusion & IVF, dopamine wean off. Has residual CAD in LAD & Cx. EF ~40% by cath --> EF by echo 55-60% with moderate HK of the basal inferior myocardium.  1. Inferior STEMI  - continue ASA, plavix, ramipril  - medical therapy for small vessel LAD dx.  - monitor 1 more day, check CBC to make sure hgb stable (currently 7.9), cardiac rehab to walk pt today, expect discharge tomorrow AM with TOC followup (will hold off adding low dose BB given recent complete heart block, may add tomorrow).    2. Afib with complete heart block  - although admit EKG was read as sinus brady, however no obvious P  wave noted, likely was in a-fib prior to admission  - noted to have bradycardia of 36 with probably a-fib/a-flutter during cath treated with temp pacer and temp dopamine  - CHB resolved, on low dose eliquis based on poor nutrition  - conseled on EtOH cessation on triple therapy  - potentially start on low dose BB prior to discharge  3. CAD  - nonobstructive CAD by cath 2006  - Cath 09/05/2014 EF 45%, 100% occluded mid RCA treated with 3.5x2mm DES postdilated to 3.59mm, PTCA of 95% prox PLA, 70% prox LAD, 60% mid-distal D1, 50% mid LAD, 90% D2, 70% mid  LCx, OM2 occluded.  - Echo 1/4 EF 55-60%, moderate hypokinesis of basal inferior, mild MR, PA peak pressure .  4. DM 5. HLD 6. HTN 7. Anemia: 7.9, blood loss via R groin sheath 8. Hypothyroidism 9. Severe protein malnutrition  Signed, Azalee Course PA-C Pager: [phone] As above, patient seen and examined. She denies chest pain or dyspnea. Plan to continue aspirin, Plavix and statin following recent infarct/PCI and residual coronary disease. No beta blocker given recent bradycardia. She remains in atrial fibrillation. Continue apixaban. When she follows up with Dr. Antoine Poche would potentially increase dose to 5 mg twice a day if renal function and hemoglobin stable. Acute blood loss anemia-Patient does not appear to be actively bleeding. Repeat hemoglobin tomorrow. Hopefully can discharge tomorrow with outpatient follow-up. Patient counseled on avoiding alcohol. [name]

## 2014-09-10 NOTE — Progress Notes (Signed)
CARDIAC REHAB PHASE I   PRE:  Rate/Rhythm: 88 Aflutter  BP:  Supine: 110/64  Sitting:   Standing:    SaO2: 100 RA  MODE:  Ambulation:320 ft   POST:  Rate/Rhythm: 104 Aflutter  BP:  Supine:   Sitting: 120/70  Standing:    SaO2: 100 RA 1100-1125 Assisted X 1 and used walker to ambulate. Gait steady with walker. Pt states that she is feeling stronger today. She was able to 320 feet without c/o of cp or SOB. VS stable Pt to recliner after walk.with call light in reach.telephone rang and I was unable to do any more education with pt. We will follow pt in the am to complete education.  Melina CopaLisa Ianna Salmela RN 09/10/2014 11:28 AM

## 2014-09-11 ENCOUNTER — Ambulatory Visit: Payer: Medicare Other | Admitting: Family Medicine

## 2014-09-11 LAB — GLUCOSE, CAPILLARY
GLUCOSE-CAPILLARY: 148 mg/dL — AB (ref 70–99)
Glucose-Capillary: 129 mg/dL — ABNORMAL HIGH (ref 70–99)

## 2014-09-11 LAB — PROTIME-INR
INR: 1.3 (ref 0.00–1.49)
PROTHROMBIN TIME: 16.4 s — AB (ref 11.6–15.2)

## 2014-09-11 LAB — BASIC METABOLIC PANEL
Anion gap: 9 (ref 5–15)
BUN: 12 mg/dL (ref 6–23)
CHLORIDE: 109 meq/L (ref 96–112)
CO2: 18 mmol/L — ABNORMAL LOW (ref 19–32)
Calcium: 8.3 mg/dL — ABNORMAL LOW (ref 8.4–10.5)
Creatinine, Ser: 0.79 mg/dL (ref 0.50–1.10)
GFR calc Af Amer: >90 mL/min (ref >90)
GFR calc non Af Amer: 79 mL/min — ABNORMAL LOW (ref >90)
Glucose, Bld: 160 mg/dL — ABNORMAL HIGH (ref 70–99)
Potassium: 3.4 mmol/L — ABNORMAL LOW (ref 3.5–5.1)
Sodium: 136 mmol/L (ref 135–145)

## 2014-09-11 LAB — CBC
HCT: 23.9 % — ABNORMAL LOW (ref 36.0–46.0)
HEMOGLOBIN: 8 g/dL — AB (ref 12.0–15.0)
MCH: 28.7 pg (ref 26.0–34.0)
MCHC: 33.5 g/dL (ref 30.0–36.0)
MCV: 85.7 fL (ref 78.0–100.0)
PLATELETS: 216 10*3/uL (ref 150–400)
RBC: 2.79 MIL/uL — ABNORMAL LOW (ref 3.87–5.11)
RDW: 14.1 % (ref 11.5–15.5)
WBC: 8.8 10*3/uL (ref 4.0–10.5)

## 2014-09-11 MED ORDER — PRAVASTATIN SODIUM 80 MG PO TABS: 80.0000 mg | ORAL_TABLET | Freq: Every day | ORAL | Status: AC

## 2014-09-11 MED ORDER — ASPIRIN 81 MG PO TBEC: 81.0000 mg | DELAYED_RELEASE_TABLET | Freq: Every day | ORAL | Status: AC

## 2014-09-11 MED ORDER — APIXABAN 2.5 MG PO TABS: 2.5000 mg | ORAL_TABLET | Freq: Two times a day (BID) | ORAL | Status: AC

## 2014-09-11 MED ORDER — RAMIPRIL 2.5 MG PO CAPS: 2.5000 mg | ORAL_CAPSULE | Freq: Every day | ORAL | Status: AC

## 2014-09-11 MED ORDER — POTASSIUM CHLORIDE CRYS ER 20 MEQ PO TBCR
40.0000 meq | EXTENDED_RELEASE_TABLET | Freq: Once | ORAL | Status: AC
  Administered 2014-09-11: 40 meq via ORAL
  Filled 2014-09-11: qty 2

## 2014-09-11 MED ORDER — BOOST / RESOURCE BREEZE PO LIQD: 1.0000 | Freq: Two times a day (BID) | ORAL | Status: AC

## 2014-09-11 MED ORDER — NITROGLYCERIN 0.4 MG SL SUBL: 0.4000 mg | SUBLINGUAL_TABLET | SUBLINGUAL | Status: AC | PRN

## 2014-09-11 MED ORDER — CLOPIDOGREL BISULFATE 75 MG PO TABS: 75.0000 mg | ORAL_TABLET | Freq: Every day | ORAL | Status: AC

## 2014-09-11 NOTE — Progress Notes (Signed)
Subjective: No CP, SOB, dizziness.  She reports feeling stronger and is able to walk on her own.   Objective: Vital signs in last 24 hours: Temp:  [98.1 F (36.7 C)-98.9 F (37.2 C)] 98.9 F (37.2 C) (01/08 0538) Pulse Rate:  [71-80] 75 (01/08 0538) Resp:  [16-18] 16 (01/08 0538) BP: (121-142)/(57-89) 132/75 mmHg (01/08 0538) SpO2:  [99 %-100 %] 99 % (01/08 0538) Weight:  [119 lb 8 oz (54.205 kg)] 119 lb 8 oz (54.205 kg) (01/08 0538) Last BM Date: 09/09/14  Intake/Output from previous day: 01/07 0701 - 01/08 0700 In: 1420 [P.O.:1420] Out: 475 [Urine:475] Intake/Output this shift:    Medications Current Facility-Administered Medications  Medication Dose Route Frequency Provider Last Rate Last Dose  . 0.9 %  sodium chloride infusion   Intravenous Continuous Rollene Rotunda, MD 10 mL/hr at 09/08/14 1900    . acetaminophen (TYLENOL) tablet 650 mg  650 mg Oral Q4H PRN Rhonda G Barrett, PA-C      . acetaminophen (TYLENOL) tablet 650 mg  650 mg Oral Q4H PRN Lennette Bihari, MD   650 mg at 09/08/14 0602  . ALPRAZolam Prudy Feeler) tablet 0.25 mg  0.25 mg Oral BID PRN Joline Salt Barrett, PA-C   0.25 mg at 09/07/14 1123  . antiseptic oral rinse (CPC / CETYLPYRIDINIUM CHLORIDE 0.05%) solution 7 mL  7 mL Mouth Rinse BID Rollene Rotunda, MD   7 mL at 09/10/14 2200  . apixaban (ELIQUIS) tablet 2.5 mg  2.5 mg Oral BID Marykay Lex, MD   2.5 mg at 09/10/14 2057  . aspirin EC tablet 81 mg  81 mg Oral Daily Lennette Bihari, MD   81 mg at 09/10/14 1019  . clopidogrel (PLAVIX) tablet 75 mg  75 mg Oral Daily Marykay Lex, MD   75 mg at 09/10/14 1019  . feeding supplement (RESOURCE BREEZE) (RESOURCE BREEZE) liquid 1 Container  1 Container Oral TID BM Heather Cornelison Pitts, RD   1 Container at 09/10/14 1400  . insulin aspart (novoLOG) injection 0-15 Units  0-15 Units Subcutaneous TID WC Joline Salt Barrett, PA-C   2 Units at 09/11/14 [phone]  . insulin aspart (novoLOG) injection 0-5 Units  0-5 Units  Subcutaneous QHS Darrol Jump, PA-C   2 Units at 09/07/14 2212  . levothyroxine (SYNTHROID, LEVOTHROID) tablet 50 mcg  50 mcg Oral QAC breakfast Joline Salt Barrett, PA-C   50 mcg at 09/10/14 1019  . metFORMIN (GLUCOPHAGE) tablet 500 mg  500 mg Oral BID WC Marykay Lex, MD   500 mg at 09/10/14 0830  . nitroGLYCERIN (NITROSTAT) SL tablet 0.4 mg  0.4 mg Sublingual Q5 Min x 3 PRN Rhonda G Barrett, PA-C      . ondansetron (ZOFRAN) injection 4 mg  4 mg Intravenous Q6H PRN Joline Salt Barrett, PA-C   4 mg at 09/05/14 2301  . ondansetron (ZOFRAN) injection 4 mg  4 mg Intravenous Q6H PRN Lennette Bihari, MD      . PARoxetine (PAXIL) tablet 20 mg  20 mg Oral Daily Rhonda G Barrett, PA-C   20 mg at 09/10/14 1019  . polyethylene glycol (MIRALAX / GLYCOLAX) packet 17 g  17 g Oral Daily Rhonda G Barrett, PA-C   17 g at 09/10/14 1020  . pravastatin (PRAVACHOL) tablet 80 mg  80 mg Oral q1800 Rollene Rotunda, MD   80 mg at 09/10/14 1830  . ramipril (ALTACE) capsule 2.5 mg  2.5 mg Oral Daily Marykay Lex,  MD   2.5 mg at 09/10/14 1020  . zolpidem (AMBIEN) tablet 5 mg  5 mg Oral QHS PRN Darrol Jumphonda G Barrett, PA-C        PE: General appearance: alert, cooperative and no distress Lungs: clear to auscultation bilaterally Heart: irregularly irregular rhythm and No MM Extremities: No LEE Pulses: 2+ and symmetric Skin: Warm and dry Neurologic: Grossly normal  Lab Results:   Recent Labs  09/09/14 0235 09/10/14 0533 09/11/14 0521  WBC 8.9 8.6 8.8  HGB 8.9* 7.9* 8.0*  HCT 25.9* 23.0* 23.9*  PLT 172 189 216   BMET  Recent Labs  09/09/14 0235 09/11/14 0521  NA 135 136  K 3.8 3.4*  CL 110 109  CO2 17* 18*  GLUCOSE 259* 160*  BUN 30* 12  CREATININE 0.95 0.79  CALCIUM 8.1* 8.3*   PT/INR  Recent Labs  09/09/14 0235 09/10/14 0533 09/11/14 0521  LABPROT 14.7 16.3* 16.4*  INR 1.14 1.30 1.30     Assessment/Plan  74 y/o woman with DM-2 admitted 1/2 with Inf STEMI complicated by AFib & CHB -->  PCI to mRCA 100%/PTCA of PLB & TPM placed. Was hypotensive post-PCI - started on Dopamine infusion & IVF, dopamine wean off. Has residual CAD in LAD & Cx. EF ~40% by cath --> EF by echo 55-60% with moderate HK of the basal inferior myocardium.   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 Inferior STEMI - continue ASA, plavix, ramipril - medical therapy for small vessel LAD dx.  She ambulated 320 feet with cardiac rehab yesterday.  Report was that she was steady and felt stronger.       EF 55-60% by echo. moderate hypokinesis of basal inferior, mild MR, PA peak pressure 33mmHg   Feels stronger.  Ambulating without difficulty.    2. Afib with complete heart block - although admit EKG was read as sinus brady, however no obvious P wave noted, likely was in a-fib prior to  admission - noted to have bradycardia of 36 with probably a-fib/a-flutter during cath treated with temp pacer and temp  dopamine - CHB resolved.  - She is in aflutter.  Rate controlled with some bradycardia while asleep. Asymptomatic. On eliquis 2.5 - She has been counseled on EtOH cessation on triple therapy - No BB   3. CAD - nonobstructive CAD by cath 2006 - Cath 09/05/2014 EF 45%, 100% occluded mid RCA treated with 3.5x9218mm DES postdilated to 3.7475mm, PTCA of  95% prox PLA, 70% prox LAD,  60% mid-distal D1, 50% mid LAD, 90% D2, 70% mid LCx, OM2 occluded.  4. DM  metformin 5. HLD  On statin 6. HTN  Stable and controlled 7. Anemia: 7.9, blood loss via R groin sheath  Stable.  Groin looks good.  No hematoma.  Follow up CBC next week.   8. Hypothyroidism 9. Severe protein malnutrition   DC home with daughter.     LOS: 6 days  HAGER, BRYAN PA-C 09/11/2014 9:17 AM  As above; pt seen and examined; patient denies chest pain. Hemoglobin unchanged. Plan to discharge home today on present medications including aspirin, Plavix, statin and ACE inhibitor. No beta blocker as she is occasionally bradycardic. She remains in atrial fibrillation. She will be discharged on apixaban 2.5 BID; she will need follow-up laboratories including hemoglobin. If hemoglobin remains stable would increase dose to 5 mg twice a day at that time. She should follow-up with Dr. Antoine Poche in 1-2 weeks Olga Millers

## 2014-09-11 NOTE — Progress Notes (Signed)
1230 Dressing to R groin removed with hardened site on palpation , slight tenderness . No bleeding noted. Pulses strongly palpable.[name]. Made Hager aware PA .Had seen and evaluated the pt . .Ok'd to be discharged

## 2014-09-11 NOTE — Progress Notes (Addendum)
[id number] Cardiac Rehab Pt states that she walked yesterday afternoon on her without using the walker. She said that she felt steady without it and had no cp or SOB. Completed MI and stent education with pt. I stressed the importance of medication compliance. She admits to at times not taking some of her medications. I explained what could happen if she did not take Plavix and ASA. We discussed exercise guidelines, proper use of sl NTG , when to call MD and 911. Pt is most concerned about weight loss and no appetite. She states that food just does not taste good and she does not want to eat, but she also realizes that she has to. Pt does seem dedicated  to taking her medication and taking better care of herself. She realizes that she has been caring for her husband and her son and not herself. I have referred her to [place] [facility]. CRP. Beatrix FettersHughes, Ercell Razon G, RN 09/11/2014 9:24 AM

## 2014-09-11 NOTE — Progress Notes (Signed)
ANTICOAGULATION CONSULT NOTE - Follow Up Consult  Pharmacy Consult for apixaban Indication: atrial fibrillation  Allergies  Allergen Reactions  . Atorvastatin Other (See Comments)    Muscle aches  . Hydrochlorothiazide W-Triamterene     REACTION: palpitations  . Penicillins     REACTION: urticaria (hives)  . Statins     Subjective myalgia    Patient Measurements: Height: 5\' 3"  (160 cm) Weight: 119 lb 8 oz (54.205 kg) (scale A) IBW/kg (Calculated) : 52.4  Vital Signs: Temp: 98.9 F (37.2 C) (01/08 0538) Temp Source: Oral (01/08 0538) BP: 132/75 mmHg (01/08 0538) Pulse Rate: 75 (01/08 0538)  Labs:  Recent Labs  09/09/14 0235 09/10/14 0533 09/11/14 0521  HGB 8.9* 7.9* 8.0*  HCT 25.9* 23.0* 23.9*  PLT 172 189 216  LABPROT 14.7 16.3* 16.4*  INR 1.14 1.30 1.30  CREATININE 0.95  --  0.79    Estimated Creatinine Clearance: 50.3 mL/min (by C-G formula based on Cr of 0.79).  Assessment: 73 yof continues on low-dose apixaban for afib. She is also on aspirin 81mg  and plavix 75mg  daily. H/H is stable, platelets are WNL. No bleeding noted.   Goal of Therapy:  Therapeutic anticoagulation   Plan:  1. Continue apixaban 2.5mg  PO BID (low-dose per MD request) - consider increasing to 5mg  BID 2. F/u renal fxn, S&S of bleeding  Roshell Brigham, Drake Leachachel Lynn 09/11/2014,8:16 AM

## 2014-09-11 NOTE — Progress Notes (Signed)
Discharge instructions given to pt . [name]. Verbalized understanding . Awaiting for transport to home

## 2014-09-11 NOTE — Clinical Social Work Psychosocial (Addendum)
Clinical Social Work Department BRIEF PSYCHOSOCIAL ASSESSMENT 09/10/2014  Patient:  [name], [name]     Account Number:  [id number]     Admit date:  09/05/2014  Clinical Social Worker:  Elam Dutch  Date/Time:  09/10/2014 11:00 AM  Referred by:  Physician  Date Referred:  09/09/2014 Referred for  Psychosocial assessment   Other Referral:   Caregiver for husband at home who was recently hospitalized at [facility] type:  Patient Other interview type:    PSYCHOSOCIAL DATA Living Status:  HUSBAND Admitted from facility:   Level of care:   Primary support name:  Barrett Henle  [phone] Primary support relationship to patient:  CHILD, ADULT Degree of support available:   Strong support    CURRENT CONCERNS Current Concerns  Other - See comment   Other Concerns:   Wants a Stair life constructed in her home;  will have to go stay with her daughter    Amer[place] / PLAN CSW met with patient today to assess current concerns or needs.  Patient was living at home with her husband who has recently been a patient at [facility]. He has since been d/c'd and is now staying with their daughter Benjamine Mola.  Patient states that her concern is that their home is not compatible for their current needs as the bedrooms are upstairs and the steps to get up there are very steep. She states shat neither she nor her husband can get up the stairs.  Patinent requests assistance to obtain a "Stair Lift" system for their house.  CSW referred to [facility] who is familiar this this and will provide infornation to patient. The patient and/or family would have to contact the company to arrange an evaluation of the home and obtain a cost analysis.  Patient verbalized understanding of above.  She plans to go stay with her daughter and husband at d/c.  Feels that she can manage the short flight of steps at her daughter's house at this time. Paitent states  that she is moving now and able to get up to the bathroom without assistance. She states she is feeling much better. Her daughter is seeking FMLA to help care for her parents and has initiated the paperwork. Patient denies any further needs. CSW will sign off.   Assessment/plan status:  No Further Intervention Required Other assessment/ plan:   Information/referral to community resources:   Referred to [facility] for follow up of Home Health  Wants information re: Stair Lift system- to be provided by [facility]    PATIENT'S/FAMILY'S RESPONSE TO PLAN OF CARE: Patient is alert and oriented. Very pleasant lady who relates that she is feeling much better and is getting stronger everyday. She states that she plans to return home wiht her daughter who is currently caring for patient's husband and hopes to be able to return to their home independently as soon as possible.  Patient was noted to have a very positive attitude and is looking forward to recovery. She will agree to [place]; she does not feel she will require SNF rehab placement.  CSW signing off.

## 2014-09-11 NOTE — Progress Notes (Signed)
  HPI:  Erin PlummerDollie M Brady is a pleasant 74 yo F prior patient of Dr. Cato MulliganSwords with a PMH significant for alcohol abuse, tobacco use, poor nutrition, sedentary lifestyle, por compliance with medical recommendations, depression, diabetes mellitis, hypothyroidism, COPD, RAF, CAD, HLD and hypertension. She was scheduled for a hospital follow up appointment today, but her daughter called late today to let us know she is still unfortunately in the hospital.  I had already reviewed her hospital notes with the following summary from the most recent notes:  Myocardial infarction/CAD/PAF - per review of discharge documents: -hospitalized 1/2-1/8 for myocardial infarction (STEMI) -s/p PTCA with DES to RCA and PLA and multiple/significant residual disease -stay complicated by CHB requiring pacing, groin bleed with significant anemia - Hgb today improving at 8.0, hypotension requiring dopamine, controlled A. Fib without BB -Echo during stay with EF 55-60%, hypokinesis of basalinferior myocardium -meds: eliquis 2.5 (cardiology may increase), asa 81mg  and plavix, prn nitroglycerin, ramapril 2.5 mg, pravastatin 80mg  -per notes cardiology to arrange cardiac rehab, home health and close follow up with their office -her daughter [name]  requests FMLA paper work   Chronic and ongoing conditions: HTN: -meds 09/11/14 on discharge: ramapril 2.5mg   DM with renal manifestations/HLD:  -Hgba1c 6.6 last check with me, 7.3 in hospital 1/2 -started statin after last visit and LDL mildly elevated in hospital and statin dose increased -meds:metformin 500 bid, pravastatin 80mg  -last eye exam: reported last year -reported in the past that she sees a nephrologist   Hypothyroid:  -med: synthroid 50mcg - TSH wnl in hospital  Tobocco and Alcohol Abuse:  -chronic alcoholic and smoker, refused interventions with prior PCP and myself -I strongly advised her to quit and offered help  but in the past she  refused and reported on multiple occasions that she " loves beer" and was not willing to cut back, quit or receive help  Depression/Anxiety:  -husband had dx of cancer  -reported in the past that every time she gets stressed or depressed can't eat and loses weight  -advised psychiatric care to assist with this and alcohol abuse - she did not do this  Weight loss: -chronic, drinks a lot of beer -anxiety and depression per above -had multiple labs and referred to GI, referred to nutritionist but reported did not do any of the suggestions -often does not eat -chronic constipation - on mirilax and seeing GI to exclude other etiologies  -I advised nutritional supplements but she did not do this, nutritional supplement also advised in hospital  I am going to have my assistant contact her daughter to let her know we are sorry [name] is still in the hospital. Also I am trying to complete the FMLA paperwork for her and am not sure how to answer most of the questions as plan will likely be devised by her cardiologist. Advise daughter to discuss with cardiologist the plan, what predicted care needs from the daughter will be needed and let us know so that I can complete her paperwork. Also advise them to contact us once discharged to schedule follow up for patient's other medical problems.

## 2014-09-11 NOTE — Discharge Summary (Signed)
Physician Discharge Summary      Cardiologist:  Hochrein  Patient ID: JORDAIN RADIN MRN: [id number] DOB/AGE: Dec 22, 1940 74 y.o.  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 female with a history of nonobstructive CAD by cath in 2006 and PAF, refusing Coumadin.  She was feeling a bit poorly recently, describing a reluctance to eat and stating she had been losing weight. She was seen by Dr. 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 She was there to check her blood pressure but having no other issues. Compliance with medications and avoidance of alcohol was encouraged.  Two days prior to admission, she had onset of nausea and vomiting. After the nausea and vomiting, she had chest pain. She is unable to quantify or describe the pain further. When her symptoms did not resolve, she came to the emergency room. In the emergency room, her ECG was consistent with an inferior STEMI. She was treated with aspirin 325 mg, heparin 60 units per kilogram, and transported directly to the Cath Lab. Her initial ECG was sinus bradycardia in the 40s. Previously her heart rate had been in the 50s. In the cath lab for ECG is still significantly abnormal and she has become more bradycardic,  with a heart rate in the 30s. Emergent cardiac catheterization was indicated.  Cardiac cath revealed total mid RCA occlusion and a dominant right coronary artery.  She underwent successful PTCA/stenting of the acute infarct site in the mid RCA with the 100% occlusion being reduced to 0% with insertion of a 3.518 mm resolute DES stent postdilated to 3.75 mm and PTCA of the proximal PLA vessel with 95%. Diffuse stenoses being reduced to 20%.  She also has very proximal 70% LAD stenoses, 60% mid distal first diagonal stenosis, 50% stenosis of the LAD between the first and second diagonal branch, 90% second diagonal stenosis; left circumflex 70% stenosis after the takeoff of the first marginal branch with total occlusion of the OM 2 vessel and very distal AV groove circumflex occlusion; and total acute occlusion of the mid RCA with initial TIMI 0 flow.  The decision was made to hold off on further PCI and treat residual disease medically.  She developed CHB.  She also had a temp pacer implanted with rate increased to 80BPM.  Post cath the patient developed a groin bleed.  Pressure was held and Fem-Stop applied.  Hgb dropped as low as 7.9 and remained stable.  On 1/3 she developed 8/10 CP. She was placed on dopamine to improve BP.  Echo revealed 55% to 60%. There is moderate hypokinesis of the basalinferiormyocardium.  Nutrition consult completed due suspect malnutrition.  She was followed by EP and had improvement of conduction system.  The temp pacer was eventually removed however,  she still had episodes of bradycardia.  Beta blocker was not started. Afib was controlled.  She was placed on Eliquis 2.5 mg in addition to the ASA and plavix.  If hemoglobin remains stable, Dr. Stanford Breed recommends increasing eliquis to 5 mg twice a day at that time. She worked with cardiac rehab and her strength improved.  The day before DC she was ambulating in the hall without a walker and feeling stronger.  The patient was seen by  Dr. Kizzie Bane who felt she was stable for DC home.  She will need Phase II Cardiac Rehab.  Home Health PT and RN arranged.  The patient will be staying with her daughter.  OP Follow up consideration:  Recheck Hgb             Further Nutrition counseling   Increase Eliquis to 10m?  Eliquis Authorization #: [id number]  Consults: Cardiac rehab, EP, RD  Significant Diagnostic Studies:  EMERGENT CARDIAC CATHETERIZATION/TEMPORARY PACEMAKER/ PERCUTANEOUS CORONARY INTERVENTION     HISTORY:   DMAHLIA FERNANDOis a 74y.o. female [facility] setting of a inferior ST segment elevation myocardial infarction. Her ECG upon arrival shows deep inferior Q waves with evolving T-wave inversion inferolaterally with bradycardia and probable underlying atrial fib/flutter. She is taken acutely to the cardiac catheterization laboratory.   PROCEDURE: Temporary pacemaker insertion, Left heart catheterization: coronary angiography, left ventriculography; percutaneous coronary intervention with PTCA/DES stenting of a totally occluded mid RCA and PTCA of the posterolateral branch.  [facility] She was prepped and draped in sterile fashion. Her right femoral artery and femoral vein puncture and anteriorly and 6 French sheaths were inserted without difficulty. With her heart rate at 36 bpm. Transvenous, temp rate pacemaker was advanced via the femoral sheath to the right ventricular apex. Capture was excellent and her rate was set at 60, with an MA of 5. She was given Versed 1 mg and fentanyl 25 g. Diagnostic catheterization was done with 5 [place] for left and right coronary catheters. With the demonstration of total mid occlusion with TIMI 0 flow down the mid RCA, accounting for her Inferior STEMI percutaneous coronary intervention was performed.  Angiomax bolus plus infusion was administered. ACT was documented to be therapeutic. 180 mg of Brilinta was administered at the end of the procedure Since the patient was sleeping and snoring loudly. Of note, she appears to have sleep apnea since there were periods of witnessed apnea when she was sedated. A 6 FP[place]FR4 guide was used for the intervention. An aside, he medium wire was successfully able to cross the total occlusion and was advanced into the distal RCA. Initial dilatation was done with a 2.012 mm balloon at 8, 10, and 11 atm. This resulted in restoration of TIMI-3 flow. However, it. There was evidence for subtotal diffuse 95-99% stenosis in the proximal portion of the PLA vessel. The wire was then readvanced and was able to cross this high-grade PLA stenosis. The 2.0 balloon was then inserted and advanced into the PLA and low-level dilatations were made significant improvement in the stenosis. The 20 balloon was then removed. A 3.518 mm resolute DES stent was then inserted in the mid RCA at the point of initial total occlusion. The stent was deployed at 13 and 14 atm. A noncompliant emerge 3.7515 mm balloon was used for post stent dilatation  up to 3.75 mm. Patient received several doses of IC nitroglycerin during the procedure. Angiography confirmed an excellent angiographic result at the acute infarct site. With the 100% occlusion being reduced to 0% and in the PLA vessel at the site of PTCA to 99% stenosis was reduced to 20%. There was pristine TIMI-3 flow in the distal RCA was large. A 5 French pigtail catheter was then inserted and left ventriculography was performed. This time, the pacemaker rate was reduced and at a heart rate of 40. The patient was still pacing. The pacemaker was sutured in place as was the arterial sheath. The patient received Brilinta when she had awakened. She left the catheterization laboratory chest pain-free with stable hemodynamics with plans  to continue Angiomax for 4 hours post PCI.  HEMODYNAMICS:  Central Aorta: 100/55  Left Ventricle: 100/20  ANGIOGRAPHY:  Left main: Large caliber vessel which bifurcated into the LAD and left circumflex coronary artery.  LAD: Moderate size vessel that had tandem 70% very proximal stenoses before the takeoff of the first diagonal vessel. The first diagonal branch had mid distal 60% narrowing. There was 50% stenosis in the LAD between the first and second diagonal vessel. The second diagonal vessel had 90% stenosis focally in its midsegment. The LAD extended to the LV apex and gave rise to several septal perforating arteries.  Left circumflex: Moderate size vessel that gave rise to a prominent first marginal branch which bifurcated. It was 70% AV groove stenosis after this marginal branch. Not been at the site of where the second marginal branch had arisen. This was totally occluded and appeared old. The AV groove circumflex extended distally and at the very distal aspect appeared totally occluded. There was there were faint collaterals supplying all branches of the distal RCA.  Right coronary artery: Large dominant vessel that was totally occluded at its midsegment after the anterior RV marginal branch. There was TIMI 0 flow initially. Following initial opening of the vessel. There also was diffuse 95-99% proximal PLA stenosis and very distal 70% PLA stenosis and a small caliber vessel. Distal RCA also supplied a large PDA branch and inferior LV branch which were free of significant disease.  Following percutaneous coronary intervention. The mid RCA site, treated with PTCA, and DES stenting with a 3.518 mm resolute integrity stent postdilated to 3.75 mm was reduced from 100% to 0% and there was resumption of brisk TIMI-3 flow. There was no evidence for dissection. The proximal PLA vessel which was diffusely diseased at 95-99% was successfully treated with PTCA, the lesions were  reduced to 20%.   Left ventriculography revealed an ejection fraction approximately 45%. There was vigorous contractility of the anterior wall and apex. There was moderate diffuse hypocontractility of a large portion of the inferior wall. There appeared to be 1-2+ angiographic mitral regurgitation.   IMPRESSION:  Acute ST segment elevation inferior lateral myocardial infarction in this patient with several days of chest pain and on presentation had extensive inferior lateral Q waves with evolving T-wave inversion secondary to total mid RCA occlusion and a dominant right coronary artery.  Significant multivessel CAD with the very proximal 70% LAD stenoses, 60% mid distal first diagonal stenosis, 50% stenosis of the LAD between the first and second diagonal branch, 90% second diagonal stenosis; left circumflex 70% stenosis after the takeoff of the first marginal branch with total occlusion of the OM 2 vessel and very distal AV groove circumflex occlusion; and total acute occlusion of the mid RCA with initial TIMI  0 flow.  Profound bradycardia to a heart rate of 36 with probable underlying atrial fib/flutter treated with emergent temporary pacemaker insertion.  Successful PTCA/stenting of the acute infarct site in the mid RCA with the 100% occlusion being reduced to 0% with insertion of a 3.518 mm resolute DES stent postdilated to 3.75 mm and PTCA of the proximal PLA vessel with 95%. Diffuse stenoses being reduced to 20%.  RECOMMENDATION:  The patient was hydrated vigorously during the catheterization due to potential high likelihood of RV infarction. She has significant multivessel CAD involving her LAD and circumflex vessel and underwent successful treatment of her acute infarct vessel with DES stenting at the occlusion site and PTCA of her PLA vessel. She will be continued on Angiomax for 4 hours. An echo Doppler study will be done to reassess her mitral regurgitation. She may ultimately  require consideration for additional intervention to her proximal LAD and proximal AV groove circumflex immediately after the first marginal branch.  Echocardiogram Study Conclusions  - Left ventricle: The cavity size was normal. Wall thickness was increased in a pattern of severe LVH. Systolic function was normal. The estimated ejection fraction was in the range of 55% to 60%. There is moderate hypokinesis of the basalinferior myocardium. Doppler parameters are consistent with high ventricular filling pressure. - Mitral valve: There was mild regurgitation. - Pulmonary arteries: PA peak pressure: 33 mm Hg (S).  Troy Sine, MD, [facility] 09/05/2014  Treatments: See above  Discharge Exam: Blood pressure 132/75, pulse 75, temperature 98.9 F (37.2 C), temperature source Oral, resp. rate 16, height 5' 3"  (1.6 m), weight 119 lb 8 oz (54.205 kg), SpO2 99 %.   Disposition:       Discharge Instructions    Diet - low sodium heart healthy    Complete by:  As directed      Increase activity slowly    Complete by:  As directed             Medication List    STOP taking these medications        aspirin 81 MG chewable tablet  Replaced by:  aspirin 81 MG EC tablet     diltiazem 300 MG 24 hr capsule  Commonly known as:  CARDIZEM CD     metoprolol 50 MG tablet  Commonly known as:  LOPRESSOR      TAKE these medications        apixaban 2.5 MG Tabs tablet  Commonly known as:  ELIQUIS  Take 1 tablet (2.5 mg total) by mouth 2 (two) times daily.     aspirin 81 MG EC tablet  Take 1 tablet (81 mg total) by mouth daily.     clopidogrel 75 MG tablet  Commonly known as:  PLAVIX  Take 1 tablet (75 mg total) by mouth daily.     feeding supplement (RESOURCE BREEZE) Liqd  Take 1 Container by mouth 2 (two) times daily between meals.     levothyroxine 50 MCG tablet  Commonly known as:  SYNTHROID, LEVOTHROID  TAKE 1 TABLET (50 MCG TOTAL) BY MOUTH DAILY.     metFORMIN 500  MG tablet  Commonly known as:  GLUCOPHAGE  Take 500 mg by mouth 2 (two) times daily with a meal.     MULTI-VITAMINS PO  Take 1 tablet by mouth daily.     nitroGLYCERIN 0.4 MG SL tablet  Commonly known as:  NITROSTAT  Place 1 tablet (0.4 mg total) under the tongue every 5 (five) minutes  x 3 doses as needed for chest pain.     PARoxetine 20 MG tablet  Commonly known as:  PAXIL  Take 1 tablet (20 mg total) by mouth daily. PATIENT NEEDS OFFICE VISIT FOR ADDITIONAL REFILLS     polyethylene glycol packet  Commonly known as:  MIRALAX / GLYCOLAX  Take 17 g by mouth daily.     pravastatin 80 MG tablet  Commonly known as:  PRAVACHOL  Take 1 tablet (80 mg total) by mouth daily at 6 PM.     ramipril 2.5 MG capsule  Commonly known as:  ALTACE  Take 1 capsule (2.5 mg total) by mouth daily.       Follow-up Information    Follow up with Ignacia Bayley, NP On 09/17/2014.   Why:  11:00 AM   Contact information:   [address]  [place] [address] [phone]     Greater than 30 minutes was spent completing the patient's discharge.    SignedTarri Fuller, [place]  09/11/2014, 11:28 AM

## 2014-09-12 ENCOUNTER — Inpatient Hospital Stay (HOSPITAL_COMMUNITY): Payer: Medicare Other

## 2014-09-12 ENCOUNTER — Emergency Department (HOSPITAL_COMMUNITY): Payer: Medicare Other

## 2014-09-12 ENCOUNTER — Encounter (HOSPITAL_COMMUNITY): Admission: EM | Disposition: E | Payer: Self-pay | Source: Home / Self Care | Attending: Cardiovascular Disease

## 2014-09-12 ENCOUNTER — Encounter (HOSPITAL_COMMUNITY): Payer: Self-pay | Admitting: Emergency Medicine

## 2014-09-12 DIAGNOSIS — E785 Hyperlipidemia, unspecified: Secondary | ICD-10-CM | POA: Diagnosis present

## 2014-09-12 DIAGNOSIS — R001 Bradycardia, unspecified: Secondary | ICD-10-CM | POA: Diagnosis not present

## 2014-09-12 DIAGNOSIS — G253 Myoclonus: Secondary | ICD-10-CM | POA: Diagnosis not present

## 2014-09-12 DIAGNOSIS — E43 Unspecified severe protein-calorie malnutrition: Secondary | ICD-10-CM | POA: Diagnosis present

## 2014-09-12 DIAGNOSIS — Z9119 Patient's noncompliance with other medical treatment and regimen: Secondary | ICD-10-CM | POA: Diagnosis present

## 2014-09-12 DIAGNOSIS — F1021 Alcohol dependence, in remission: Secondary | ICD-10-CM | POA: Diagnosis not present

## 2014-09-12 DIAGNOSIS — Z7901 Long term (current) use of anticoagulants: Secondary | ICD-10-CM | POA: Diagnosis not present

## 2014-09-12 DIAGNOSIS — I10 Essential (primary) hypertension: Secondary | ICD-10-CM | POA: Diagnosis present

## 2014-09-12 DIAGNOSIS — I959 Hypotension, unspecified: Secondary | ICD-10-CM | POA: Diagnosis not present

## 2014-09-12 DIAGNOSIS — Z515 Encounter for palliative care: Secondary | ICD-10-CM | POA: Diagnosis not present

## 2014-09-12 DIAGNOSIS — D649 Anemia, unspecified: Secondary | ICD-10-CM | POA: Diagnosis present

## 2014-09-12 DIAGNOSIS — I4901 Ventricular fibrillation: Secondary | ICD-10-CM

## 2014-09-12 DIAGNOSIS — Z7982 Long term (current) use of aspirin: Secondary | ICD-10-CM

## 2014-09-12 DIAGNOSIS — I480 Paroxysmal atrial fibrillation: Secondary | ICD-10-CM | POA: Diagnosis not present

## 2014-09-12 DIAGNOSIS — E039 Hypothyroidism, unspecified: Secondary | ICD-10-CM | POA: Diagnosis present

## 2014-09-12 DIAGNOSIS — E872 Acidosis: Secondary | ICD-10-CM | POA: Diagnosis not present

## 2014-09-12 DIAGNOSIS — F17210 Nicotine dependence, cigarettes, uncomplicated: Secondary | ICD-10-CM | POA: Diagnosis not present

## 2014-09-12 DIAGNOSIS — N179 Acute kidney failure, unspecified: Secondary | ICD-10-CM | POA: Diagnosis not present

## 2014-09-12 DIAGNOSIS — Z79899 Other long term (current) drug therapy: Secondary | ICD-10-CM | POA: Diagnosis not present

## 2014-09-12 DIAGNOSIS — J449 Chronic obstructive pulmonary disease, unspecified: Secondary | ICD-10-CM | POA: Diagnosis not present

## 2014-09-12 DIAGNOSIS — J9601 Acute respiratory failure with hypoxia: Secondary | ICD-10-CM | POA: Diagnosis not present

## 2014-09-12 DIAGNOSIS — I2510 Atherosclerotic heart disease of native coronary artery without angina pectoris: Secondary | ICD-10-CM | POA: Diagnosis present

## 2014-09-12 DIAGNOSIS — E11649 Type 2 diabetes mellitus with hypoglycemia without coma: Secondary | ICD-10-CM | POA: Diagnosis not present

## 2014-09-12 DIAGNOSIS — Z789 Other specified health status: Secondary | ICD-10-CM

## 2014-09-12 DIAGNOSIS — E876 Hypokalemia: Secondary | ICD-10-CM | POA: Diagnosis not present

## 2014-09-12 DIAGNOSIS — R918 Other nonspecific abnormal finding of lung field: Secondary | ICD-10-CM | POA: Diagnosis not present

## 2014-09-12 DIAGNOSIS — I472 Ventricular tachycardia: Secondary | ICD-10-CM | POA: Diagnosis present

## 2014-09-12 DIAGNOSIS — R569 Unspecified convulsions: Secondary | ICD-10-CM | POA: Diagnosis not present

## 2014-09-12 DIAGNOSIS — R34 Anuria and oliguria: Secondary | ICD-10-CM | POA: Diagnosis not present

## 2014-09-12 DIAGNOSIS — I2119 ST elevation (STEMI) myocardial infarction involving other coronary artery of inferior wall: Secondary | ICD-10-CM | POA: Diagnosis present

## 2014-09-12 DIAGNOSIS — I5041 Acute combined systolic (congestive) and diastolic (congestive) heart failure: Secondary | ICD-10-CM | POA: Diagnosis not present

## 2014-09-12 DIAGNOSIS — Z452 Encounter for adjustment and management of vascular access device: Secondary | ICD-10-CM

## 2014-09-12 DIAGNOSIS — E875 Hyperkalemia: Secondary | ICD-10-CM | POA: Diagnosis not present

## 2014-09-12 DIAGNOSIS — G931 Anoxic brain damage, not elsewhere classified: Secondary | ICD-10-CM | POA: Diagnosis not present

## 2014-09-12 DIAGNOSIS — I5022 Chronic systolic (congestive) heart failure: Secondary | ICD-10-CM | POA: Diagnosis present

## 2014-09-12 DIAGNOSIS — R570 Cardiogenic shock: Secondary | ICD-10-CM | POA: Diagnosis present

## 2014-09-12 DIAGNOSIS — Z66 Do not resuscitate: Secondary | ICD-10-CM | POA: Diagnosis not present

## 2014-09-12 DIAGNOSIS — E1165 Type 2 diabetes mellitus with hyperglycemia: Secondary | ICD-10-CM | POA: Diagnosis present

## 2014-09-12 DIAGNOSIS — G936 Cerebral edema: Secondary | ICD-10-CM | POA: Diagnosis not present

## 2014-09-12 DIAGNOSIS — I517 Cardiomegaly: Secondary | ICD-10-CM | POA: Diagnosis not present

## 2014-09-12 DIAGNOSIS — R451 Restlessness and agitation: Secondary | ICD-10-CM | POA: Diagnosis not present

## 2014-09-12 DIAGNOSIS — J9690 Respiratory failure, unspecified, unspecified whether with hypoxia or hypercapnia: Secondary | ICD-10-CM | POA: Diagnosis not present

## 2014-09-12 DIAGNOSIS — I252 Old myocardial infarction: Secondary | ICD-10-CM

## 2014-09-12 DIAGNOSIS — Z955 Presence of coronary angioplasty implant and graft: Secondary | ICD-10-CM

## 2014-09-12 DIAGNOSIS — Z880 Allergy status to penicillin: Secondary | ICD-10-CM | POA: Diagnosis not present

## 2014-09-12 DIAGNOSIS — I059 Rheumatic mitral valve disease, unspecified: Secondary | ICD-10-CM | POA: Diagnosis not present

## 2014-09-12 DIAGNOSIS — R4182 Altered mental status, unspecified: Secondary | ICD-10-CM | POA: Diagnosis not present

## 2014-09-12 DIAGNOSIS — I4892 Unspecified atrial flutter: Secondary | ICD-10-CM | POA: Diagnosis not present

## 2014-09-12 DIAGNOSIS — J439 Emphysema, unspecified: Secondary | ICD-10-CM | POA: Diagnosis not present

## 2014-09-12 DIAGNOSIS — J9600 Acute respiratory failure, unspecified whether with hypoxia or hypercapnia: Secondary | ICD-10-CM | POA: Diagnosis not present

## 2014-09-12 DIAGNOSIS — I469 Cardiac arrest, cause unspecified: Secondary | ICD-10-CM

## 2014-09-12 DIAGNOSIS — R4020 Unspecified coma: Secondary | ICD-10-CM | POA: Diagnosis not present

## 2014-09-12 DIAGNOSIS — Z4682 Encounter for fitting and adjustment of non-vascular catheter: Secondary | ICD-10-CM | POA: Diagnosis not present

## 2014-09-12 DIAGNOSIS — I493 Ventricular premature depolarization: Secondary | ICD-10-CM | POA: Diagnosis present

## 2014-09-12 DIAGNOSIS — J811 Chronic pulmonary edema: Secondary | ICD-10-CM | POA: Diagnosis not present

## 2014-09-12 HISTORY — DX: Cardiac arrest, cause unspecified: I46.9

## 2014-09-12 HISTORY — PX: LEFT HEART CATH: SHX5478

## 2014-09-12 HISTORY — DX: Ventricular fibrillation: I49.01

## 2014-09-12 LAB — COMPREHENSIVE METABOLIC PANEL
ALBUMIN: 2.3 g/dL — AB (ref 3.5–5.2)
ALK PHOS: 104 U/L (ref 39–117)
ALT: 83 U/L — ABNORMAL HIGH (ref 0–35)
ALT: 143 U/L — AB (ref 0–35)
ANION GAP: 15 (ref 5–15)
AST: 110 U/L — AB (ref 0–37)
AST: 179 U/L — ABNORMAL HIGH (ref 0–37)
Albumin: 2 g/dL — ABNORMAL LOW (ref 3.5–5.2)
Alkaline Phosphatase: 89 U/L (ref 39–117)
Anion gap: 6 (ref 5–15)
BILIRUBIN TOTAL: 0.5 mg/dL (ref 0.3–1.2)
BUN: 9 mg/dL (ref 6–23)
BUN: 12 mg/dL (ref 6–23)
CHLORIDE: 111 meq/L (ref 96–112)
CO2: 12 mmol/L — AB (ref 19–32)
CO2: 17 mmol/L — AB (ref 19–32)
Calcium: 8.3 mg/dL — ABNORMAL LOW (ref 8.4–10.5)
Calcium: 6.6 mg/dL — ABNORMAL LOW (ref 8.4–10.5)
Chloride: 113 mEq/L — ABNORMAL HIGH (ref 96–112)
Creatinine, Ser: 0.98 mg/dL (ref 0.50–1.10)
Creatinine, Ser: 0.86 mg/dL (ref 0.50–1.10)
GFR calc Af Amer: 64 mL/min — ABNORMAL LOW (ref >90)
GFR calc non Af Amer: 55 mL/min — ABNORMAL LOW (ref >90)
GFR calc non Af Amer: 64 mL/min — ABNORMAL LOW (ref >90)
GFR, EST AFRICAN AMERICAN: 75 mL/min — AB (ref >90)
Glucose, Bld: 311 mg/dL — ABNORMAL HIGH (ref 70–99)
Glucose, Bld: 301 mg/dL — ABNORMAL HIGH (ref 70–99)
Potassium: 5.1 mmol/L (ref 3.5–5.1)
Potassium: 4 mmol/L (ref 3.5–5.1)
Sodium: 138 mmol/L (ref 135–145)
Sodium: 136 mmol/L (ref 135–145)
TOTAL PROTEIN: 4.1 g/dL — AB (ref 6.0–8.3)
Total Bilirubin: 0.8 mg/dL (ref 0.3–1.2)
Total Protein: 4.2 g/dL — ABNORMAL LOW (ref 6.0–8.3)

## 2014-09-12 LAB — MAGNESIUM
Magnesium: 2.2 mg/dL (ref 1.5–2.5)
Magnesium: 1.6 mg/dL (ref 1.5–2.5)

## 2014-09-12 LAB — BLOOD GAS, ARTERIAL
Acid-base deficit: 7.6 mmol/L — ABNORMAL HIGH (ref 0.0–2.0)
BICARBONATE: 16.7 meq/L — AB (ref 20.0–24.0)
DRAWN BY: 418751
FIO2: 50 %
LHR: 20 {breaths}/min
MECHVT: 490 mL
O2 Saturation: 98.9 %
PEEP: 5 cmH2O
PO2 ART: 165 mmHg — AB (ref 80.0–100.0)
Patient temperature: 98.6
TCO2: 17.6 mmol/L (ref 0–100)
pCO2 arterial: 29.6 mmHg — ABNORMAL LOW (ref 35.0–45.0)
pH, Arterial: 7.37 (ref 7.350–7.450)

## 2014-09-12 LAB — POCT I-STAT, CHEM 8
BUN: 11 mg/dL (ref 6–23)
BUN: 12 mg/dL (ref 6–23)
BUN: 13 mg/dL (ref 6–23)
CALCIUM ION: 1.16 mmol/L (ref 1.13–1.30)
CALCIUM ION: 1.11 mmol/L — AB (ref 1.13–1.30)
CHLORIDE: 110 meq/L (ref 96–112)
CREATININE: 0.8 mg/dL (ref 0.50–1.10)
Calcium, Ion: 1.14 mmol/L (ref 1.13–1.30)
Chloride: 106 mEq/L (ref 96–112)
Chloride: 111 mEq/L (ref 96–112)
Creatinine, Ser: 0.7 mg/dL (ref 0.50–1.10)
Creatinine, Ser: 0.8 mg/dL (ref 0.50–1.10)
Glucose, Bld: 297 mg/dL — ABNORMAL HIGH (ref 70–99)
Glucose, Bld: 352 mg/dL — ABNORMAL HIGH (ref 70–99)
Glucose, Bld: 347 mg/dL — ABNORMAL HIGH (ref 70–99)
HCT: 28 % — ABNORMAL LOW (ref 36.0–46.0)
HCT: 29 % — ABNORMAL LOW (ref 36.0–46.0)
HCT: 31 % — ABNORMAL LOW (ref 36.0–46.0)
HEMOGLOBIN: 9.9 g/dL — AB (ref 12.0–15.0)
HEMOGLOBIN: 10.5 g/dL — AB (ref 12.0–15.0)
Hemoglobin: 9.5 g/dL — ABNORMAL LOW (ref 12.0–15.0)
POTASSIUM: 4.1 mmol/L (ref 3.5–5.1)
Potassium: 3.9 mmol/L (ref 3.5–5.1)
Potassium: 3.7 mmol/L (ref 3.5–5.1)
SODIUM: 141 mmol/L (ref 135–145)
SODIUM: 139 mmol/L (ref 135–145)
SODIUM: 135 mmol/L (ref 135–145)
TCO2: 15 mmol/L (ref 0–100)
TCO2: 15 mmol/L (ref 0–100)
TCO2: 15 mmol/L (ref 0–100)

## 2014-09-12 LAB — CBC WITH DIFFERENTIAL/PLATELET
BASOS ABS: 0 10*3/uL (ref 0.0–0.1)
BASOS PCT: 0 % (ref 0–1)
Eosinophils Absolute: 0.2 10*3/uL (ref 0.0–0.7)
Eosinophils Relative: 1 % (ref 0–5)
HCT: 26.7 % — ABNORMAL LOW (ref 36.0–46.0)
Hemoglobin: 8.9 g/dL — ABNORMAL LOW (ref 12.0–15.0)
LYMPHS ABS: 4 10*3/uL (ref 0.7–4.0)
Lymphocytes Relative: 24 % (ref 12–46)
MCH: 30.4 pg (ref 26.0–34.0)
MCHC: 33.3 g/dL (ref 30.0–36.0)
MCV: 91.1 fL (ref 78.0–100.0)
MONO ABS: 0.7 10*3/uL (ref 0.1–1.0)
MONOS PCT: 4 % (ref 3–12)
NEUTROS ABS: 11.9 10*3/uL — AB (ref 1.7–7.7)
NEUTROS PCT: 71 % (ref 43–77)
Platelets: 294 10*3/uL (ref 150–400)
RBC: 2.93 MIL/uL — AB (ref 3.87–5.11)
RDW: 14.7 % (ref 11.5–15.5)
WBC: 16.8 10*3/uL — AB (ref 4.0–10.5)

## 2014-09-12 LAB — I-STAT CHEM 8, ED
BUN: 11 mg/dL (ref 6–23)
CREATININE: 0.9 mg/dL (ref 0.50–1.10)
Calcium, Ion: 1.2 mmol/L (ref 1.13–1.30)
Chloride: 109 mEq/L (ref 96–112)
Glucose, Bld: 310 mg/dL — ABNORMAL HIGH (ref 70–99)
HCT: 30 % — ABNORMAL LOW (ref 36.0–46.0)
Hemoglobin: 10.2 g/dL — ABNORMAL LOW (ref 12.0–15.0)
POTASSIUM: 5.1 mmol/L (ref 3.5–5.1)
Sodium: 135 mmol/L (ref 135–145)
TCO2: 11 mmol/L (ref 0–100)

## 2014-09-12 LAB — GLUCOSE, CAPILLARY
GLUCOSE-CAPILLARY: 306 mg/dL — AB (ref 70–99)
Glucose-Capillary: 271 mg/dL — ABNORMAL HIGH (ref 70–99)
Glucose-Capillary: 348 mg/dL — ABNORMAL HIGH (ref 70–99)

## 2014-09-12 LAB — PHOSPHORUS: Phosphorus: 7 mg/dL — ABNORMAL HIGH (ref 2.3–4.6)

## 2014-09-12 LAB — I-STAT ARTERIAL BLOOD GAS, ED
ACID-BASE DEFICIT: 16 mmol/L — AB (ref 0.0–2.0)
BICARBONATE: 12 meq/L — AB (ref 20.0–24.0)
O2 SAT: 76 %
TCO2: 13 mmol/L (ref 0–100)
pCO2 arterial: 35.5 mmHg (ref 35.0–45.0)
pH, Arterial: 7.134 — CL (ref 7.350–7.450)
pO2, Arterial: 51 mmHg — ABNORMAL LOW (ref 80.0–100.0)

## 2014-09-12 LAB — PROTIME-INR
INR: 1.39 (ref 0.00–1.49)
INR: 1.5 — ABNORMAL HIGH (ref 0.00–1.49)
PROTHROMBIN TIME: 17.2 s — AB (ref 11.6–15.2)
Prothrombin Time: 18.2 seconds — ABNORMAL HIGH (ref 11.6–15.2)

## 2014-09-12 LAB — APTT: aPTT: 66 seconds — ABNORMAL HIGH (ref 24–37)

## 2014-09-12 LAB — POCT I-STAT TROPONIN I: TROPONIN I, POC: 4.99 ng/mL — AB (ref 0.00–0.08)

## 2014-09-12 LAB — TSH: TSH: 4.093 u[IU]/mL (ref 0.350–4.500)

## 2014-09-12 LAB — I-STAT CG4 LACTIC ACID, ED: Lactic Acid, Venous: 9.53 mmol/L — ABNORMAL HIGH (ref 0.5–2.2)

## 2014-09-12 LAB — TROPONIN I: Troponin I: 3.62 ng/mL (ref <0.031)

## 2014-09-12 LAB — BRAIN NATRIURETIC PEPTIDE: B NATRIURETIC PEPTIDE 5: 1361.7 pg/mL — AB (ref 0.0–100.0)

## 2014-09-12 SURGERY — LEFT HEART CATH
Anesthesia: Moderate Sedation | Laterality: Bilateral

## 2014-09-12 MED ORDER — PROPOFOL 10 MG/ML IV EMUL
5.0000 ug/kg/min | Freq: Once | INTRAVENOUS | Status: AC
  Administered 2014-09-12: 10 ug/kg/min via INTRAVENOUS

## 2014-09-12 MED ORDER — CLOPIDOGREL BISULFATE 75 MG PO TABS: 75.0000 mg | ORAL_TABLET | Freq: Every day | ORAL | Status: DC

## 2014-09-12 MED ORDER — FENTANYL BOLUS VIA INFUSION
25.0000 ug | INTRAVENOUS | Status: DC | PRN
  Filled 2014-09-12: qty 25

## 2014-09-12 MED ORDER — NITROGLYCERIN 1 MG/10 ML FOR IR/CATH LAB
INTRA_ARTERIAL | Status: AC
  Filled 2014-09-12: qty 10

## 2014-09-12 MED ORDER — MIDAZOLAM BOLUS VIA INFUSION
1.0000 mg | INTRAVENOUS | Status: DC | PRN
  Filled 2014-09-12: qty 1

## 2014-09-12 MED ORDER — SODIUM CHLORIDE 0.9 % IV SOLN
25.0000 ug/h | INTRAVENOUS | Status: DC
  Administered 2014-09-13 – 2014-09-14 (×2): 250 ug/h via INTRAVENOUS
  Filled 2014-09-12 (×4): qty 50

## 2014-09-12 MED ORDER — SODIUM CHLORIDE 0.9 % IV SOLN
250.0000 mL | INTRAVENOUS | Status: DC | PRN
  Administered 2014-09-12: 250 mL via INTRAVENOUS

## 2014-09-12 MED ORDER — SODIUM CHLORIDE 0.9 % IV SOLN
1.0000 mg/h | INTRAVENOUS | Status: DC
  Administered 2014-09-13: 1 mg/h via INTRAVENOUS
  Administered 2014-09-14: 3 mg/h via INTRAVENOUS
  Filled 2014-09-12 (×4): qty 10

## 2014-09-12 MED ORDER — PRAVASTATIN SODIUM 80 MG PO TABS
80.0000 mg | ORAL_TABLET | Freq: Every day | ORAL | Status: DC
  Filled 2014-09-12: qty 1

## 2014-09-12 MED ORDER — MIDAZOLAM HCL 2 MG/2ML IJ SOLN
1.0000 mg | Freq: Once | INTRAMUSCULAR | Status: DC
Start: 2014-09-12 — End: 2014-09-15

## 2014-09-12 MED ORDER — METOPROLOL TARTRATE 12.5 MG HALF TABLET
12.5000 mg | ORAL_TABLET | Freq: Two times a day (BID) | ORAL | Status: DC
  Administered 2014-09-12 – 2014-09-19 (×13): 12.5 mg
  Filled 2014-09-12 (×15): qty 1

## 2014-09-12 MED ORDER — CISATRACURIUM BOLUS VIA INFUSION
0.0500 mg/kg | INTRAVENOUS | Status: DC | PRN
  Filled 2014-09-12: qty 3

## 2014-09-12 MED ORDER — HEPARIN SODIUM (PORCINE) 1000 UNIT/ML IJ SOLN
INTRAMUSCULAR | Status: AC
  Filled 2014-09-12: qty 1

## 2014-09-12 MED ORDER — SODIUM CHLORIDE 0.9 % IV SOLN
10.0000 ug/h | INTRAVENOUS | Status: DC
  Filled 2014-09-12: qty 50

## 2014-09-12 MED ORDER — METOPROLOL TARTRATE 12.5 MG HALF TABLET
12.5000 mg | ORAL_TABLET | Freq: Two times a day (BID) | ORAL | Status: DC
Start: 2014-09-12 — End: 2014-09-12
  Filled 2014-09-12: qty 1

## 2014-09-12 MED ORDER — HEPARIN (PORCINE) IN NACL 2-0.9 UNIT/ML-% IJ SOLN
INTRAMUSCULAR | Status: AC
  Filled 2014-09-12: qty 1500

## 2014-09-12 MED ORDER — ARTIFICIAL TEARS OP OINT
1.0000 "application " | TOPICAL_OINTMENT | Freq: Three times a day (TID) | OPHTHALMIC | Status: DC
  Administered 2014-09-12 – 2014-09-14 (×7): 1 via OPHTHALMIC
  Filled 2014-09-12: qty 3.5

## 2014-09-12 MED ORDER — ASPIRIN EC 81 MG PO TBEC: 81.0000 mg | DELAYED_RELEASE_TABLET | Freq: Every day | ORAL | Status: DC

## 2014-09-12 MED ORDER — SODIUM CHLORIDE 0.9 % IV SOLN: 2000.0000 mL | Freq: Once | INTRAVENOUS | Status: DC

## 2014-09-12 MED ORDER — LEVOTHYROXINE SODIUM 50 MCG PO TABS
50.0000 ug | ORAL_TABLET | Freq: Every day | ORAL | Status: DC
  Administered 2014-09-13 – 2014-09-18 (×6): 50 ug
  Filled 2014-09-12 (×7): qty 1

## 2014-09-12 MED ORDER — SODIUM CHLORIDE 0.9 % IV SOLN
1.0000 ug/kg/min | INTRAVENOUS | Status: DC
  Administered 2014-09-12: 1 ug/kg/min via INTRAVENOUS
  Administered 2014-09-13: 1.5 ug/kg/min via INTRAVENOUS
  Filled 2014-09-12 (×2): qty 20

## 2014-09-12 MED ORDER — FENTANYL CITRATE 0.05 MG/ML IJ SOLN: 50.0000 ug | Freq: Once | INTRAMUSCULAR | Status: DC

## 2014-09-12 MED ORDER — PANTOPRAZOLE SODIUM 40 MG IV SOLR
40.0000 mg | Freq: Every day | INTRAVENOUS | Status: DC
  Administered 2014-09-12 – 2014-09-15 (×4): 40 mg via INTRAVENOUS
  Filled 2014-09-12 (×5): qty 40

## 2014-09-12 MED ORDER — SODIUM CHLORIDE 0.9 % IJ SOLN: 3.0000 mL | INTRAMUSCULAR | Status: DC | PRN

## 2014-09-12 MED ORDER — LEVOTHYROXINE SODIUM 50 MCG PO TABS
50.0000 ug | ORAL_TABLET | Freq: Every day | ORAL | Status: DC
  Filled 2014-09-12: qty 1

## 2014-09-12 MED ORDER — CHLORHEXIDINE GLUCONATE 0.12 % MT SOLN
15.0000 mL | Freq: Two times a day (BID) | OROMUCOSAL | Status: DC
  Administered 2014-09-12 – 2014-09-18 (×13): 15 mL via OROMUCOSAL
  Filled 2014-09-12 (×12): qty 15

## 2014-09-12 MED ORDER — VERAPAMIL HCL 2.5 MG/ML IV SOLN
INTRAVENOUS | Status: AC
  Filled 2014-09-12: qty 2

## 2014-09-12 MED ORDER — ONDANSETRON HCL 4 MG/2ML IJ SOLN: 4.0000 mg | Freq: Four times a day (QID) | INTRAMUSCULAR | Status: DC | PRN

## 2014-09-12 MED ORDER — SODIUM CHLORIDE 0.9 % IV SOLN
INTRAVENOUS | Status: DC
  Administered 2014-09-12: 2.9 [IU]/h via INTRAVENOUS
  Filled 2014-09-12: qty 2.5

## 2014-09-12 MED ORDER — HEPARIN (PORCINE) IN NACL 100-0.45 UNIT/ML-% IJ SOLN
600.0000 [IU]/h | INTRAMUSCULAR | Status: DC
  Administered 2014-09-12: 800 [IU]/h via INTRAVENOUS
  Filled 2014-09-12: qty 250

## 2014-09-12 MED ORDER — MIDAZOLAM HCL 2 MG/2ML IJ SOLN
5.0000 mg | Freq: Once | INTRAMUSCULAR | Status: AC
  Administered 2014-09-12: 5 mg via INTRAVENOUS

## 2014-09-12 MED ORDER — SODIUM BICARBONATE 8.4 % IV SOLN
INTRAVENOUS | Status: AC
  Filled 2014-09-12: qty 50

## 2014-09-12 MED ORDER — CISATRACURIUM BOLUS VIA INFUSION
0.1000 mg/kg | Freq: Once | INTRAVENOUS | Status: DC
  Filled 2014-09-12: qty 6

## 2014-09-12 MED ORDER — MIDAZOLAM HCL 2 MG/2ML IJ SOLN
INTRAMUSCULAR | Status: AC
Start: 2014-09-12 — End: 2014-09-13
  Filled 2014-09-12: qty 6

## 2014-09-12 MED ORDER — AMIODARONE HCL IN DEXTROSE 360-4.14 MG/200ML-% IV SOLN
INTRAVENOUS | Status: AC
  Administered 2014-09-12: 60 mg/h via INTRAVENOUS
  Filled 2014-09-12: qty 200

## 2014-09-12 MED ORDER — ASPIRIN 300 MG RE SUPP
300.0000 mg | Freq: Once | RECTAL | Status: AC
  Administered 2014-09-12: 300 mg via RECTAL
  Filled 2014-09-12: qty 1

## 2014-09-12 MED ORDER — ACETAMINOPHEN 325 MG PO TABS: 650.0000 mg | ORAL_TABLET | ORAL | Status: DC | PRN

## 2014-09-12 MED ORDER — LIDOCAINE HCL (PF) 1 % IJ SOLN
INTRAMUSCULAR | Status: AC
  Filled 2014-09-12: qty 30

## 2014-09-12 MED ORDER — SODIUM CHLORIDE 0.9 % IV SOLN
0.0000 mg/h | INTRAVENOUS | Status: DC
  Filled 2014-09-12: qty 10

## 2014-09-12 MED ORDER — NOREPINEPHRINE BITARTRATE 1 MG/ML IV SOLN
0.0000 ug/min | INTRAVENOUS | Status: DC
  Administered 2014-09-13: 5 ug/min via INTRAVENOUS
  Filled 2014-09-12: qty 4

## 2014-09-12 MED ORDER — LEVOFLOXACIN IN D5W 750 MG/150ML IV SOLN
750.0000 mg | INTRAVENOUS | Status: DC
  Administered 2014-09-12: 750 mg via INTRAVENOUS
  Filled 2014-09-12 (×2): qty 150

## 2014-09-12 MED ORDER — SODIUM CHLORIDE 0.9 % IV BOLUS (SEPSIS)
2000.0000 mL | Freq: Once | INTRAVENOUS | Status: AC
  Administered 2014-09-12: 2000 mL via INTRAVENOUS

## 2014-09-12 MED ORDER — PRAVASTATIN SODIUM 80 MG PO TABS
80.0000 mg | ORAL_TABLET | Freq: Every day | ORAL | Status: DC
  Administered 2014-09-13 – 2014-09-18 (×6): 80 mg
  Filled 2014-09-12 (×8): qty 1

## 2014-09-12 MED ORDER — PROPOFOL 10 MG/ML IV EMUL
INTRAVENOUS | Status: AC
  Filled 2014-09-12: qty 100

## 2014-09-12 MED ORDER — NITROGLYCERIN 0.4 MG SL SUBL: 0.4000 mg | SUBLINGUAL_TABLET | SUBLINGUAL | Status: DC | PRN

## 2014-09-12 MED ORDER — INSULIN ASPART 100 UNIT/ML ~~LOC~~ SOLN
0.0000 [IU] | SUBCUTANEOUS | Status: DC
  Administered 2014-09-12: 5 [IU] via SUBCUTANEOUS

## 2014-09-12 MED ORDER — AMIODARONE HCL IN DEXTROSE 360-4.14 MG/200ML-% IV SOLN
60.0000 mg/h | Freq: Once | INTRAVENOUS | Status: AC
  Administered 2014-09-12: 60 mg/h via INTRAVENOUS

## 2014-09-12 MED ORDER — FUROSEMIDE 10 MG/ML IJ SOLN
80.0000 mg | Freq: Once | INTRAMUSCULAR | Status: AC
  Administered 2014-09-12: 80 mg via INTRAVENOUS
  Filled 2014-09-12: qty 8

## 2014-09-12 MED ORDER — CLOPIDOGREL BISULFATE 75 MG PO TABS
75.0000 mg | ORAL_TABLET | Freq: Every day | ORAL | Status: DC
  Administered 2014-09-13 – 2014-09-19 (×7): 75 mg
  Filled 2014-09-12 (×7): qty 1

## 2014-09-12 MED ORDER — SODIUM CHLORIDE 0.9 % IJ SOLN
3.0000 mL | Freq: Two times a day (BID) | INTRAMUSCULAR | Status: DC
  Administered 2014-09-14: 10 mL via INTRAVENOUS
  Administered 2014-09-15: 3 mL via INTRAVENOUS

## 2014-09-12 MED ORDER — CETYLPYRIDINIUM CHLORIDE 0.05 % MT LIQD
7.0000 mL | Freq: Four times a day (QID) | OROMUCOSAL | Status: DC
  Administered 2014-09-13 – 2014-09-19 (×26): 7 mL via OROMUCOSAL

## 2014-09-12 MED ORDER — ASPIRIN 81 MG PO CHEW
81.0000 mg | CHEWABLE_TABLET | Freq: Every day | ORAL | Status: DC
  Administered 2014-09-13 – 2014-09-19 (×7): 81 mg
  Filled 2014-09-12 (×7): qty 1

## 2014-09-12 NOTE — Procedures (Signed)
Arterial Catheter Insertion Procedure Note [name] [id number] [birth year]  Procedure: Insertion of Arterial Catheter  Indications: Blood pressure monitoring and Frequent blood sampling  Procedure Details Consent: Risks of procedure as well as the alternatives and risks of each were explained to the (patient/caregiver).  Consent for procedure obtained. and Unable to obtain consent because of emergent medical necessity. Time Out: Verified patient identification, verified procedure, site/side was marked, verified correct patient position, special equipment/implants available, medications/allergies/relevent history reviewed, required imaging and test results available.  Performed  Maximum sterile technique was used including antiseptics, cap, gloves, gown, hand hygiene, mask and sheet. Skin prep: Chlorhexidine; local anesthetic administered 20 gauge catheter was inserted into left radial artery using the Seldinger technique.  Evaluation Blood flow good; BP tracing good. Complications: No apparent complications.   Leafy HalfSnider, Iriana Artley [place] 09/16/2014

## 2014-09-12 NOTE — ED Notes (Signed)
Ice applied to groin and underarms.   

## 2014-09-12 NOTE — ED Notes (Signed)
Pt transported to cath lab by staff

## 2014-09-12 NOTE — ED Notes (Signed)
Amido stopped per verbal order EDP due to pt BP

## 2014-09-12 NOTE — ED Notes (Signed)
Pt [facility] ED [facility] from [facility].  Pt was in bed with husband and began to complain of not feeling well- husband called EMS.  Upon EMS arrival pt noted to be unresponsive and in coarse v-fib.  EMS shocked pt X1- pt went into asystole.  10 minutes of CPR given with 2 Epi.  Pt then regained pulses and noted to be in sinus tach.  Pt intubated an IO in place upon arrival- pt being paced by EMS.

## 2014-09-12 NOTE — Progress Notes (Signed)
OK to use central line per Cindee LamePete, NP. [place], Mitzi HansenJessica Marie

## 2014-09-12 NOTE — CV Procedure (Signed)
Cardiac Catheterization Procedure Note  Name: [name] MRN: [id number] DOB: 1941-09-03  Procedure: Left Heart Cath, Selective Coronary Angiography, LV angiography  Indication: Out of hospital ventricular fibrillation cardiac arrest. This is a 74 year old chronically ill patient who recently sustained an inferior wall STEMI. She was discharged from the hospital yesterday in stable condition. Apparently, she suddenly began to feel bad today. She lost consciousness at home and her family called EMS. On their arrival the patient was in ventricular fibrillation and she was defibrillated 1. She required a total of approximately 20 minutes of CPR by report. Her postresuscitation EKG demonstrated inferolateral ST segment elevation. We brought her emergently to the cardiac catheterization lab to evaluate for the possibility of stent thrombosis in the right coronary artery.   Procedural Details: The right wrist was prepped, draped, and anesthetized with 1% lidocaine. Using the modified Seldinger technique, a 5/6 French Slender sheath was introduced into the right radial artery. 3 mg of verapamil was administered through the sheath, weight-based unfractionated heparin was administered intravenously. Standard Judkins catheters were used for selective coronary angiography and left ventriculography. Catheter exchanges were performed over an exchange length guidewire. There were no immediate procedural complications. A TR band was used for radial hemostasis at the completion of the procedure.  The patient was transferred to the post catheterization recovery area for further monitoring.  Procedural Findings: Hemodynamics: AO 155/96 LV 155/43  Coronary angiography: Coronary dominance: right  Left mainstem: The left main stem is widely patent. There is no obstruction. There is moderate calcification of the vessel divides into the LAD and left circumflex X.  Left anterior descending (LAD): The  proximal LAD is severely diseased. There is diffuse 80% stenosis from the proximal aspect of the vessel extending beyond the first 2 diagonal branches. The mid LAD is also diffusely diseased with 50% stenosis. The vessel extends to the LV apex. The first and second diagonal branches are both diseased with 70% stenosis of the first diagonal and 90% stenosis of the second diagonal.  Left circumflex (LCx): The first obtuse marginal branch has 75% stenosis. The AV circumflex has 60-70% stenosis. The second OM appears to be chronically occluded.  Right coronary artery (RCA): This is a large, dominant vessel. The proximal vessel has 30-40% stenosis. The stented segment in the mid vessel is widely patent without restenosis. The distal vessel has mild irregularity. The PDA and PLA branches are patent and they fill somewhat slowly. There is diffuse disease in the small vessels. The second PLA branch has a 90% stenosis at its bifurcation point where the vessel is very small in caliber.  Left ventriculography: The basal and midinferior wall are akinetic. The anterolateral wall and apex contract normally. The LVEF is estimated at 45-50%.  Estimated Blood Loss: Minimal  Final Conclusions:   1. Severe stenosis of the LAD and left circumflex without significant change from the previous study 2. Widely patent RCA with no significant stenosis of the recently placed stent 3. Mild LV systolic dysfunction with inferior wall akinesis and an estimated LVEF of 45-50%.  Recommendations: The cause of the patient's V. fib arrest is unclear. She has no evidence of stent thrombosis. She certainly has significant coronary disease and could have had an ischemic event. She also may have had a VF arrest related to her recent infarction with scar in the inferior wall. Supportive care will be instituted. I've spoken with Dr. Craige CottaSood of [facility]. The patient will be initiated on hypothermia protocol. With her elevated  lactate, leukocytosis,  and acidosis, her prognosis for neurologic recovery is not good.  Tonny Bollman MD, [facility] 27-Sep-2014, 5:19 PM

## 2014-09-12 NOTE — ED Provider Notes (Signed)
Pt seen on arrival.  Seen, and discussed with Dr. Dalene SeltzerSchlossman.  PT s/p V. Fib arest, defib--Asystole, CPR + Epi x 2--Sinus Tach with ROSC.  Intubated.  S/P AIWMI 1/2 dose post PTCA of the right coronary. Discharge yesterday.  Complaint of "not feeling well" before becoming unresponsive at home. [place] response time by paramedics. Additional 10 minutes with above interventions before return of spontaneous circulation.  On arrival intubated, sinus tachycardia, spontaneous pulses. Arrives with initial pacemaker ongoing. This was stopped. Continues with spontaneous circulation and pulses.  Given amiodarone 150 IV with drip. Given Versed for sedation, followed by propofol. Given rectal aspirin. NG tube and Foley placed. Started on hypothermia protocol.  Code STEMI and else prior to arrival. Dr. Excell Seltzerooper present shortly after patient's arrival. Plan is to Cath Lab for angiogram. Patient withdrawing lower extremities greater than UE c deep stimulation. Slight cough.  CRITICAL CARE Performed by: Rolland PorterJAMES, Nedda Gains JOSEPH   Total critical care time: 30min 15:45 to 16:15  Critical care time was exclusive of separately billable procedures and treating other patients.  Critical care was necessary to treat or prevent imminent or life-threatening deterioration.  Critical care was time spent personally by me on the following activities: development of treatment plan with patient and/or surrogate as well as nursing, discussions with consultants, evaluation of patient's response to treatment, examination of patient, obtaining history from patient or surrogate, ordering and performing treatments and interventions, ordering and review of laboratory studies, ordering and review of radiographic studies, pulse oximetry and re-evaluation of patient's condition.   Rolland PorterMark Rowena Moilanen, MD 08-06-15 [phone]

## 2014-09-12 NOTE — Progress Notes (Signed)
RT attempted to place Aline by 2 RT's with a total of 3 sticks- unable to obtain. RN aware. Will let Lead therapist try.

## 2014-09-12 NOTE — ED Provider Notes (Signed)
CSN: [id number]     Arrival date & time 09/09/2014  1536 History   First MD Initiated Contact with Patient 2015/08/17 1548     Chief Complaint  Patient presents with  . Post CPR      (Consider location/radiation/quality/duration/timing/severity/associated sxs/prior Treatment) Patient is a 74 y.o. female presenting with general illness.  Illness Location:  Cardiac arrest, felt fatigued per family, arrested prior to EMS arrival Quality:  Vfib arrest, received defib then asystole 10-5415min CPR epi x2, then ROSC, intubated Severity:  Severe Onset quality:  Sudden Duration:  1 hour Timing:  Constant Progression:  Unchanged Chronicity:  New Context:  Admitted with inferior STEMI last week` Relieved by:  Epi/defib Ear pain: CAD, DM, recent stent placement.   Associated symptoms comment:  Unknown, had complained of fatigue "not feeling well" per family report to EMS    Past Medical History  Diagnosis Date  . PAF (paroxysmal atrial fibrillation)   . COPD (chronic obstructive pulmonary disease)   . Hyperlipidemia   . Hypothyroidism   . Arthritis   . Diabetes mellitus   . History of EMG 11/00    negative  . Ventricular fibrillation 09/13/2014  . Cardiac arrest 09/06/2014   Past Surgical History  Procedure Laterality Date  . Ankle fracture surgery      left  . Thyroid colloid cyst   12/00  . Cardiac catheterization  06/16/05    Lmain 20%, LAD 20/40%, D1 & D2 30%, CFX 30%, RCA 50%, EF 75%   . Left heart catheterization with coronary angiogram N/A 09/05/2014    Procedure: LEFT HEART CATHETERIZATION WITH CORONARY ANGIOGRAM;  Surgeon: Lennette Biharihomas A Kelly, MD;  [facility];  Service: Cardiovascular;  Laterality: N/A;  . Left heart cath Bilateral 09/16/2014    Procedure: LEFT HEART CATH;  Surgeon: Micheline ChapmanMichael D Cooper, MD;  [facility];  Service: Cardiovascular;  Laterality: Bilateral;   Family History  Problem Relation Age of Onset  . Cancer Mother     breast   History   Substance Use Topics  . Smoking status: Current Every Day Smoker -- 0.50 packs/day    Types: Cigarettes  . Smokeless tobacco: Never Used  . Alcohol Use: 30.0 oz/week    50 Cans of beer per week     Comment: (1) 40 oz, (2) cans beer daily    OB History    No data available     Review of Systems  Unable to perform ROS: Intubated  HENT: Ear pain: CAD, DM, recent stent placement.       Allergies  Atorvastatin; Hydrochlorothiazide w-triamterene; Penicillins; and Statins  Home Medications   Prior to Admission medications   Medication Sig Start Date End Date Taking? Authorizing Provider  apixaban (ELIQUIS) 2.5 MG TABS tablet Take 1 tablet (2.5 mg total) by mouth 2 (two) times daily. 09/11/14   Dwana MelenaBryan W Hager, PA-C  aspirin EC 81 MG EC tablet Take 1 tablet (81 mg total) by mouth daily. 09/11/14   Dwana MelenaBryan W Hager, PA-C  clopidogrel (PLAVIX) 75 MG tablet Take 1 tablet (75 mg total) by mouth daily. 09/11/14   Dwana MelenaBryan W Hager, PA-C  diltiazem (CARDIZEM CD) 300 MG 24 hr capsule Take 300 mg by mouth daily. 07/27/14   Historical Provider, MD  feeding supplement, RESOURCE BREEZE, (RESOURCE BREEZE) LIQD Take 1 Container by mouth 2 (two) times daily between meals. 09/11/14   Dwana MelenaBryan W Hager, PA-C  levothyroxine (SYNTHROID, LEVOTHROID) 50 MCG tablet TAKE 1 TABLET (50 MCG TOTAL)  BY MOUTH DAILY. 08/18/14   Terressa Koyanagi, DO  metFORMIN (GLUCOPHAGE) 500 MG tablet Take 500 mg by mouth 2 (two) times daily with a meal.    Historical Provider, MD  metoprolol (LOPRESSOR) 50 MG tablet Take 75 mg by mouth daily. 07/08/14   Historical Provider, MD  Multiple Vitamin (MULTI-VITAMINS PO) Take 1 tablet by mouth daily.     Historical Provider, MD  nitroGLYCERIN (NITROSTAT) 0.4 MG SL tablet Place 1 tablet (0.4 mg total) under the tongue every 5 (five) minutes x 3 doses as needed for chest pain. 09/11/14   Dwana Melena, PA-C  PARoxetine (PAXIL) 20 MG tablet Take 1 tablet (20 mg total) by mouth daily. PATIENT NEEDS OFFICE VISIT FOR  ADDITIONAL REFILLS    Chelle S Jeffery, PA-C  polyethylene glycol (MIRALAX / GLYCOLAX) packet Take 17 g by mouth daily.      Historical Provider, MD  pravastatin (PRAVACHOL) 80 MG tablet Take 1 tablet (80 mg total) by mouth daily at 6 PM. 09/11/14   Dwana Melena, PA-C  ramipril (ALTACE) 2.5 MG capsule Take 1 capsule (2.5 mg total) by mouth daily. 09/11/14   Dwana Melena, PA-C   BP 106/70 mmHg  Pulse 61  Temp(Src) 91.4 F (33 C) (Core (Comment))  Resp 20  Ht 5\' 2"  (1.575 m)  Wt 130 lb 8.2 oz (59.2 kg)  BMI 23.86 kg/m2  SpO2 100% Physical Exam  Constitutional: She appears well-developed and well-nourished. She has a sickly appearance. She appears ill. No distress. She is sedated and intubated. Cervical collar and backboard in place.  HENT:  Head: Normocephalic and atraumatic.  Eyes: Conjunctivae are normal. Pupils are equal, round, and reactive to light.  Neck: No tracheal deviation present.  Cardiovascular: Regular rhythm, normal heart sounds and intact distal pulses.  Tachycardia present.  Exam reveals no gallop and no friction rub.   No murmur heard. Pulses:      Radial pulses are 2+ on the right side, and 2+ on the left side.       Femoral pulses are 2+ on the right side.      Dorsalis pedis pulses are 1+ on the right side, and 1+ on the left side.  Pulmonary/Chest: Breath sounds normal. She is intubated. No respiratory distress. She has no wheezes. She has no rales.  Intubated   Abdominal: Soft. She exhibits no distension. There is no guarding.  Musculoskeletal: She exhibits no edema.  Neurological: She is unresponsive. GCS eye subscore is 4. GCS verbal subscore is 1. GCS motor subscore is 1.  Patient without movement to pain, noted to have some spontaneous movement of legs, facial grimace and cough  Skin: Skin is warm and dry. No rash noted. She is not diaphoretic. No erythema.  Nursing note and vitals reviewed.   ED Course  Procedures (including critical care time) Labs  Review Labs Reviewed  CBC WITH DIFFERENTIAL - Abnormal; Notable for the following:    WBC 16.8 (*)    RBC 2.93 (*)    Hemoglobin 8.9 (*)    HCT 26.7 (*)    Neutro Abs 11.9 (*)    All other components within normal limits  COMPREHENSIVE METABOLIC PANEL - Abnormal; Notable for the following:    CO2 12 (*)    Glucose, Bld 311 (*)    Calcium 8.3 (*)    Total Protein 4.2 (*)    Albumin 2.3 (*)    AST 110 (*)    ALT 83 (*)  GFR calc non Af Amer 55 (*)    GFR calc Af Amer 64 (*)    All other components within normal limits  PHOSPHORUS - Abnormal; Notable for the following:    Phosphorus 7.0 (*)    All other components within normal limits  PROTIME-INR - Abnormal; Notable for the following:    Prothrombin Time 17.2 (*)    All other components within normal limits  BRAIN NATRIURETIC PEPTIDE - Abnormal; Notable for the following:    B Natriuretic Peptide 1361.7 (*)    All other components within normal limits  TROPONIN I - Abnormal; Notable for the following:    Troponin I 3.62 (*)    All other components within normal limits  PROTIME-INR - Abnormal; Notable for the following:    Prothrombin Time 18.2 (*)    INR 1.50 (*)    All other components within normal limits  APTT - Abnormal; Notable for the following:    aPTT 66 (*)    All other components within normal limits  BLOOD GAS, ARTERIAL - Abnormal; Notable for the following:    pCO2 arterial 29.6 (*)    pO2, Arterial 165.0 (*)    Bicarbonate 16.7 (*)    Acid-base deficit 7.6 (*)    All other components within normal limits  COMPREHENSIVE METABOLIC PANEL - Abnormal; Notable for the following:    Chloride 113 (*)    CO2 17 (*)    Glucose, Bld 301 (*)    Calcium 6.6 (*)    Total Protein 4.1 (*)    Albumin 2.0 (*)    AST 179 (*)    ALT 143 (*)    GFR calc non Af Amer 64 (*)    GFR calc Af Amer 75 (*)    All other components within normal limits  GLUCOSE, CAPILLARY - Abnormal; Notable for the following:     Glucose-Capillary 271 (*)    All other components within normal limits  GLUCOSE, CAPILLARY - Abnormal; Notable for the following:    Glucose-Capillary 348 (*)    All other components within normal limits  GLUCOSE, CAPILLARY - Abnormal; Notable for the following:    Glucose-Capillary 306 (*)    All other components within normal limits  CBC WITH DIFFERENTIAL - Abnormal; Notable for the following:    WBC 24.4 (*)    RBC 3.04 (*)    Hemoglobin 8.9 (*)    HCT 26.8 (*)    Neutrophils Relative % 88 (*)    Neutro Abs 21.5 (*)    Lymphocytes Relative 5 (*)    Monocytes Absolute 1.7 (*)    All other components within normal limits  I-STAT CHEM 8, ED - Abnormal; Notable for the following:    Glucose, Bld 310 (*)    Hemoglobin 10.2 (*)    HCT 30.0 (*)    All other components within normal limits  I-STAT CG4 LACTIC ACID, ED - Abnormal; Notable for the following:    Lactic Acid, Venous 9.53 (*)    All other components within normal limits  I-STAT ARTERIAL BLOOD GAS, ED - Abnormal; Notable for the following:    pH, Arterial 7.134 (*)    pO2, Arterial 51.0 (*)    Bicarbonate 12.0 (*)    Acid-base deficit 16.0 (*)    All other components within normal limits  POCT I-STAT TROPONIN I - Abnormal; Notable for the following:    Troponin i, poc 4.99 (*)    All other components within normal limits  POCT I-STAT, CHEM 8 - Abnormal; Notable for the following:    Glucose, Bld 297 (*)    Hemoglobin 9.5 (*)    HCT 28.0 (*)    All other components within normal limits  POCT I-STAT, CHEM 8 - Abnormal; Notable for the following:    Glucose, Bld 352 (*)    Hemoglobin 9.9 (*)    HCT 29.0 (*)    All other components within normal limits  POCT I-STAT, CHEM 8 - Abnormal; Notable for the following:    Glucose, Bld 347 (*)    Calcium, Ion 1.11 (*)    Hemoglobin 10.5 (*)    HCT 31.0 (*)    All other components within normal limits  URINE CULTURE  MAGNESIUM  TSH  MAGNESIUM  COMPREHENSIVE METABOLIC  PANEL  MAGNESIUM  TROPONIN I  TROPONIN I  TROPONIN I  CBC WITH DIFFERENTIAL  PROTIME-INR  BASIC METABOLIC PANEL  CBC  HEPARIN LEVEL (UNFRACTIONATED)  TROPONIN I  TROPONIN I  BASIC METABOLIC PANEL  BASIC METABOLIC PANEL  BASIC METABOLIC PANEL  BASIC METABOLIC PANEL  BASIC METABOLIC PANEL  PROTIME-INR  APTT  TROPONIN I  Rosezena Sensor, ED    Imaging Review Dg Chest Port 1 View  09/14/14   CLINICAL DATA:  Central line evaluation.  Intubated patient.  EXAM: PORTABLE CHEST - 1 VIEW  COMPARISON:  Radiograph 09-14-14  FINDINGS: Endotracheal tube and NG tube are unchanged. Interval placement of a right central venous line with tip in the distal SVC. No pneumothorax.  Stable cardiac silhouette. There is diffuse bilateral airspace disease more dense on the right. No pleural fluid.  IMPRESSION: 1. Interval placement right central venous line without complication. 2. Diffuse airspace disease suggesting pulmonary edema, not changed from prior. 3. Other support apparatus stable.   Electronically Signed   By: Genevive Bi M.D.   On: 09-14-14 19:31   Dg Chest Portable 1 View  09-14-2014   CLINICAL DATA:  Patient status post CPR.  History of COPD.  EXAM: PORTABLE CHEST - 1 VIEW  COMPARISON:  09/08/2014  FINDINGS: ET tube terminates in the mid trachea. Enteric tube courses inferior to the diaphragm. Patient is rotated. Tortuosity of the thoracic aorta. Stable cardiac contours. Diffuse bilateral heterogeneous pulmonary opacities. Multiple irregular lucencies projecting over the left upper and lower hemi thorax.  IMPRESSION: Multiple lucencies projecting over the left hemithorax are nonspecific on portable chest radiograph. While these may be secondary to skin folds, pneumothorax is not excluded. Recommend attention on repeat radiograph.  Diffuse bilateral heterogeneous pulmonary opacities, favored to represent edema.  These results will be called to the ordering clinician or representative by the  Radiologist Assistant, and communication documented in the PACS or zVision Dashboard.   Electronically Signed   By: Annia Belt M.D.   On: 09-14-14 16:53   Ct Portable Head W/o Cm  09-14-14   CLINICAL DATA:  Initial evaluation for cardiac arrest and CPR for 20 min, concern for acute hypoxic encephalopathy  EXAM: CT HEAD WITHOUT CONTRAST  TECHNIQUE: Contiguous axial images were obtained from the base of the skull through the vertex without intravenous contrast.  COMPARISON:  None.  FINDINGS: Endotracheal tube and orogastric tube noted.  Moderate diffuse atrophy. No abnormal attenuation to suggest acute vascular territory infarct. No hemorrhage or extra-axial fluid. No hydrocephalus.  IMPRESSION: Currently no evidence of acute abnormality although findings of anoxic injury may require up to 12 hr to show CT manifestation. Consider MRI or follow-up CT scan in approximately  12 hr to re-evaluate.   Electronically Signed   By: Esperanza Heir M.D.   On: 09-25-14 21:27     EKG Interpretation None     Vent. rate 141 BPM PR interval * ms QRS duration 118 ms QT/QTc 310/475 ms P-R-T axes -1 0 77  Infero-Lateral STEMI  MDM   Final diagnoses:  No CPR or defibrillation, use all other resuscitative measures  Encounter for central line care  Cardiac arrest   74 year old female with a history of diabetes, hypertension, hyperlipidemia, recent inferior STEMI s/p revascularization, atrial fibrillation presents with V. fib arrest in the field with return of spontaneous circulation.  Patient had approximately 10 minutes of down time EMS arrival was in ventricular fibrillation. She received defibrillation and rhythm converted to asystole. 10-15 minutes of CPR were performed with 2 rounds of epinephrine and she had return of spontaneous circulation. She was intubated.   On arrival to our emergency department she had a 7.0 ETT with bilateral breath sounds, was hypertensive and had an EKG showing inferior and  lateral ST elevations. Code STEMI and hypothermic protocol were initiated. Pt without specific reaction to pain but noted to have coughing, spontaneous movement.  Patient was initially given 150 mg of amiodarone followed by a drip and given propofol for sedation, however developed hypotension prior to transfer to the Cath Lab and both propofol and amiodarone were discontinued.  Her MAPs improved to a level stable for transport after discontinuation of drips and given concern of stent occlusion causing ischemia pt transported to cath lab for continued care. She was given ASA, started on hypothermic protocol prior to transfer.     Rhae Lerner, MD 09/13/14 1610  Rolland Porter, MD 09/22/14 [phone]

## 2014-09-12 NOTE — Consult Note (Signed)
PULMONARY / CRITICAL CARE MEDICINE   Name: [name] MRN: [id number] DOB: 1941-02-25    ADMISSION DATE:  09/20/2014 CONSULTATION DATE:  1/9  REFERRING MD :  Excell Seltzerooper  CHIEF COMPLAINT:  Cardiac arrest   INITIAL PRESENTATION:  74 year old female w/ sig h/o ETOH, CAF and recent MI. Discharged 1/8 s/p PCI w/ DES to RCA and triple drug therapy. Returned to ER s/p VF arrest on 1/9. Down time estimated 20 minutes to ROSC. STAT to cath lab: no sig change from recent cath. PCCM asked to see for hypothermia.    STUDIES:  Left heart cath 1/9: 1. Severe stenosis of the LAD and left circumflex without significant change from the previous study 2. Widely patent RCA with no significant stenosis of the recently placed stent 3. Mild LV systolic dysfunction with inferior wall akinesis and an estimated LVEF of 45-50%.  SIGNIFICANT EVENTS: Hypothermia protocol    HISTORY OF PRESENT ILLNESS:    74 y.o. female w/ PMHx significant for CAD with recent hospitalization for an inferior wall ST elevation MI. Her hospitalization was complicated by heart block requiring temporary pacemaker placement. She also was noted to be anemic. The patient has a history of alcoholism. She has chronic atrial fibrillation and has not been anticoagulated because of alcohol use and refusal to take warfarin. She was discharged after PCI w/ DES on triple therapy with aspirin, Plavix, and Eliquis. She returned to the ED on 1/9 w/ family reported she did not feel well. She was at home with her family this afternoon and she had a cardiac arrest. EMS was called. CPR was performed and on EMS arrival the patient was in ventricular fibrillation. She received at least one shock. She apparently had a total of 20 minutes of CPR in total. Went to cath lab, her Coronary arteries were  W/out sig change from prior catheterization. EF had dropped from 55-60% during last admit to 45-50%. PCCM was asked to a/w supportive critical care. She was initiated  on the hypothermia protocol.    PAST MEDICAL HISTORY :   has a past medical history of PAF (paroxysmal atrial fibrillation); COPD (chronic obstructive pulmonary disease); Hyperlipidemia; Hypothyroidism; Arthritis; Diabetes mellitus; History of EMG (11/00); Ventricular fibrillation (09/04/2014); and Cardiac arrest (09/15/2014).  has past surgical history that includes Ankle fracture surgery; thyroid colloid cyst  (12/00); Cardiac catheterization (06/16/05); and left heart catheterization with coronary angiogram (N/A, 09/05/2014). Prior to Admission medications   Medication Sig Start Date End Date Taking? Authorizing Provider  apixaban (ELIQUIS) 2.5 MG TABS tablet Take 1 tablet (2.5 mg total) by mouth 2 (two) times daily. 09/11/14   Dwana MelenaBryan W Hager, PA-C  aspirin EC 81 MG EC tablet Take 1 tablet (81 mg total) by mouth daily. 09/11/14   Dwana MelenaBryan W Hager, PA-C  clopidogrel (PLAVIX) 75 MG tablet Take 1 tablet (75 mg total) by mouth daily. 09/11/14   Dwana MelenaBryan W Hager, PA-C  feeding supplement, RESOURCE BREEZE, (RESOURCE BREEZE) LIQD Take 1 Container by mouth 2 (two) times daily between meals. 09/11/14   Dwana MelenaBryan W Hager, PA-C  levothyroxine (SYNTHROID, LEVOTHROID) 50 MCG tablet TAKE 1 TABLET (50 MCG TOTAL) BY MOUTH DAILY. 08/18/14   Terressa KoyanagiHannah R Kim, DO  metFORMIN (GLUCOPHAGE) 500 MG tablet Take 500 mg by mouth 2 (two) times daily with a meal.    Historical Provider, MD  Multiple Vitamin (MULTI-VITAMINS PO) Take 1 tablet by mouth daily.     Historical Provider, MD  nitroGLYCERIN (NITROSTAT) 0.4 MG SL tablet Place  1 tablet (0.4 mg total) under the tongue every 5 (five) minutes x 3 doses as needed for chest pain. 09/11/14   Dwana Melena, PA-C  PARoxetine (PAXIL) 20 MG tablet Take 1 tablet (20 mg total) by mouth daily. PATIENT NEEDS OFFICE VISIT FOR ADDITIONAL REFILLS    Chelle S Jeffery, PA-C  polyethylene glycol (MIRALAX / GLYCOLAX) packet Take 17 g by mouth daily.      Historical Provider, MD  pravastatin (PRAVACHOL) 80 MG tablet  Take 1 tablet (80 mg total) by mouth daily at 6 PM. 09/11/14   Dwana Melena, PA-C  ramipril (ALTACE) 2.5 MG capsule Take 1 capsule (2.5 mg total) by mouth daily. 09/11/14   Dwana Melena, PA-C   Allergies  Allergen Reactions  . Atorvastatin Other (See Comments)    Muscle aches  . Hydrochlorothiazide W-Triamterene     REACTION: palpitations  . Penicillins     REACTION: urticaria (hives)  . Statins     Subjective myalgia    FAMILY HISTORY:  indicated that her mother is deceased. She indicated that her father is deceased.  SOCIAL HISTORY:  reports that she has been smoking Cigarettes.  She has been smoking about 0.50 packs per day. She has never used smokeless tobacco. She reports that she drinks about 30.0 oz of alcohol per week. She reports that she does not use illicit drugs.  REVIEW OF SYSTEMS:  Unable  SUBJECTIVE:  Sedated on vent  VITAL SIGNS: Temp:  [95.8 F (35.4 C)-97.2 F (36.2 C)] 95.8 F (35.4 C) (01/09 1600) Pulse Rate:  [89-148] 89 (01/09 1630) Resp:  [20-26] 20 (01/09 1611) BP: (65-166)/(41-102) 79/57 mmHg (01/09 1618) SpO2:  [98 %-100 %] 100 % (01/09 1611) FiO2 (%):  [100 %] 100 % (01/09 1608) Weight:  [53.978 kg (119 lb)] 53.978 kg (119 lb) (01/09 1545) HEMODYNAMICS:   VENTILATOR SETTINGS: Vent Mode:  [-] PRVC FiO2 (%):  [100 %] 100 % Set Rate:  [18 bmp] 18 bmp Vt Set:  [490 mL] 490 mL PEEP:  [5 cmH20] 5 cmH20 Plateau Pressure:  [27 cmH20] 27 cmH20 INTAKE / OUTPUT: No intake or output data in the 24 hours ending 09/18/2014 1730  PHYSICAL EXAMINATION: General:  Unresponsive s/p arrest. No w/d to pain or noxious stim  Neuro:  GCS 3. Unresponsive  HEENT:  Orally intubated. + JVD Cardiovascular:  Tachy irreg irreg  Weak posterior tib pulse audible by doppler  Lungs:  Diffuse rhonchi w/ tactile fremitus  Abdomen:  Soft, non-tender + bowel sounds that are hypoactive  Musculoskeletal:  Intact  Skin:  Dry, cool and mottled.  LABS:  CBC  Recent Labs Lab  09/10/14 0533 09/11/14 0521 09/22/2014 1543 09/17/2014 1619  WBC 8.6 8.8 16.8*  --   HGB 7.9* 8.0* 8.9* 10.2*  HCT 23.0* 23.9* 26.7* 30.0*  PLT 189 216 294  --    Coag's  Recent Labs Lab 09/05/14 1735  09/10/14 0533 09/11/14 0521 10/02/2014 1543  APTT >200*  --   --   --   --   INR 7.45*  < > 1.30 1.30 1.39  < > = values in this interval not displayed. BMET  Recent Labs Lab 09/08/14 0233 09/09/14 0235 09/11/14 0521 09/28/2014 1619  NA 136 135 136 135  K 3.5 3.8 3.4* 5.1  CL 110 110 109 109  CO2 20 17* 18*  --   BUN 36* 30* 12 11  CREATININE 1.01 0.95 0.79 0.90  GLUCOSE 206* 259* 160* 310*  Electrolytes  Recent Labs Lab 09/05/14 1735  09/08/14 0233 09/09/14 0235 09/11/14 0521  CALCIUM 7.8*  < > 8.0* 8.1* 8.3*  MG 1.9  --   --   --   --   < > = values in this interval not displayed. Sepsis Markers  Recent Labs Lab 2014-09-21 1619  LATICACIDVEN 9.53*   ABG  Recent Labs Lab 09/21/2014 1555  PHART 7.134*  PCO2ART 35.5  PO2ART 51.0*   Liver Enzymes  Recent Labs Lab 09/06/14 0605  AST 91*  ALT 46*  ALKPHOS 40  BILITOT 0.8  ALBUMIN 2.3*   Cardiac Enzymes  Recent Labs Lab 09/05/14 1735 09/06/14 0005 09/06/14 0605  TROPONINI 68.12* >80.00* 66.50*   Glucose  Recent Labs Lab 09/10/14 0606 09/10/14 1124 09/10/14 1716 09/10/14 2109 09/11/14 0607 09/11/14 1151  GLUCAP 185* 142* 112* 160* 148* 129*    Imaging No results found.   ASSESSMENT / PLAN:  PULMONARY OETT 1/9>> A: Acute respiratory failure s/p cardiopulmonary arrest.  Diffuse bilateral infiltrates presume PNA but could reflect aspiration  P:   Full vent support Sedation/NMB per hypothermia protocol  See ID section  F/u PCXR and abg   CARDIOVASCULAR CVL A:  VF arrest CAD s/p recent stent to RCA, w/ significant disease to LAD Systolic heart failure w/ pulmonary edema  P:  Triple drug therapy w/ ASA, plavix and Eliquis  Cont supportive medical rx per cards    RENAL A:   Lactic acidosis [place] hyperkalemia  P:   F/u chemistry s/p lasix  Repeat chemistry later this evening Strict I&O  GASTROINTESTINAL A:  No acute  P:   PPI for SUP Assess for nutritional support 1/10 after we stop hypothermia   HEMATOLOGIC A:   Anemia w/ out s/sx of bleeding  P:  Trend cbc  Cont antiplatelet therapy and heparin per cards  INFECTIOUS A:  Possible aspiration  P:   Sputum 1/9>>> levaquin 1/9 (PCN allergy)>>>  ENDOCRINE A:   Hypothyroidism  P:   Continue thyroid supplementation   NEUROLOGIC A:   At risk for hypoxic ischemic injury s/p arrest  P:   RASS goal: -4 Hypothermia protocol  Port CT head   FAMILY  - Updates    TODAY'S SUMMARY:  74 year old female w/ sig h/o ETOH, CAF and recent MI. Discharged 1/8 s/p PCI w/ DES to RCA and triple drug therapy. Returned to ER s/p VF arrest on 1/9. Down time estimated 20 minutes to ROSC. STAT to cath lab: no sig change from recent cath. PCCM asked to see for hypothermia. Will place CVL and continue supportive care.   Simonne Martinet ACNP-BC [facility] Pulmonary/Critical Care Pager # [phone] OR # [phone] if no answer 2014/09/21, 5:30 PM   Attending Note:  I have examined patient, reviewed labs, studies and notes. I have discussed the case with Kreg Shropshire, and I agree with the data and plans as amended above. She has a hx CAD, admitted after CPR for VT arrest 1/9. She is unresponsive on exam. Her l heart cath does not show evidence for acute in-stent restenosis. We will initiate hypothermia and support until we can assess MS. Independent critical care time is 60 minutes.   Levy Pupa, MD, PhD 09/13/2014, 3:02 PM [facility] [phone] or if no answer [phone]

## 2014-09-12 NOTE — Progress Notes (Signed)
ANTICOAGULATION CONSULT NOTE - Initial Consult  Pharmacy Consult for Heparin Indication:  ACS/STEMI  Allergies  Allergen Reactions  . Atorvastatin Other (See Comments)    Muscle aches  . Hydrochlorothiazide W-Triamterene     REACTION: palpitations  . Penicillins     REACTION: urticaria (hives)  . Statins     Subjective myalgia   Patient Measurements: Height: 5\' 4"  (162.6 cm) Weight: 119 lb (53.978 kg) IBW/kg (Calculated) : 54.7 Heparin Dosing Weight: 54  Vital Signs: Temp: 95.8 F (35.4 C) (01/09 1600) Temp Source: Rectal (01/09 1600) BP: 79/57 mmHg (01/09 1618) Pulse Rate: 104 (01/09 1608)  Labs:  Recent Labs  09/10/14 0533 09/11/14 0521 09/29/2014 1543 09/23/2014 1619  HGB 7.9* 8.0* 8.9* 10.2*  HCT 23.0* 23.9* 26.7* 30.0*  PLT 189 216 294  --   LABPROT 16.3* 16.4* 17.2*  --   INR 1.30 1.30 1.39  --   CREATININE  --  0.79  --  0.90   Estimated Creatinine Clearance: 46 mL/min (by C-G formula based on Cr of 0.9).  Medical History: Past Medical History  Diagnosis Date  . PAF (paroxysmal atrial fibrillation)   . COPD (chronic obstructive pulmonary disease)   . Hyperlipidemia   . Hypothyroidism   . Arthritis   . Diabetes mellitus   . History of EMG 11/00    negative  . Ventricular fibrillation 09/14/2014  . Cardiac arrest 09/28/2014   Medications:  Prescriptions prior to admission  Medication Sig Dispense Refill Last Dose  . apixaban (ELIQUIS) 2.5 MG TABS tablet Take 1 tablet (2.5 mg total) by mouth 2 (two) times daily. 60 tablet 11   . aspirin EC 81 MG EC tablet Take 1 tablet (81 mg total) by mouth daily.     . clopidogrel (PLAVIX) 75 MG tablet Take 1 tablet (75 mg total) by mouth daily. 30 tablet 11   . feeding supplement, RESOURCE BREEZE, (RESOURCE BREEZE) LIQD Take 1 Container by mouth 2 (two) times daily between meals. 60 Container 5   . levothyroxine (SYNTHROID, LEVOTHROID) 50 MCG tablet TAKE 1 TABLET (50 MCG TOTAL) BY MOUTH DAILY. 90 tablet 1 09/04/2014 at  Unknown time  . metFORMIN (GLUCOPHAGE) 500 MG tablet Take 500 mg by mouth 2 (two) times daily with a meal.   09/04/2014 at Unknown time  . Multiple Vitamin (MULTI-VITAMINS PO) Take 1 tablet by mouth daily.    Past Week at Unknown time  . nitroGLYCERIN (NITROSTAT) 0.4 MG SL tablet Place 1 tablet (0.4 mg total) under the tongue every 5 (five) minutes x 3 doses as needed for chest pain. 25 tablet 12   . PARoxetine (PAXIL) 20 MG tablet Take 1 tablet (20 mg total) by mouth daily. PATIENT NEEDS OFFICE VISIT FOR ADDITIONAL REFILLS 90 tablet 0 09/04/2014 at Unknown time  . polyethylene glycol (MIRALAX / GLYCOLAX) packet Take 17 g by mouth daily.     Past Month at Unknown time  . pravastatin (PRAVACHOL) 80 MG tablet Take 1 tablet (80 mg total) by mouth daily at 6 PM. 30 tablet 5   . ramipril (ALTACE) 2.5 MG capsule Take 1 capsule (2.5 mg total) by mouth daily. 30 capsule 5     Assessment: 73yo female admitted from home s/p cardiac arrest with CPR.  She was just here and underwent cardiac cath with PCI and DES placement on 09/05/2014.  She was discharged on 09/11/2014 on ASA, Plavix and Apixaban.  We have been asked to start her on IV heparin this evening without a  bolus while her cardiac work up continues.  Goal of Therapy:  Heparin level 0.3-0.7 units/ml Monitor platelets by anticoagulation protocol: Yes     Plan:  - Heparin at 800 units/hr to start at 22:00PM - Check heparin level with AM labs and daily - Monitor for bleeding complications.  Nadara Mustard, PharmD., MS Clinical Pharmacist Pager:  [phone] Thank you for allowing pharmacy to be part of this patients care team. 2014-09-24,5:17 PM

## 2014-09-12 NOTE — Procedures (Signed)
Central Venous Catheter Insertion Procedure Note [name] [id number] 05/21/1941  Procedure: Insertion of Central Venous Catheter Indications: Assessment of intravascular volume, Drug and/or fluid administration and Frequent blood sampling  Procedure Details Consent: Unable to obtain consent because of emergent medical necessity. Time Out: Verified patient identification, verified procedure, site/side was marked, verified correct patient position, special equipment/implants available, medications/allergies/relevent history reviewed, required imaging and test results available.  Performed Real time US was used to ID and cannulate the vessel    Maximum sterile technique was used including antiseptics, cap, gloves, gown, hand hygiene, mask and sheet. Skin prep: Chlorhexidine; local anesthetic administered A antimicrobial bonded/coated triple lumen catheter was placed in the right internal jugular vein using the Seldinger technique.  Evaluation Blood flow good Complications: No apparent complications Patient did tolerate procedure well. Chest X-ray ordered to verify placement.  CXR: pending.  BABCOCK,PETE 09/20/2014, 6:43 PM  Levy Pupaobert Byrum, MD, PhD 09/13/2014, 3:04 PM [facility] [phone] or if no answer [phone]

## 2014-09-12 NOTE — ED Notes (Signed)
Stopped per verbal order EDP

## 2014-09-12 NOTE — H&P (Signed)
History and Physical  Patient ID: [name] MRN: [id number], SOB: 08-24-1941 74 y.o. Date of Encounter: 09/15/2014, 4:14 PM  Primary Physician: [name] Primary Cardiologist: [name]  Chief Complaint: Cardiac Arrest  HPI: 74 y.o. female w/ PMHx significant for CAD with recent MI who [facility]2014 after cardiac arrest at home. The patient had a recent hospitalization for an inferior wall ST elevation MI. Her hospitalization was complicated by heart block requiring temporary pacemaker placement. She also was noted to be anemic. The patient has a history of alcoholism. She has chronic atrial fibrillation and has not been anticoagulated because of alcohol use and refusal to take warfarin. However, she was discharged after PCI on triple therapy with aspirin, Plavix, and Eliquis. She underwent primary PCI of the right coronary artery using a drug-eluting stent platform.  There was question about consideration of staged intervention on the proximal LAD and proximal AV circumflex for residual coronary artery disease. Ultimately she was discharged home on medical therapy and had improved throughout the hospitalization with better strength documented. An echocardiogram showed essentially normal LV function with an ejection fraction of 55-60%. Severe LVH was noted.  Today the patient did not feel well. She was at home with her family this afternoon and she had a cardiac arrest. EMS was called. CPR was performed and on EMS arrival the patient was in ventricular fibrillation. She received at least one shock. She apparently had a total of 20 minutes of CPR in total.  On my evaluation, the patient is intubated. She did require sedation. She is hypertensive in sinus tachycardia. EKG demonstrates inferior ST segment elevation consistent with acute injury.  Past Medical History  Diagnosis Date  . PAF (paroxysmal atrial fibrillation)   . COPD (chronic  obstructive pulmonary disease)   . Hyperlipidemia   . Hypothyroidism   . Arthritis   . Diabetes mellitus   . History of EMG 11/00    negative     Surgical History:  Past Surgical History  Procedure Laterality Date  . Ankle fracture surgery      left  . Thyroid colloid cyst   12/00  . Cardiac catheterization  06/16/05    Lmain 20%, LAD 20/40%, D1 & D2 30%, CFX 30%, RCA 50%, EF 75%   . Left heart catheterization with coronary angiogram N/A 09/05/2014    Procedure: LEFT HEART CATHETERIZATION WITH CORONARY ANGIOGRAM;  Surgeon: Erin Biharihomas A Kelly, MD;  [facility];  Service: Cardiovascular;  Laterality: N/A;     Home Meds: Prior to Admission medications   Medication Sig Start Date End Date Taking? Authorizing Provider  apixaban (ELIQUIS) 2.5 MG TABS tablet Take 1 tablet (2.5 mg total) by mouth 2 (two) times daily. 09/11/14   Erin MelenaBryan W Hager, PA-C  aspirin EC 81 MG EC tablet Take 1 tablet (81 mg total) by mouth daily. 09/11/14   Erin MelenaBryan W Hager, PA-C  clopidogrel (PLAVIX) 75 MG tablet Take 1 tablet (75 mg total) by mouth daily. 09/11/14   Erin MelenaBryan W Hager, PA-C  feeding supplement, RESOURCE BREEZE, (RESOURCE BREEZE) LIQD Take 1 Container by mouth 2 (two) times daily between meals. 09/11/14   Erin MelenaBryan W Hager, PA-C  levothyroxine (SYNTHROID, LEVOTHROID) 50 MCG tablet TAKE 1 TABLET (50 MCG TOTAL) BY MOUTH DAILY. 08/18/14   Erin KoyanagiHannah R Kim, Brady  metFORMIN (GLUCOPHAGE) 500 MG tablet Take 500 mg by mouth 2 (two) times daily with a meal.    Historical  Provider, MD  Multiple Vitamin (MULTI-VITAMINS PO) Take 1 tablet by mouth daily.     Historical Provider, MD  nitroGLYCERIN (NITROSTAT) 0.4 MG SL tablet Place 1 tablet (0.4 mg total) under the tongue every 5 (five) minutes x 3 doses as needed for chest pain. 09/11/14   Erin Melena, PA-C  PARoxetine (PAXIL) 20 MG tablet Take 1 tablet (20 mg total) by mouth daily. PATIENT NEEDS OFFICE VISIT FOR ADDITIONAL REFILLS    Erin S Jeffery, PA-C  polyethylene glycol (MIRALAX  / GLYCOLAX) packet Take 17 g by mouth daily.      Historical Provider, MD  pravastatin (PRAVACHOL) 80 MG tablet Take 1 tablet (80 mg total) by mouth daily at 6 PM. 09/11/14   Erin Melena, PA-C  ramipril (ALTACE) 2.5 MG capsule Take 1 capsule (2.5 mg total) by mouth daily. 09/11/14   Erin Melena, PA-C    Allergies:  Allergies  Allergen Reactions  . Atorvastatin Other (See Comments)    Muscle aches  . Hydrochlorothiazide W-Triamterene     REACTION: palpitations  . Penicillins     REACTION: urticaria (hives)  . Statins     Subjective myalgia    History   Social History  . Marital Status: Married    Spouse Name: N/A    Number of Children: N/A  . Years of Education: N/A   Occupational History  . Not on file.   Social History Main Topics  . Smoking status: Current Every Day Smoker -- 0.50 packs/day    Types: Cigarettes  . Smokeless tobacco: Never Used  . Alcohol Use: 30.0 oz/week    50 Cans of beer per week     Comment: (1) 40 oz, (2) cans beer daily   . Drug Use: No  . Sexual Activity: No   Other Topics Concern  . Not on file   Social History Narrative     Family History  Problem Relation Age of Onset  . Cancer Mother     breast    Review of Systems: Unable to obtain as the patient is unconscious.  Physical Exam: Blood pressure 65/41, pulse 104, temperature 95.8 F (35.4 C), temperature source Rectal, resp. rate 20, height  (1.626 m), weight 119 lb (53.978 kg), SpO2 100 %. General: Chronically ill-appearing, intubated and sedated. HEENT: Normocephalic, atraumatic Neck: Carotids 2+ without bruits. JVP normal Lungs: Coarse breath sounds anteriorly with good air movement Heart: Tachycardic and irregular with normal S1 and S2. No murmurs, rubs, or gallops appreciated. Distant heart sounds. Abdomen: Soft, non-distended with normoactive bowel sounds.  No obvious abdominal masses. Back: No spinal deformity noted Extremities: No clubbing, cyanosis, or edema.     Neuro: Unresponsive at present Psych:  Unable to assess    Labs:   Lab Results  Component Value Date   WBC 8.8 09/11/2014   HGB 8.0* 09/11/2014   HCT 23.9* 09/11/2014   MCV 85.7 09/11/2014   PLT 216 09/11/2014    Recent Labs Lab 09/06/14 0605  09/11/14 0521  NA 134*  < > 136  K 3.6  < > 3.4*  CL 109  < > 109  CO2 21  < > 18*  BUN 51*  < > 12  CREATININE 1.16*  < > 0.79  CALCIUM 7.1*  < > 8.3*  PROT 4.3*  --   --   BILITOT 0.8  --   --   ALKPHOS 40  --   --   ALT 46*  --   --  AST 91*  --   --   GLUCOSE 167*  < > 160*  < > = values in this interval not displayed. No results for input(s): CKTOTAL, CKMB, TROPONINI in the last 72 hours. Lab Results  Component Value Date   CHOL 192 09/06/2014   HDL 53 09/06/2014   LDLCALC 110* 09/06/2014   TRIG 144 09/06/2014   No results found for: DDIMER  Radiology/Studies:  Dg Chest Port 1 View  09/08/2014   CLINICAL DATA:  Temporary pacer.  EXAM: PORTABLE CHEST - 1 VIEW  COMPARISON:  09/07/2014.  FINDINGS: Mediastinum hilar structures are normal. Tapering pacer noted projected over right ventricle. Stable cardiomegaly. Pulmonary vascularity normal. Low lung volumes with mild basilar atelectasis. No pleural effusion or pneumothorax.  IMPRESSION: 1. Temporary pacer noted in stable position projected over the right ventricle. Stable cardiomegaly. No CHF. 2. Low lung volumes with mild basilar atelectasis .   Electronically Signed   By: Maisie Fus  Register   On: 09/08/2014 07:28   Dg Chest Port 1 View  09/07/2014   CLINICAL DATA:  Left posterior chest discomfort. Temporary pacemaker.  EXAM: PORTABLE CHEST - 1 VIEW  COMPARISON:  09/05/2014  FINDINGS: Temporary pacing lead projects in the right ventricle, unchanged.  Cardiac silhouette is mildly enlarged. No mediastinal or hilar masses or evidence of adenopathy.  Clear lungs.  No pleural effusion or pneumothorax.  Bony thorax is intact.  IMPRESSION: 1. No acute cardiopulmonary disease. No change  from the prior study.   Electronically Signed   By: Amie Portland M.D.   On: 09/07/2014 09:40   Dg Chest Portable 1 View  09/05/2014   CLINICAL DATA:  Acute myocardial infarction.  EXAM: PORTABLE CHEST - 1 VIEW  COMPARISON:  04/19/2013  FINDINGS: Heart size and pulmonary vascularity are within normal limits in the lungs are clear. No acute osseous abnormality. Slight tortuosity and calcification of the thoracic aorta. Temporary pacing lead in place.  IMPRESSION: Temporary pacing lead in place.  No acute abnormalities.   Electronically Signed   By: Geanie Cooley M.D.   On: 09/05/2014 19:01     EKG: Atrial flutter with RVR, heart rate 140 bpm, acute inferolateral STEMI  ASSESSMENT AND PLAN:  1. Out of hospital ventricular fibrillation cardiac arrest 2. Recent acute inferior MI treated with primary PCI, now with VF arrest and evidence of acute inferolateral STEMI 3. Atrial fibrillation/flutter 4. Severe protein calorie malnutrition 5. Alcoholism 6. Tobacco abuse 7. Medical noncompliance 8. Type 2 diabetes  The patient is critically ill. I have tried to call her home but cannot get through to her family. Apparently her husband and son are both sick as well. Through chart review, she appears to have poor functional status. She has had a VF arrest after recent PCI/inferior MI now with evidence of acute STEMI on EKG. I've reviewed her post-PCI EKG's and all are paced, so it's difficult to know if she had ST resolution following PCI. However, with her clinical presentation, it's clear that there is a high likelihood of stent thrombosis causing an acute inferior MI complicated by VF arrest. Will proceed with emergency cardiac cath and possible PCI. Further plans pending cath findings and early clinical course. Overall prognosis is extremely guarded based on poor background health and severe acute illness with out-of-hospital arrest.  Signed, Tonny Bollman MD 09/20/2014, 4:14 PM

## 2014-09-13 ENCOUNTER — Other Ambulatory Visit: Payer: Self-pay | Admitting: Family Medicine

## 2014-09-13 ENCOUNTER — Inpatient Hospital Stay (HOSPITAL_COMMUNITY): Payer: Medicare Other

## 2014-09-13 DIAGNOSIS — J9690 Respiratory failure, unspecified, unspecified whether with hypoxia or hypercapnia: Secondary | ICD-10-CM | POA: Insufficient documentation

## 2014-09-13 DIAGNOSIS — I2119 ST elevation (STEMI) myocardial infarction involving other coronary artery of inferior wall: Secondary | ICD-10-CM | POA: Insufficient documentation

## 2014-09-13 DIAGNOSIS — J9600 Acute respiratory failure, unspecified whether with hypoxia or hypercapnia: Secondary | ICD-10-CM

## 2014-09-13 DIAGNOSIS — I2510 Atherosclerotic heart disease of native coronary artery without angina pectoris: Secondary | ICD-10-CM | POA: Insufficient documentation

## 2014-09-13 LAB — BASIC METABOLIC PANEL
ANION GAP: 8 (ref 5–15)
Anion gap: 8 (ref 5–15)
Anion gap: 7 (ref 5–15)
Anion gap: 5 (ref 5–15)
Anion gap: 8 (ref 5–15)
Anion gap: 11 (ref 5–15)
Anion gap: 12 (ref 5–15)
BUN: 13 mg/dL (ref 6–23)
BUN: 14 mg/dL (ref 6–23)
BUN: 16 mg/dL (ref 6–23)
BUN: 16 mg/dL (ref 6–23)
BUN: 15 mg/dL (ref 6–23)
BUN: 17 mg/dL (ref 6–23)
BUN: 17 mg/dL (ref 6–23)
CALCIUM: 7.7 mg/dL — AB (ref 8.4–10.5)
CALCIUM: 8 mg/dL — AB (ref 8.4–10.5)
CHLORIDE: 113 meq/L — AB (ref 96–112)
CO2: 18 mmol/L — ABNORMAL LOW (ref 19–32)
CO2: 15 mmol/L — AB (ref 19–32)
CO2: 16 mmol/L — AB (ref 19–32)
CO2: 17 mmol/L — AB (ref 19–32)
CO2: 15 mmol/L — AB (ref 19–32)
CO2: 18 mmol/L — AB (ref 19–32)
CO2: 17 mmol/L — AB (ref 19–32)
CREATININE: 0.97 mg/dL (ref 0.50–1.10)
CREATININE: 0.87 mg/dL (ref 0.50–1.10)
CREATININE: 0.83 mg/dL (ref 0.50–1.10)
Calcium: 7.2 mg/dL — ABNORMAL LOW (ref 8.4–10.5)
Calcium: 7.6 mg/dL — ABNORMAL LOW (ref 8.4–10.5)
Calcium: 7.7 mg/dL — ABNORMAL LOW (ref 8.4–10.5)
Calcium: 7.8 mg/dL — ABNORMAL LOW (ref 8.4–10.5)
Calcium: 7.9 mg/dL — ABNORMAL LOW (ref 8.4–10.5)
Chloride: 113 mEq/L — ABNORMAL HIGH (ref 96–112)
Chloride: 114 mEq/L — ABNORMAL HIGH (ref 96–112)
Chloride: 110 mEq/L (ref 96–112)
Chloride: 113 mEq/L — ABNORMAL HIGH (ref 96–112)
Chloride: 112 mEq/L (ref 96–112)
Chloride: 112 mEq/L (ref 96–112)
Creatinine, Ser: 0.85 mg/dL (ref 0.50–1.10)
Creatinine, Ser: 0.89 mg/dL (ref 0.50–1.10)
Creatinine, Ser: 0.86 mg/dL (ref 0.50–1.10)
Creatinine, Ser: 0.95 mg/dL (ref 0.50–1.10)
GFR calc Af Amer: 74 mL/min — ABNORMAL LOW (ref >90)
GFR calc Af Amer: 65 mL/min — ABNORMAL LOW (ref >90)
GFR calc Af Amer: 75 mL/min — ABNORMAL LOW (ref >90)
GFR calc non Af Amer: 65 mL/min — ABNORMAL LOW (ref >90)
GFR calc non Af Amer: 64 mL/min — ABNORMAL LOW (ref >90)
GFR calc non Af Amer: 57 mL/min — ABNORMAL LOW (ref >90)
GFR, EST AFRICAN AMERICAN: 72 mL/min — AB (ref >90)
GFR, EST AFRICAN AMERICAN: 66 mL/min — AB (ref >90)
GFR, EST AFRICAN AMERICAN: 76 mL/min — AB (ref >90)
GFR, EST AFRICAN AMERICAN: 78 mL/min — AB (ref >90)
GFR, EST NON AFRICAN AMERICAN: 56 mL/min — AB (ref >90)
GFR, EST NON AFRICAN AMERICAN: 62 mL/min — AB (ref >90)
GFR, EST NON AFRICAN AMERICAN: 64 mL/min — AB (ref >90)
GFR, EST NON AFRICAN AMERICAN: 67 mL/min — AB (ref >90)
GLUCOSE: 122 mg/dL — AB (ref 70–99)
GLUCOSE: 216 mg/dL — AB (ref 70–99)
Glucose, Bld: 152 mg/dL — ABNORMAL HIGH (ref 70–99)
Glucose, Bld: 117 mg/dL — ABNORMAL HIGH (ref 70–99)
Glucose, Bld: 102 mg/dL — ABNORMAL HIGH (ref 70–99)
Glucose, Bld: 91 mg/dL (ref 70–99)
Glucose, Bld: 70 mg/dL (ref 70–99)
POTASSIUM: 4.6 mmol/L (ref 3.5–5.1)
POTASSIUM: 4.3 mmol/L (ref 3.5–5.1)
Potassium: 3.3 mmol/L — ABNORMAL LOW (ref 3.5–5.1)
Potassium: 3.2 mmol/L — ABNORMAL LOW (ref 3.5–5.1)
Potassium: 4.3 mmol/L (ref 3.5–5.1)
Potassium: 4.3 mmol/L (ref 3.5–5.1)
Potassium: 3.9 mmol/L (ref 3.5–5.1)
SODIUM: 137 mmol/L (ref 135–145)
Sodium: 138 mmol/L (ref 135–145)
Sodium: 138 mmol/L (ref 135–145)
Sodium: 135 mmol/L (ref 135–145)
Sodium: 137 mmol/L (ref 135–145)
Sodium: 138 mmol/L (ref 135–145)
Sodium: 139 mmol/L (ref 135–145)

## 2014-09-13 LAB — TROPONIN I
TROPONIN I: 2.49 ng/mL — AB (ref <0.031)
Troponin I: 2.25 ng/mL (ref <0.031)
Troponin I: 2.42 ng/mL (ref <0.031)
Troponin I: 2.13 ng/mL (ref <0.031)

## 2014-09-13 LAB — GLUCOSE, CAPILLARY
GLUCOSE-CAPILLARY: 165 mg/dL — AB (ref 70–99)
GLUCOSE-CAPILLARY: 139 mg/dL — AB (ref 70–99)
GLUCOSE-CAPILLARY: 130 mg/dL — AB (ref 70–99)
GLUCOSE-CAPILLARY: 136 mg/dL — AB (ref 70–99)
GLUCOSE-CAPILLARY: 99 mg/dL (ref 70–99)
Glucose-Capillary: 120 mg/dL — ABNORMAL HIGH (ref 70–99)
Glucose-Capillary: 140 mg/dL — ABNORMAL HIGH (ref 70–99)
Glucose-Capillary: 194 mg/dL — ABNORMAL HIGH (ref 70–99)
Glucose-Capillary: 250 mg/dL — ABNORMAL HIGH (ref 70–99)
Glucose-Capillary: 115 mg/dL — ABNORMAL HIGH (ref 70–99)
Glucose-Capillary: 117 mg/dL — ABNORMAL HIGH (ref 70–99)
Glucose-Capillary: 98 mg/dL (ref 70–99)
Glucose-Capillary: 93 mg/dL (ref 70–99)
Glucose-Capillary: 86 mg/dL (ref 70–99)
Glucose-Capillary: 80 mg/dL (ref 70–99)

## 2014-09-13 LAB — CBC WITH DIFFERENTIAL/PLATELET
BASOS ABS: 0 10*3/uL (ref 0.0–0.1)
Basophils Relative: 0 % (ref 0–1)
EOS PCT: 0 % (ref 0–5)
Eosinophils Absolute: 0 10*3/uL (ref 0.0–0.7)
HCT: 26.8 % — ABNORMAL LOW (ref 36.0–46.0)
HEMOGLOBIN: 8.9 g/dL — AB (ref 12.0–15.0)
LYMPHS ABS: 1.2 10*3/uL (ref 0.7–4.0)
Lymphocytes Relative: 5 % — ABNORMAL LOW (ref 12–46)
MCH: 29.3 pg (ref 26.0–34.0)
MCHC: 33.2 g/dL (ref 30.0–36.0)
MCV: 88.2 fL (ref 78.0–100.0)
Monocytes Absolute: 1.7 10*3/uL — ABNORMAL HIGH (ref 0.1–1.0)
Monocytes Relative: 7 % (ref 3–12)
NEUTROS PCT: 88 % — AB (ref 43–77)
Neutro Abs: 21.5 10*3/uL — ABNORMAL HIGH (ref 1.7–7.7)
Platelets: 347 10*3/uL (ref 150–400)
RBC: 3.04 MIL/uL — AB (ref 3.87–5.11)
RDW: 15 % (ref 11.5–15.5)
WBC: 24.4 10*3/uL — ABNORMAL HIGH (ref 4.0–10.5)

## 2014-09-13 LAB — HEPARIN LEVEL (UNFRACTIONATED)
HEPARIN UNFRACTIONATED: 0.92 [IU]/mL — AB (ref 0.30–0.70)
HEPARIN UNFRACTIONATED: 0.94 [IU]/mL — AB (ref 0.30–0.70)
Heparin Unfractionated: 0.56 IU/mL (ref 0.30–0.70)

## 2014-09-13 LAB — CBC
HCT: 26.4 % — ABNORMAL LOW (ref 36.0–46.0)
Hemoglobin: 9 g/dL — ABNORMAL LOW (ref 12.0–15.0)
MCH: 28.8 pg (ref 26.0–34.0)
MCHC: 34.1 g/dL (ref 30.0–36.0)
MCV: 84.6 fL (ref 78.0–100.0)
Platelets: 321 10*3/uL (ref 150–400)
RBC: 3.12 MIL/uL — ABNORMAL LOW (ref 3.87–5.11)
RDW: 14.5 % (ref 11.5–15.5)
WBC: 18.8 10*3/uL — ABNORMAL HIGH (ref 4.0–10.5)

## 2014-09-13 LAB — APTT
APTT: 67 s — AB (ref 24–37)
aPTT: 135 seconds — ABNORMAL HIGH (ref 24–37)
aPTT: 193 seconds — ABNORMAL HIGH (ref 24–37)

## 2014-09-13 LAB — PROTIME-INR
INR: 1.38 (ref 0.00–1.49)
Prothrombin Time: 17.1 seconds — ABNORMAL HIGH (ref 11.6–15.2)

## 2014-09-13 MED ORDER — DEXTROSE 50 % IV SOLN
1.0000 | Freq: Once | INTRAVENOUS | Status: AC
  Administered 2014-09-13: 25 mL via INTRAVENOUS
  Filled 2014-09-13: qty 50

## 2014-09-13 MED ORDER — INSULIN GLARGINE 100 UNIT/ML ~~LOC~~ SOLN
10.0000 [IU] | Freq: Every day | SUBCUTANEOUS | Status: DC
  Administered 2014-09-13: 10 [IU] via SUBCUTANEOUS
  Filled 2014-09-13 (×2): qty 0.1

## 2014-09-13 MED ORDER — NOREPINEPHRINE BITARTRATE 1 MG/ML IV SOLN
0.0000 ug/min | INTRAVENOUS | Status: DC
  Administered 2014-09-13: 6 ug/min via INTRAVENOUS
  Administered 2014-09-14: 22 ug/min via INTRAVENOUS
  Administered 2014-09-15: 10 ug/min via INTRAVENOUS
  Filled 2014-09-13 (×3): qty 16

## 2014-09-13 MED ORDER — INSULIN ASPART 100 UNIT/ML ~~LOC~~ SOLN
2.0000 [IU] | SUBCUTANEOUS | Status: DC
  Administered 2014-09-13: 2 [IU] via SUBCUTANEOUS
  Administered 2014-09-15: 4 [IU] via SUBCUTANEOUS
  Administered 2014-09-15: 2 [IU] via SUBCUTANEOUS
  Administered 2014-09-15: 4 [IU] via SUBCUTANEOUS
  Administered 2014-09-15: 2 [IU] via SUBCUTANEOUS
  Administered 2014-09-15 (×2): 4 [IU] via SUBCUTANEOUS
  Administered 2014-09-16: 2 [IU] via SUBCUTANEOUS
  Administered 2014-09-16 (×2): 4 [IU] via SUBCUTANEOUS

## 2014-09-13 MED ORDER — HEPARIN (PORCINE) IN NACL 100-0.45 UNIT/ML-% IJ SOLN
350.0000 [IU]/h | INTRAMUSCULAR | Status: DC
  Administered 2014-09-13: 350 [IU]/h via INTRAVENOUS
  Filled 2014-09-13: qty 250

## 2014-09-13 MED ORDER — IPRATROPIUM-ALBUTEROL 0.5-2.5 (3) MG/3ML IN SOLN: 3.0000 mL | RESPIRATORY_TRACT | Status: DC

## 2014-09-13 MED ORDER — POTASSIUM CHLORIDE 10 MEQ/50ML IV SOLN
10.0000 meq | INTRAVENOUS | Status: AC
  Administered 2014-09-13 (×6): 10 meq via INTRAVENOUS
  Filled 2014-09-13 (×6): qty 50

## 2014-09-13 NOTE — Progress Notes (Signed)
ANTICOAGULATION CONSULT NOTE  Pharmacy Consult for Heparin Indication:  H/o Afib  Allergies  Allergen Reactions  . Atorvastatin Other (See Comments)    Muscle aches  . Hydrochlorothiazide W-Triamterene     REACTION: palpitations  . Penicillins     REACTION: urticaria (hives)  . Statins     Subjective myalgia   Patient Measurements: Height: 5\' 2"  (157.5 cm) Weight: 130 lb 8.2 oz (59.2 kg) (Act[facility] w[facility]. [facility].) IBW/kg (Calculated) : 50.1 Heparin Dosing Weight: 54  Vital Signs: Temp: 91.4 F (33 C) (01/10 0400) Temp Source: Core (Comment) (01/10 0400) BP: 67/46 mmHg (01/10 0229) Pulse Rate: 61 (01/10 0032)  Labs:  Recent Labs  09/11/14 0521 09/26/2014 0130 09/15/2014 1543  10/04/2014 1930 09/29/2014 1943 09/04/2014 2055 10/01/2014 2157 09/08/2014 2355 09/13/14 0200 09/13/14 0315  HGB 8.0*  --  8.9*  < > 8.9* 9.5* 9.9* 10.5*  --   --   --   HCT 23.9*  --  26.7*  < > 26.8* 28.0* 29.0* 31.0*  --   --   --   PLT 216  --  294  --  347  --   --   --   --   --   --   APTT  --   --   --   --  66*  --   --   --   --   --  135*  LABPROT 16.4*  --  17.2*  --  18.2*  --   --   --   --   --  17.1*  INR 1.30  --  1.39  --  1.50*  --   --   --   --   --  1.38  CREATININE 0.79  --  0.98  < > 0.86 0.70 0.80 0.80 0.83 0.85  --   TROPONINI  --  2.25*  --   --  3.62*  --   --   --  2.49*  --   --   < > = values in this interval not displayed. Estimated Creatinine Clearance: 45.2 mL/min (by C-G formula based on Cr of 0.85).   Assessment: 74 yo female admitted with cardiac arrest s/p cath, h/o Afib Eliquis on hold, now on hypothermia protocol, for heparin  Goal of Therapy:  APTT 66-102 Heparin level 0.3-0.7 units/ml Monitor platelets by anticoagulation protocol: Yes     Plan:  Decrease Heparin 600 units/hr Check heparin level in 8 hours.    Geannie RisenGreg Peytan Andringa, PharmD, BCPS  09/13/2014,4:27 AM

## 2014-09-13 NOTE — Progress Notes (Signed)
Made on call cardiologist aware of TR band still in place and heparin gtt ordered to be started at 2200. Also made aware of elevatedTroponin 3.6. MD advised to go ahead and start gtt at scheduled time. Will continue to monitor.

## 2014-09-13 NOTE — Progress Notes (Signed)
Hypoglycemic Event  CBG: 61  Treatment: D50 IV 25 mL  Symptoms: None  Follow-up CBG: Time:0009 CBG Result:121  Possible Reasons for Event: Inadequate meal intake  Comments/MD notified:protocol followed    Guffey, [name]  Remember to initiate Hypoglycemia Order Set & complete

## 2014-09-13 NOTE — Progress Notes (Signed)
EKG CRITICAL VALUE     12 lead EKG performed.  Critical value noted.  Shirlean Kellyose Collum, RN notified.   Almedia BallsBauer, Vernard Gram N, [place] 09/13/2014 7:24 AM

## 2014-09-13 NOTE — Progress Notes (Signed)
ANTICOAGULATION CONSULT NOTE  Pharmacy Consult for Heparin Indication:  H/o Afib  Allergies  Allergen Reactions  . Atorvastatin Other (See Comments)    Muscle aches  . Hydrochlorothiazide W-Triamterene     REACTION: palpitations  . Penicillins     REACTION: urticaria (hives)  . Statins     Subjective myalgia   Patient Measurements: Height:  (157.5 cm) Weight: 123 lb 7.3 oz (56 kg) (minus water filled pads) IBW/kg (Calculated) : 50.1 Heparin Dosing Weight: 54  Vital Signs: Temp: 91.4 F (33 C) (01/10 1400) Temp Source: Core (Comment) (01/10 1400) BP: 67/46 mmHg (01/10 0229) Pulse Rate: 28 (01/10 1339)  Labs:  Recent Labs  09/20/2014 1543  09/05/2014 1930  09/08/2014 2055 09/25/2014 2157 09/18/2014 2355 09/13/14 0200 09/13/14 0315 09/13/14 0530 09/13/14 0940 09/13/14 1155 09/13/14 1200  HGB 8.9*  < > 8.9*  < > 9.9* 10.5*  --   --   --  9.0*  --   --   --   HCT 26.7*  < > 26.8*  < > 29.0* 31.0*  --   --   --  26.4*  --   --   --   PLT 294  --  347  --   --   --   --   --   --  321  --   --   --   APTT  --   --  66*  --   --   --   --   --  135*  --   --  193*  --   LABPROT 17.2*  --  18.2*  --   --   --   --   --  17.1*  --   --   --   --   INR 1.39  --  1.50*  --   --   --   --   --  1.38  --   --   --   --   HEPARINUNFRC  --   --   --   --   --   --   --   --   --  0.92*  --   --  0.94*  CREATININE 0.98  < > 0.86  < > 0.80 0.80 0.83 0.85  --  0.87 0.89  --   --   TROPONINI  --   --  3.62*  --   --   --  2.49*  --   --  2.42*  --  2.13*  --   < > = values in this interval not displayed. Estimated Creatinine Clearance: 43.2 mL/min (by C-G formula based on Cr of 0.89).   Assessment: 74 yo female admitted with cardiac arrest s/p cath, h/o Afib Eliquis on hold, now on hypothermia protocol (to re-warm ~1945 per RN). Continuing on heparin IV for STEMI/afib history. To follow aPTTs until HL correlates. Levels very elevated. APTT up to 193/HL 0.94  units/hr. To hold  heparin x 1 hour, then restart at decreased rate and check level. Hg stable 9, plt stable wnl. Spoke with MD- pt had some bleeding post-op but ok to continue heparin, no obvious bleeding today per RN  Goal of Therapy:  APTT 66-102 Heparin level 0.3-0.7 units/ml Monitor platelets by anticoagulation protocol: Yes     Plan:  -Hold heparin x1 hour -Decrease heparin rate to 350 units/hr -6-hr HL -Monitor increased s/sx bleeding -Rewarm   Babs Bertin, PharmD Clinical Pharmacist - Resident Pager  [phone] 09/13/2014 2:09 PM

## 2014-09-13 NOTE — Progress Notes (Signed)
PULMONARY / CRITICAL CARE MEDICINE   Name: Erin PlummerDollie M Bilodeau MRN: [id number] DOB: 1941-04-02    ADMISSION DATE:  09/08/2014 CONSULTATION DATE:  09/04/2014   REFERRING MD :  Dr. Excell Seltzerooper   CHIEF COMPLAINT:  Cardiac arrest   INITIAL PRESENTATION:   74 year old female w/ sig h/o ETOH, CAF and recent MI. Discharged 1/8 s/p PCI w/ DES to RCA and triple drug therapy. Returned to ER s/p VF arrest on 1/9. Down time estimated 20 minutes to ROSC. STAT to cath lab: no sig change from recent cath. PCCM asked to see for hypothermia.   STUDIES:  09/07/14 echo EF 55-60% mod HK and basalinferior myocardium. High ventricular filling pressure, mild MR, PAP 33 Left heart cath 1/9: 1. Severe stenosis of the LAD and left circumflex without significant change from the previous study 2. Widely patent RCA with no significant stenosis of the recently placed stent 3. Mild LV systolic dysfunction with inferior wall akinesis and an estimated LVEF of 45-50%.  SIGNIFICANT EVENTS: 1/9 admit, hypothermia protocol   1/9 CXR Multiple lucencies projecting over the left hemithorax are nonspecific on portable chest radiograph. While these may be secondary to skin folds, pneumothorax is not excluded. Recommendattention on repeat radiograph. Diffuse bilateral heterogeneous pulmonary opacities, favored to represent edema.  1/9 CXR Interval placement right central venous line without complication. Diffuse airspace disease suggesting pulmonary edema, not changed from prior.  1/9 CT head Currently no evidence of acute abnormality although findings of anoxic injury may require up to 12 hr to show CT manifestation. Consider MRI or follow-up CT scan in approximately 12 hr to re-evaluate.   REVIEW OF SYSTEMS:   Unable to obtain due to mental status  SUBJECTIVE:   RN: pt was on Levo 0 this am BP sbp in the 60s. Levo has been up to 20 and sbp 180s. Levo now at 5 and sbp 150s. On Hep gtt but stopped due to high level at noon.  In NSR (HR  range 50s-80s) with 1st degree block and ectopy. At times SB in the 50s.  Decreased urine output 10 cc/hr. 1/9 given Lasix 80 x 1. Shivering today.  Will finish hypothermia protocol at 7:45 pm.  Agitated this am, Versed 3, Fentanyl increased 50 to 250.  Nimbex changed from 1 to 1.5.     VITAL SIGNS: Temp:  [90.9 F (32.7 C)-97.2 F (36.2 C)] 91.4 F (33 C) (01/10 1400) Pulse Rate:  [28-148] 28 (01/10 1339) Resp:  [19-26] 20 (01/10 1400) BP: (65-222)/(37-169) 67/46 mmHg (01/10 0229) SpO2:  [93 %-100 %] 100 % (01/10 1400) Arterial Line BP: (64-183)/(45-104) 127/91 mmHg (01/10 1400) FiO2 (%):  [40 %-100 %] 40 % (01/10 1400) Weight:  [119 lb (53.978 kg)-130 lb 8.2 oz (59.2 kg)] 123 lb 7.3 oz (56 kg) (01/10 0500) HEMODYNAMICS: CVP:  [3 mmHg-7 mmHg] 6 mmHg VENTILATOR SETTINGS: Vent Mode:  [-] PRVC FiO2 (%):  [40 %-100 %] 40 % Set Rate:  [18 bmp-20 bmp] 20 bmp Vt Set:  [490 mL] 490 mL PEEP:  [5 cmH20] 5 cmH20 Plateau Pressure:  [24 cmH20-30 cmH20] 24 cmH20 INTAKE / OUTPUT:  Intake/Output Summary (Last 24 hours) at 09/13/14 1423 Last data filed at 09/13/14 1400  Gross per 24 hour  Intake 1455.05 ml  Output   2230 ml  Net -774.95 ml    PHYSICAL EXAMINATION: General: Unresponsive s/p arrest. No w/d to pain or noxious stim  Neuro: GCS 3. Unresponsive  HEENT: Orally intubated. + JVD Cardiovascular: Tachy irreg  irreg Weak posterior tib pulse audible by doppler  Lungs: Diffuse rhonchi w/ tactile fremitus  Abdomen: Soft, non-tender + bowel sounds that are hypoactive  Musculoskeletal: Intact  Skin: Dry, cool and mottled  LABS:  CBC  Recent Labs Lab 09/28/2014 1543  10/01/2014 1930  09/22/2014 2055 09/11/2014 2157 09/13/14 0530  WBC 16.8*  --  24.4*  --   --   --  18.8*  HGB 8.9*  < > 8.9*  < > 9.9* 10.5* 9.0*  HCT 26.7*  < > 26.8*  < > 29.0* 31.0* 26.4*  PLT 294  --  347  --   --   --  321  < > = values in this interval not displayed. Coag's  Recent Labs Lab  09/06/2014 1543 09/27/2014 1930 09/13/14 0315 09/13/14 1155  APTT  --  66* 135* 193*  INR 1.39 1.50* 1.38  --    BMET  Recent Labs Lab 09/13/14 0530 09/13/14 0940 09/13/14 1335  NA 137 135 137  K 4.6 4.3 4.3  CL 114* 110 113*  CO2 18* 17* 16*  BUN 15 16 16   CREATININE 0.87 0.89 0.97  GLUCOSE 122* 117* 102*   Electrolytes  Recent Labs Lab 09/30/2014 1543 09/09/2014 1930  09/13/14 0530 09/13/14 0940 09/13/14 1335  CALCIUM 8.3* 6.6*  < > 7.7* 7.7* 7.8*  MG 2.2 1.6  --   --   --   --   PHOS 7.0*  --   --   --   --   --   < > = values in this interval not displayed. Sepsis Markers  Recent Labs Lab 09/30/2014 1619  LATICACIDVEN 9.53*   ABG  Recent Labs Lab 09/13/2014 1555 09/24/2014 2320  PHART 7.134* 7.370  PCO2ART 35.5 29.6*  PO2ART 51.0* 165.0*   Liver Enzymes  Recent Labs Lab 09/06/2014 1543 10/04/2014 1930  AST 110* 179*  ALT 83* 143*  ALKPHOS 89 104  BILITOT 0.5 0.8  ALBUMIN 2.3* 2.0*   Cardiac Enzymes  Recent Labs Lab 09/09/2014 2355 09/13/14 0530 09/13/14 1155  TROPONINI 2.49* 2.42* 2.13*   Glucose  Recent Labs Lab 09/13/14 0520 09/13/14 0625 09/13/14 0730 09/13/14 0924 09/13/14 1137 09/13/14 1332  GLUCAP 120* 115* 136* 117* 99 98    Imaging Dg Chest Port 1 View  09/20/2014   CLINICAL DATA:  Central line evaluation.  Intubated patient.  EXAM: PORTABLE CHEST - 1 VIEW  COMPARISON:  Radiograph 09/30/2014  FINDINGS: Endotracheal tube and NG tube are unchanged. Interval placement of a right central venous line with tip in the distal SVC. No pneumothorax.  Stable cardiac silhouette. There is diffuse bilateral airspace disease more dense on the right. No pleural fluid.  IMPRESSION: 1. Interval placement right central venous line without complication. 2. Diffuse airspace disease suggesting pulmonary edema, not changed from prior. 3. Other support apparatus stable.   Electronically Signed   By: Genevive Bi M.D.   On: 09/26/2014 19:31   Dg Chest  Portable 1 View  09/24/2014   CLINICAL DATA:  Patient status post CPR.  History of COPD.  EXAM: PORTABLE CHEST - 1 VIEW  COMPARISON:  09/08/2014  FINDINGS: ET tube terminates in the mid trachea. Enteric tube courses inferior to the diaphragm. Patient is rotated. Tortuosity of the thoracic aorta. Stable cardiac contours. Diffuse bilateral heterogeneous pulmonary opacities. Multiple irregular lucencies projecting over the left upper and lower hemi thorax.  IMPRESSION: Multiple lucencies projecting over the left hemithorax are nonspecific on portable chest radiograph. While  these may be secondary to skin folds, pneumothorax is not excluded. Recommend attention on repeat radiograph.  Diffuse bilateral heterogeneous pulmonary opacities, favored to represent edema.  These results will be called to the ordering clinician or representative by the Radiologist Assistant, and communication documented in the PACS or zVision Dashboard.   Electronically Signed   By: Annia Belt M.D.   On: 09/18/2014 16:53   Ct Portable Head W/o Cm  09/16/2014   CLINICAL DATA:  Initial evaluation for cardiac arrest and CPR for 20 min, concern for acute hypoxic encephalopathy  EXAM: CT HEAD WITHOUT CONTRAST  TECHNIQUE: Contiguous axial images were obtained from the base of the skull through the vertex without intravenous contrast.  COMPARISON:  None.  FINDINGS: Endotracheal tube and orogastric tube noted.  Moderate diffuse atrophy. No abnormal attenuation to suggest acute vascular territory infarct. No hemorrhage or extra-axial fluid. No hydrocephalus.  IMPRESSION: Currently no evidence of acute abnormality although findings of anoxic injury may require up to 12 hr to show CT manifestation. Consider MRI or follow-up CT scan in approximately 12 hr to re-evaluate.   Electronically Signed   By: Esperanza Heir M.D.   On: 09/30/2014 21:27     ASSESSMENT / PLAN:  PULMONARY OETT 1/9>> A:  VDRF s/p cardiopulmonary arrest  Diffuse b/l  infiltrates ? pna vs aspiration Pulmonary edema  H/o COPD  P:   Pending repeat CXR, ABG Continue full vent support  Prn BD's   CARDIOVASCULAR CVL right IJ 1/9, left radial A line A:  VF/cardiac arrest  CAD s/p recent stent to RCA, with sig disease LAD H/o paroxysmal AF  Elevated trop down trending 3.62>>2.13    P:  Heparin gtt  Cards following  Amio gtt, Aspirin 81, Plavix 75, Pravachol 80, Lopressor 12.5 mg bid Pending repeat echo post cath Levo    RENAL A:   Oliguria  Non anion gap metabolic acidosis   P:   Strict i/o, daily weights Defer further lasix 1/10 Trend CBC  GASTROINTESTINAL A:   GI px  NPO with NG/OG P:   Protonix 40 mg qd   HEMATOLOGIC A:   DVT px Normocytic anemia hbg 9.0 (down from BL 12-14) Elevated PTT  P:  Hep gtt held some today due to elevated levels Will check fobt   INFECTIOUS A:   Leukocytosis down trending  Suspected pna vs aspiration  P:   BCx2 not obtained  UC pending Sputum pending and ordered  MRSA neg Abx: Levaquin q48 1/9   Monitor CBC  ENDOCRINE A:   Hyperglycemia resolved with h/o DM H/o hypothyroidism  P:   Monitor cbgs q4  Lantus 10 units qd, SSI (2-6 units) q4  Synthyroid 50 mcg qd   NEUROLOGIC A:   Sedated  Encephalopathy   P:   RASS goal: -5 currently -5  Nimbex, Fentany gtt and 25 q30 min prnl, Versed Pending EEG    FAMILY  - Updates: will update family if not at beside - Inter-disciplinary family meet or Palliative Care meeting due by:  09/21/14     TODAY'S SUMMARY:  CAD s/p VT/VF arrest. Hypothermia initiated. Pressors being weaned. Will hold diuretics 1/10, follow MS on rewarming.     09/13/2014, 2:23 PM Desma Maxim MD [phone]    Attending Note:  I have examined patient, reviewed labs, studies and notes. I have discussed the case with Dr Shirlee Latch, and I agree with the data and plans as amended above. She is sedated and paralyzed to facilitate hypothermia.  Rewarming to occur  this PM. CXR is improved. Continues to have labile BP. Will hold lasix, wean pressors as able. Follow MS.  Independent critical care time is 45 minutes.   Levy Pupa, MD, PhD 09/13/2014, 3:12 PM [facility] [phone] or if no answer [phone]

## 2014-09-13 NOTE — Progress Notes (Signed)
Sputum obtained(thick, tan, pink-tinged) and sent to lab by RT.

## 2014-09-13 NOTE — Progress Notes (Signed)
ANTICOAGULATION CONSULT NOTE  Pharmacy Consult for Heparin Indication:  H/o Afib  Allergies  Allergen Reactions  . Atorvastatin Other (See Comments)    Muscle aches  . Hydrochlorothiazide W-Triamterene Other (See Comments)     palpitations  . Penicillins Hives  . Statins Other (See Comments)    Subjective myalgia   Patient Measurements: Height:  (157.5 cm) Weight: 123 lb 7.3 oz (56 kg) (minus water filled pads) IBW/kg (Calculated) : 50.1 Heparin Dosing Weight: 54  Vital Signs: Temp: 91.6 F (33.1 C) (01/10 2100) Temp Source: Core (Comment) (01/10 2100) BP: 135/95 mmHg (01/10 1515) Pulse Rate: 68 (01/10 1900)  Labs:  Recent Labs  2014-09-23 1543  2014/09/23 1930  09-23-14 2055 09-23-14 2157 Sep 23, 2014 2355  09/13/14 0315 09/13/14 0530 09/13/14 0940 09/13/14 1155 09/13/14 1200 09/13/14 1335 09/13/14 1750 09/13/14 1935 09/13/14 1955  HGB 8.9*  < > 8.9*  < > 9.9* 10.5*  --   --   --  9.0*  --   --   --   --   --   --   --   HCT 26.7*  < > 26.8*  < > 29.0* 31.0*  --   --   --  26.4*  --   --   --   --   --   --   --   PLT 294  --  347  --   --   --   --   --   --  321  --   --   --   --   --   --   --   APTT  --   < > 66*  --   --   --   --   --  135*  --   --  193*  --   --   --  67*  --   LABPROT 17.2*  --  18.2*  --   --   --   --   --  17.1*  --   --   --   --   --   --   --   --   INR 1.39  --  1.50*  --   --   --   --   --  1.38  --   --   --   --   --   --   --   --   HEPARINUNFRC  --   --   --   --   --   --   --   --   --  0.92*  --   --  0.94*  --   --   --  0.56  CREATININE 0.98  < > 0.86  < > 0.80 0.80 0.83  < >  --  0.87 0.89  --   --  0.97 0.86  --   --   TROPONINI  --   --  3.62*  --   --   --  2.49*  --   --  2.42*  --  2.13*  --   --   --   --   --   < > = values in this interval not displayed. Estimated Creatinine Clearance: 44.7 mL/min (by C-G formula based on Cr of 0.86).  Assessment: 74 yo female admitted with cardiac arrest s/p cath, h/o Afib  Eliquis on hold, now on hypothermia protocol (to re-warm ~1945 per RN). Continuing on heparin IV for STEMI/afib  history. aPTTs now correlating (67 sec with HL of 0.56).  Will discontinue checking APTT and just use heparin levels for now.  No noted bleeding.  Goal of Therapy:  Heparin level 0.3-0.7 units/ml Monitor platelets by anticoagulation protocol: Yes     Plan:  -Continue heparin rate to 350 units/hr -Monitor increased s/sx bleeding -Rewarm @1945  - Recheck heparin level with AM labs  Nadara MustardNita Nilaya Bouie, PharmD., MS Clinical Pharmacist Pager:  [phone] Thank you for allowing pharmacy to be part of this patients care team. 09/13/2014 9:32 PM

## 2014-09-13 NOTE — Progress Notes (Signed)
eLink Physician-Brief Progress Note Patient Name: Erin PlummerDollie M Spease DOB: 08-Apr-1941 MRN: [id number]   Date of Service  09/13/2014  HPI/Events of Note  Hypokalemia  eICU Interventions  Potassium replaced     Intervention Category Intermediate Interventions: Electrolyte abnormality - evaluation and management  Danissa Rundle 09/13/2014, 1:54 AM

## 2014-09-13 NOTE — Progress Notes (Signed)
Patient Name: Erin PlummerDollie M Hevia Date of Encounter: 09/13/2014  Active Problems:   Ventricular fibrillation   Cardiac arrest   Length of Stay: 1  SUBJECTIVE  Was opening her eyes earlier, so paralytics and sedatives were increased. Subsequently, dose of norepi was also increased. Still in midst of hypothermia protocol. Except for above, hemodynamically stable. Radial access site is healthy  CURRENT MEDS . sodium chloride  2,000 mL Intravenous Once  . antiseptic oral rinse  7 mL Mouth Rinse QID  . artificial tears  1 application Both Eyes 3 times per day  . aspirin  81 mg Per Tube Daily  . chlorhexidine  15 mL Mouth Rinse BID  . cisatracurium  0.1 mg/kg Intravenous Once  . clopidogrel  75 mg Per Tube Daily  . fentaNYL  50 mcg Intravenous Once  . insulin aspart  2-6 Units Subcutaneous 6 times per day  . insulin glargine  10 Units Subcutaneous Daily  . levofloxacin (LEVAQUIN) IV  750 mg Intravenous Q48H  . levothyroxine  50 mcg Per Tube QAC breakfast  . metoprolol tartrate  12.5 mg Per Tube BID  . midazolam  1 mg Intravenous Once  . pantoprazole (PROTONIX) IV  40 mg Intravenous QHS  . pravastatin  80 mg Per Tube q1800  . sodium chloride  3 mL Intravenous Q12H    OBJECTIVE   Intake/Output Summary (Last 24 hours) at 09/13/14 1231 Last data filed at 09/13/14 1200  Gross per 24 hour  Intake 1346.11 ml  Output   2185 ml  Net -838.89 ml   Filed Weights   04/30/15 1545 04/30/15 1750 09/13/14 0500  Weight: 119 lb (53.978 kg) 130 lb 8.2 oz (59.2 kg) 123 lb 7.3 oz (56 kg)    PHYSICAL EXAM Filed Vitals:   09/13/14 1122 09/13/14 1127 09/13/14 1134 09/13/14 1200  BP:      Pulse:  129    Temp:    91.6 F (33.1 C)  TempSrc:    Core (Comment)  Resp: 20 20 20 20   Height:      Weight:      SpO2:  100%  100%   General: sedated and paralyzed; 33 deg C Head: no evidence of trauma, PERRL, EOMI, no exophtalmos or lid lag, no myxedema, no xanthelasma; normal ears, nose and  oropharynx Neck: normal jugular venous pulsations and no hepatojugular reflux; brisk carotid pulses without delay and no carotid bruits Chest: clear to auscultation, no signs of consolidation by percussion or palpation, normal fremitus, symmetrical and full respiratory excursions Cardiovascular: normal position and quality of the apical impulse, regular rhythm, normal first and second heart sounds, no rubs or gallops, no murmur Abdomen: no tenderness or distention, no masses by palpation, no abnormal pulsatility or arterial bruits, normal bowel sounds, no hepatosplenomegaly Extremities: no clubbing, cyanosis or edema; 2+ radial, ulnar and brachial pulses bilaterally; 2+ right femoral, posterior tibial and dorsalis pedis pulses; 2+ left femoral, posterior tibial and dorsalis pedis pulses; no subclavian or femoral bruits Neurological: cannot assess  LABS  CBC  Recent Labs  04/30/15 1543  04/30/15 1930  04/30/15 2157 09/13/14 0530  WBC 16.8*  --  24.4*  --   --  18.8*  NEUTROABS 11.9*  --  21.5*  --   --   --   HGB 8.9*  < > 8.9*  < > 10.5* 9.0*  HCT 26.7*  < > 26.8*  < > 31.0* 26.4*  MCV 91.1  --  88.2  --   --  84.6  PLT 294  --  347  --   --  321  < > = values in this interval not displayed. Basic Metabolic Panel  Recent Labs  09/14/2014 1543  09/28/2014 1930  09/13/14 0530 09/13/14 0940  NA 138  < > 136  < > 137 135  K 5.1  < > 4.0  < > 4.6 4.3  CL 111  < > 113*  < > 114* 110  CO2 12*  --  17*  < > 18* 17*  GLUCOSE 311*  < > 301*  < > 122* 117*  BUN 9  < > 12  < > 15 16  CREATININE 0.98  < > 0.86  < > 0.87 0.89  CALCIUM 8.3*  --  6.6*  < > 7.7* 7.7*  MG 2.2  --  1.6  --   --   --   PHOS 7.0*  --   --   --   --   --   < > = values in this interval not displayed. Liver Function Tests  Recent Labs  09/06/2014 1543 10/04/2014 1930  AST 110* 179*  ALT 83* 143*  ALKPHOS 89 104  BILITOT 0.5 0.8  PROT 4.2* 4.1*  ALBUMIN 2.3* 2.0*   No results for input(s): LIPASE, AMYLASE in  the last 72 hours. Cardiac Enzymes  Recent Labs  09/13/2014 1930 09/29/2014 2355 09/13/14 0530  TROPONINI 3.62* 2.49* 2.42*  Thyroid Function Tests  Recent Labs  09/29/2014 1930  TSH 4.093    Radiology Studies Imaging results have been reviewed and Dg Chest Port 1 View  09/06/2014   CLINICAL DATA:  Central line evaluation.  Intubated patient.  EXAM: PORTABLE CHEST - 1 VIEW  COMPARISON:  Radiograph 09/08/2014  FINDINGS: Endotracheal tube and NG tube are unchanged. Interval placement of a right central venous line with tip in the distal SVC. No pneumothorax.  Stable cardiac silhouette. There is diffuse bilateral airspace disease more dense on the right. No pleural fluid.  IMPRESSION: 1. Interval placement right central venous line without complication. 2. Diffuse airspace disease suggesting pulmonary edema, not changed from prior. 3. Other support apparatus stable.   Electronically Signed   By: Genevive Bi M.D.   On: 10/04/2014 19:31   Dg Chest Portable 1 View  09/09/2014   CLINICAL DATA:  Patient status post CPR.  History of COPD.  EXAM: PORTABLE CHEST - 1 VIEW  COMPARISON:  09/08/2014  FINDINGS: ET tube terminates in the mid trachea. Enteric tube courses inferior to the diaphragm. Patient is rotated. Tortuosity of the thoracic aorta. Stable cardiac contours. Diffuse bilateral heterogeneous pulmonary opacities. Multiple irregular lucencies projecting over the left upper and lower hemi thorax.  IMPRESSION: Multiple lucencies projecting over the left hemithorax are nonspecific on portable chest radiograph. While these may be secondary to skin folds, pneumothorax is not excluded. Recommend attention on repeat radiograph.  Diffuse bilateral heterogeneous pulmonary opacities, favored to represent edema.  These results will be called to the ordering clinician or representative by the Radiologist Assistant, and communication documented in the PACS or zVision Dashboard.   Electronically Signed   By: Annia Belt M.D.   On: 09/14/2014 16:53   Ct Portable Head W/o Cm  09/20/2014   CLINICAL DATA:  Initial evaluation for cardiac arrest and CPR for 20 min, concern for acute hypoxic encephalopathy  EXAM: CT HEAD WITHOUT CONTRAST  TECHNIQUE: Contiguous axial images were obtained from the base of the skull through the vertex  without intravenous contrast.  COMPARISON:  None.  FINDINGS: Endotracheal tube and orogastric tube noted.  Moderate diffuse atrophy. No abnormal attenuation to suggest acute vascular territory infarct. No hemorrhage or extra-axial fluid. No hydrocephalus.  IMPRESSION: Currently no evidence of acute abnormality although findings of anoxic injury may require up to 12 hr to show CT manifestation. Consider MRI or follow-up CT scan in approximately 12 hr to re-evaluate.   Electronically Signed   By: Esperanza Heir M.D.   On: 09/20/14 21:27    TELE NSR, PVCs  ECG Evolving inferior MI with some residual ST elevation, prominent anterolateral ST depression and T wave inversion with marked QT prolongation  ASSESSMENT AND PLAN  Hypothermia/pressor dependent after resuscitatedVF arrest. Troponin pattern is more consistent with gradual washout from initial inferior MI, rather than a new ischemic event. ECG changes are hard to interpret following arrest and during hypothermia, but may indicate LAD artery territory ischemia. Primary arrhythmia appears more likely, 7 days after infarction, raising issue of need for secondary prevention ICD.  Thurmon Fair, MD, [facility] [phone] office [phone] pager 09/13/2014 12:31 PM

## 2014-09-14 ENCOUNTER — Telehealth: Payer: Self-pay | Admitting: *Deleted

## 2014-09-14 ENCOUNTER — Inpatient Hospital Stay (HOSPITAL_COMMUNITY): Payer: Medicare Other

## 2014-09-14 DIAGNOSIS — I469 Cardiac arrest, cause unspecified: Secondary | ICD-10-CM

## 2014-09-14 DIAGNOSIS — J9601 Acute respiratory failure with hypoxia: Secondary | ICD-10-CM

## 2014-09-14 DIAGNOSIS — G253 Myoclonus: Secondary | ICD-10-CM | POA: Diagnosis present

## 2014-09-14 DIAGNOSIS — I059 Rheumatic mitral valve disease, unspecified: Secondary | ICD-10-CM

## 2014-09-14 DIAGNOSIS — I4901 Ventricular fibrillation: Principal | ICD-10-CM

## 2014-09-14 DIAGNOSIS — G931 Anoxic brain damage, not elsewhere classified: Secondary | ICD-10-CM | POA: Diagnosis present

## 2014-09-14 LAB — URINE CULTURE: Colony Count: 3000

## 2014-09-14 LAB — BLOOD GAS, ARTERIAL
Acid-base deficit: 5.2 mmol/L — ABNORMAL HIGH (ref 0.0–2.0)
Acid-base deficit: 8.1 mmol/L — ABNORMAL HIGH (ref 0.0–2.0)
BICARBONATE: 17.8 meq/L — AB (ref 20.0–24.0)
Bicarbonate: 17.2 mEq/L — ABNORMAL LOW (ref 20.0–24.0)
Drawn by: 313941
FIO2: 0.4 %
FIO2: 0.3 %
LHR: 20 {breaths}/min
MECHVT: 490 mL
MECHVT: 490 mL
O2 SAT: 99.3 %
O2 Saturation: 94.6 %
PCO2 ART: 24.8 mmHg — AB (ref 35.0–45.0)
PEEP/CPAP: 5 cmH2O
PEEP: 5 cmH2O
PH ART: 7.47 — AB (ref 7.350–7.450)
PH ART: 7.304 — AB (ref 7.350–7.450)
PO2 ART: 75.5 mmHg — AB (ref 80.0–100.0)
Patient temperature: 98.6
Patient temperature: 97
RATE: 18 resp/min
TCO2: 18.6 mmol/L (ref 0–100)
TCO2: 18.3 mmol/L (ref 0–100)
pCO2 arterial: 35.2 mmHg (ref 35.0–45.0)
pO2, Arterial: 145 mmHg — ABNORMAL HIGH (ref 80.0–100.0)

## 2014-09-14 LAB — BASIC METABOLIC PANEL
ANION GAP: 6 (ref 5–15)
Anion gap: 7 (ref 5–15)
BUN: 17 mg/dL (ref 6–23)
BUN: 17 mg/dL (ref 6–23)
CALCIUM: 7.6 mg/dL — AB (ref 8.4–10.5)
CHLORIDE: 109 meq/L (ref 96–112)
CO2: 18 mmol/L — ABNORMAL LOW (ref 19–32)
CO2: 17 mmol/L — ABNORMAL LOW (ref 19–32)
CREATININE: 0.94 mg/dL (ref 0.50–1.10)
Calcium: 7.8 mg/dL — ABNORMAL LOW (ref 8.4–10.5)
Chloride: 113 mEq/L — ABNORMAL HIGH (ref 96–112)
Creatinine, Ser: 0.98 mg/dL (ref 0.50–1.10)
GFR calc Af Amer: 67 mL/min — ABNORMAL LOW (ref >90)
GFR calc non Af Amer: 58 mL/min — ABNORMAL LOW (ref >90)
GFR, EST AFRICAN AMERICAN: 64 mL/min — AB (ref >90)
GFR, EST NON AFRICAN AMERICAN: 55 mL/min — AB (ref >90)
Glucose, Bld: 91 mg/dL (ref 70–99)
Glucose, Bld: 70 mg/dL (ref 70–99)
Potassium: 3.8 mmol/L (ref 3.5–5.1)
Potassium: 3.5 mmol/L (ref 3.5–5.1)
SODIUM: 133 mmol/L — AB (ref 135–145)
Sodium: 137 mmol/L (ref 135–145)

## 2014-09-14 LAB — POCT I-STAT 3, ART BLOOD GAS (G3+)
Acid-base deficit: 16 mmol/L — ABNORMAL HIGH (ref 0.0–2.0)
Bicarbonate: 13.2 mEq/L — ABNORMAL LOW (ref 20.0–24.0)
O2 Saturation: 100 %
PO2 ART: 299 mmHg — AB (ref 80.0–100.0)
TCO2: 15 mmol/L (ref 0–100)
pCO2 arterial: 43.8 mmHg (ref 35.0–45.0)
pH, Arterial: 7.089 — CL (ref 7.350–7.450)

## 2014-09-14 LAB — CBC WITH DIFFERENTIAL/PLATELET
BASOS ABS: 0 10*3/uL (ref 0.0–0.1)
BASOS PCT: 0 % (ref 0–1)
Eosinophils Absolute: 0.2 10*3/uL (ref 0.0–0.7)
Eosinophils Relative: 2 % (ref 0–5)
HCT: 24 % — ABNORMAL LOW (ref 36.0–46.0)
Hemoglobin: 8.2 g/dL — ABNORMAL LOW (ref 12.0–15.0)
Lymphocytes Relative: 14 % (ref 12–46)
Lymphs Abs: 1.7 10*3/uL (ref 0.7–4.0)
MCH: 28.4 pg (ref 26.0–34.0)
MCHC: 34.2 g/dL (ref 30.0–36.0)
MCV: 83 fL (ref 78.0–100.0)
Monocytes Absolute: 0.5 10*3/uL (ref 0.1–1.0)
Monocytes Relative: 4 % (ref 3–12)
NEUTROS ABS: 9.4 10*3/uL — AB (ref 1.7–7.7)
NEUTROS PCT: 80 % — AB (ref 43–77)
PLATELETS: 319 10*3/uL (ref 150–400)
RBC: 2.89 MIL/uL — AB (ref 3.87–5.11)
RDW: 14.4 % (ref 11.5–15.5)
WBC: 11.8 10*3/uL — ABNORMAL HIGH (ref 4.0–10.5)

## 2014-09-14 LAB — GLUCOSE, CAPILLARY
GLUCOSE-CAPILLARY: 80 mg/dL (ref 70–99)
Glucose-Capillary: 121 mg/dL — ABNORMAL HIGH (ref 70–99)
Glucose-Capillary: 61 mg/dL — ABNORMAL LOW (ref 70–99)
Glucose-Capillary: 71 mg/dL (ref 70–99)
Glucose-Capillary: 92 mg/dL (ref 70–99)
Glucose-Capillary: 89 mg/dL (ref 70–99)

## 2014-09-14 LAB — HEPARIN LEVEL (UNFRACTIONATED)
Heparin Unfractionated: 0.4 IU/mL (ref 0.30–0.70)
Heparin Unfractionated: <0.1 IU/mL — ABNORMAL LOW (ref 0.30–0.70)

## 2014-09-14 LAB — MAGNESIUM: MAGNESIUM: 1.5 mg/dL (ref 1.5–2.5)

## 2014-09-14 MED ORDER — IPRATROPIUM-ALBUTEROL 0.5-2.5 (3) MG/3ML IN SOLN
3.0000 mL | Freq: Four times a day (QID) | RESPIRATORY_TRACT | Status: DC
  Administered 2014-09-14: 3 mL via RESPIRATORY_TRACT
  Filled 2014-09-14: qty 3

## 2014-09-14 MED ORDER — SODIUM CHLORIDE 0.9 % IV BOLUS (SEPSIS)
750.0000 mL | Freq: Once | INTRAVENOUS | Status: AC
  Administered 2014-09-14: 750 mL via INTRAVENOUS

## 2014-09-14 MED ORDER — VALPROATE SODIUM 500 MG/5ML IV SOLN
500.0000 mg | Freq: Two times a day (BID) | INTRAVENOUS | Status: DC
  Administered 2014-09-14: 500 mg via INTRAVENOUS
  Filled 2014-09-14 (×3): qty 5

## 2014-09-14 MED ORDER — AMIODARONE HCL IN DEXTROSE 360-4.14 MG/200ML-% IV SOLN
INTRAVENOUS | Status: AC
  Filled 2014-09-14: qty 200

## 2014-09-14 MED ORDER — IPRATROPIUM-ALBUTEROL 0.5-2.5 (3) MG/3ML IN SOLN: 3.0000 mL | RESPIRATORY_TRACT | Status: DC

## 2014-09-14 MED ORDER — VITAL AF 1.2 CAL PO LIQD
1000.0000 mL | ORAL | Status: DC
  Administered 2014-09-15 – 2014-09-18 (×3): 1000 mL
  Filled 2014-09-14 (×8): qty 1000

## 2014-09-14 MED ORDER — AMIODARONE HCL IN DEXTROSE 360-4.14 MG/200ML-% IV SOLN
60.0000 mg/h | INTRAVENOUS | Status: AC
  Administered 2014-09-14 (×2): 60 mg/h via INTRAVENOUS
  Filled 2014-09-14: qty 200

## 2014-09-14 MED ORDER — AMIODARONE HCL IN DEXTROSE 360-4.14 MG/200ML-% IV SOLN
30.0000 mg/h | INTRAVENOUS | Status: DC
  Administered 2014-09-15 – 2014-09-19 (×6): 30 mg/h via INTRAVENOUS
  Filled 2014-09-14 (×19): qty 200

## 2014-09-14 MED ORDER — IPRATROPIUM-ALBUTEROL 0.5-2.5 (3) MG/3ML IN SOLN: 3.0000 mL | Freq: Four times a day (QID) | RESPIRATORY_TRACT | Status: DC | PRN

## 2014-09-14 MED ORDER — VITAL HIGH PROTEIN PO LIQD
1000.0000 mL | ORAL | Status: DC
  Administered 2014-09-14: 1000 mL
  Filled 2014-09-14 (×2): qty 1000

## 2014-09-14 MED ORDER — HEPARIN (PORCINE) IN NACL 100-0.45 UNIT/ML-% IJ SOLN
600.0000 [IU]/h | INTRAMUSCULAR | Status: DC
  Administered 2014-09-14: 500 [IU]/h via INTRAVENOUS
  Administered 2014-09-15: 600 [IU]/h via INTRAVENOUS
  Filled 2014-09-14 (×2): qty 250

## 2014-09-14 MED ORDER — AMIODARONE LOAD VIA INFUSION
150.0000 mg | Freq: Once | INTRAVENOUS | Status: AC
  Administered 2014-09-14: 150 mg via INTRAVENOUS
  Filled 2014-09-14: qty 83.34

## 2014-09-14 MED ORDER — LEVETIRACETAM IN NACL 500 MG/100ML IV SOLN
500.0000 mg | Freq: Two times a day (BID) | INTRAVENOUS | Status: DC
  Administered 2014-09-14: 500 mg via INTRAVENOUS
  Filled 2014-09-14 (×2): qty 100

## 2014-09-14 MED ORDER — VALPROATE SODIUM 500 MG/5ML IV SOLN
1000.0000 mg | Freq: Once | INTRAVENOUS | Status: AC
  Administered 2014-09-14: 1000 mg via INTRAVENOUS
  Filled 2014-09-14: qty 10

## 2014-09-14 NOTE — Consult Note (Signed)
Reason for Consult: Anoxic brain injury.  HPI:                                                                                                                                          [name] is an 74 y.o. female with a history of PAF, COPD, hyperlipidemia, diabetes mellitus and hypothyroidism, who was admitted on 09/12/2013 following cardiac arrest with ventricular fibrillation. Total down time reportedly was about 20 minutes. Patient underwent hypothermia protocol and has now been rewarmed. She was noted to have muscle twitching during rewarming up to possibly be seizure activity. An EEG was obtained this morning which showed diffuse muscle artifact as well as rhythmic muscle twitching. No clear epileptiform activity was recorded. Patient has remained unresponsive except for eye-opening to external stimuli. CT scan of her head on the day of admission showed no acute intracranial abnormality.   Past Medical History  Diagnosis Date  . PAF (paroxysmal atrial fibrillation)   . COPD (chronic obstructive pulmonary disease)   . Hyperlipidemia   . Hypothyroidism   . Arthritis   . Diabetes mellitus   . History of EMG 11/00    negative  . Ventricular fibrillation 09/16/2014  . Cardiac arrest 09/14/2014    Past Surgical History  Procedure Laterality Date  . Ankle fracture surgery      left  . Thyroid colloid cyst   12/00  . Cardiac catheterization  06/16/05    Lmain 20%, LAD 20/40%, D1 & D2 30%, CFX 30%, RCA 50%, EF 75%   . Left heart catheterization with coronary angiogram N/A 09/05/2014    Procedure: LEFT HEART CATHETERIZATION WITH CORONARY ANGIOGRAM;  Surgeon: Troy Sine, MD;  Location: [facility] CATH LAB;  Service: Cardiovascular;  Laterality: N/A;  . Left heart cath Bilateral 09/20/2014    Procedure: LEFT HEART CATH;  Surgeon: Blane Ohara, MD;  Location: [facility] CATH LAB;  Service: Cardiovascular;  Laterality: Bilateral;    Family History  Problem Relation Age of Onset  . Cancer Mother      breast    Social History:  reports that she has been smoking Cigarettes.  She has been smoking about 0.50 packs per day. She has never used smokeless tobacco. She reports that she drinks about 30.0 oz of alcohol per week. She reports that she does not use illicit drugs.  Allergies  Allergen Reactions  . Atorvastatin Other (See Comments)    Muscle aches  . Hydrochlorothiazide W-Triamterene Other (See Comments)     palpitations  . Penicillins Hives  . Statins Other (See Comments)    Subjective myalgia    MEDICATIONS:  I have reviewed the patient's current medications.   ROS:                                                                                                                                       History obtained from unobtainable from patient due to mental status  Blood pressure 150/78, pulse 42, temperature 98.6 F (37 C), temperature source Core (Comment), resp. rate 18, height 5' 2" (1.575 m), weight 59.63 kg (131 lb 7.4 oz), SpO2 100 %. Appearance was that of an elderly lady of medium build who was intubated and on mechanical ventilation. Patient appeared to have spontaneous respirations as well. She was unresponsive to external stimuli, although eyes were open. There was no visual tracking. She did not follow any commands.  Neurologic Examination:                                                                                                      Pupils were equal and reacted sluggishly to light. Extraocular movements were intact and conjugate with oculocephalic maneuvers he No facial asymmetry was noted. Muscle tone was flaccid throughout. Patient had no abnormal posturing. He had frequent head and neck clonic-like movements which also involved the upper extremity. Deep tendon reflexes were absent throughout. She had bilateral been Babinski  signs.  Lab Results  Component Value Date/Time   CHOL 192 09/06/2014 12:05 AM    Results for orders placed or performed during the hospital encounter of 09/16/2014 (from the past 48 hour(s))  POCT i-Stat troponin I     Status: Abnormal   Collection Time: 09/16/2014  4:41 PM  Result Value Ref Range   Troponin i, poc 4.99 (HH) 0.00 - 0.08 ng/mL   Comment NOTIFIED PHYSICIAN    Comment 3            Comment: Due to the release kinetics of cTnI, a negative result within the first hours of the onset of symptoms does not rule out myocardial infarction with certainty. If myocardial infarction is still suspected, repeat the test at appropriate intervals.   TSH     Status: None   Collection Time: 10/02/2014  7:30 PM  Result Value Ref Range   TSH 4.093 0.350 - 4.500 uIU/mL  Brain natriuretic peptide     Status: Abnormal   Collection Time: 09/22/2014  7:30 PM  Result Value Ref Range   B Natriuretic Peptide 1361.7 (H) 0.0 - 100.0 pg/mL    Comment: Please note change in reference  range.  Troponin I     Status: Abnormal   Collection Time: 09/29/2014  7:30 PM  Result Value Ref Range   Troponin I 3.62 (HH) <0.031 ng/mL    Comment:        POSSIBLE MYOCARDIAL ISCHEMIA. SERIAL TESTING RECOMMENDED. Please note change in reference range. REPEATED TO VERIFY CRITICAL RESULT CALLED TO, READ BACK BY AND VERIFIED WITH: GUFFEY A,RN 10/03/2014 [id number]   Protime-INR now and repeat in 8 hours     Status: Abnormal   Collection Time: 09/06/2014  7:30 PM  Result Value Ref Range   Prothrombin Time 18.2 (H) 11.6 - 15.2 seconds   INR 1.50 (H) 0.00 - 1.49  APTT now and repeat in 8 hours     Status: Abnormal   Collection Time: 09/26/2014  7:30 PM  Result Value Ref Range   aPTT 66 (H) 24 - 37 seconds    Comment:        IF BASELINE aPTT IS ELEVATED, SUGGEST PATIENT RISK ASSESSMENT BE USED TO DETERMINE APPROPRIATE ANTICOAGULANT THERAPY.   Comprehensive metabolic panel     Status: Abnormal   Collection Time:  09/17/2014  7:30 PM  Result Value Ref Range   Sodium 136 135 - 145 mmol/L    Comment: Please note change in reference range.   Potassium 4.0 3.5 - 5.1 mmol/L    Comment: Please note change in reference range. DELTA CHECK NOTED    Chloride 113 (H) 96 - 112 mEq/L   CO2 17 (L) 19 - 32 mmol/L   Glucose, Bld 301 (H) 70 - 99 mg/dL   BUN 12 6 - 23 mg/dL   Creatinine, Ser 0.86 0.50 - 1.10 mg/dL   Calcium 6.6 (L) 8.4 - 10.5 mg/dL   Total Protein 4.1 (L) 6.0 - 8.3 g/dL   Albumin 2.0 (L) 3.5 - 5.2 g/dL   AST 179 (H) 0 - 37 U/L   ALT 143 (H) 0 - 35 U/L   Alkaline Phosphatase 104 39 - 117 U/L   Total Bilirubin 0.8 0.3 - 1.2 mg/dL   GFR calc non Af Amer 64 (L) >90 mL/min   GFR calc Af Amer 75 (L) >90 mL/min    Comment: (NOTE) The eGFR has been calculated using the CKD EPI equation. This calculation has not been validated in all clinical situations. eGFR's persistently <90 mL/min signify possible Chronic Kidney Disease.    Anion gap 6 5 - 15  Magnesium     Status: None   Collection Time: 09/05/2014  7:30 PM  Result Value Ref Range   Magnesium 1.6 1.5 - 2.5 mg/dL  CBC with Differential     Status: Abnormal   Collection Time: 09/06/2014  7:30 PM  Result Value Ref Range   WBC 24.4 (H) 4.0 - 10.5 K/uL   RBC 3.04 (L) 3.87 - 5.11 MIL/uL   Hemoglobin 8.9 (L) 12.0 - 15.0 g/dL   HCT 26.8 (L) 36.0 - 46.0 %   MCV 88.2 78.0 - 100.0 fL   MCH 29.3 26.0 - 34.0 pg   MCHC 33.2 30.0 - 36.0 g/dL   RDW 15.0 11.5 - 15.5 %   Platelets 347 150 - 400 K/uL   Neutrophils Relative % 88 (H) 43 - 77 %   Neutro Abs 21.5 (H) 1.7 - 7.7 K/uL   Lymphocytes Relative 5 (L) 12 - 46 %   Lymphs Abs 1.2 0.7 - 4.0 K/uL   Monocytes Relative 7 3 - 12 %   Monocytes Absolute  1.7 (H) 0.1 - 1.0 K/uL   Eosinophils Relative 0 0 - 5 %   Eosinophils Absolute 0.0 0.0 - 0.7 K/uL   Basophils Relative 0 0 - 1 %   Basophils Absolute 0.0 0.0 - 0.1 K/uL  Glucose, capillary     Status: Abnormal   Collection Time: 09/11/2014  7:37 PM  Result  Value Ref Range   Glucose-Capillary 271 (H) 70 - 99 mg/dL  I-STAT, chem 8     Status: Abnormal   Collection Time: 09/29/2014  7:43 PM  Result Value Ref Range   Sodium 141 135 - 145 mmol/L   Potassium 4.1 3.5 - 5.1 mmol/L   Chloride 110 96 - 112 mEq/L   BUN 11 6 - 23 mg/dL   Creatinine, Ser 0.70 0.50 - 1.10 mg/dL   Glucose, Bld 297 (H) 70 - 99 mg/dL   Calcium, Ion 1.14 1.13 - 1.30 mmol/L   TCO2 15 0 - 100 mmol/L   Hemoglobin 9.5 (L) 12.0 - 15.0 g/dL   HCT 28.0 (L) 36.0 - 46.0 %  Glucose, capillary     Status: Abnormal   Collection Time: 10/04/2014  8:51 PM  Result Value Ref Range   Glucose-Capillary 348 (H) 70 - 99 mg/dL   Comment 1 Venous Sample   I-STAT, chem 8     Status: Abnormal   Collection Time: 09/06/2014  8:55 PM  Result Value Ref Range   Sodium 139 135 - 145 mmol/L   Potassium 3.9 3.5 - 5.1 mmol/L   Chloride 106 96 - 112 mEq/L   BUN 12 6 - 23 mg/dL   Creatinine, Ser 0.80 0.50 - 1.10 mg/dL   Glucose, Bld 352 (H) 70 - 99 mg/dL   Calcium, Ion 1.16 1.13 - 1.30 mmol/L   TCO2 15 0 - 100 mmol/L   Hemoglobin 9.9 (L) 12.0 - 15.0 g/dL   HCT 29.0 (L) 36.0 - 46.0 %  Glucose, capillary     Status: Abnormal   Collection Time: 09/05/2014  9:52 PM  Result Value Ref Range   Glucose-Capillary 306 (H) 70 - 99 mg/dL   Comment 1 Arterial Sample   I-STAT, chem 8     Status: Abnormal   Collection Time: 09/29/2014  9:57 PM  Result Value Ref Range   Sodium 135 135 - 145 mmol/L   Potassium 3.7 3.5 - 5.1 mmol/L   Chloride 111 96 - 112 mEq/L   BUN 13 6 - 23 mg/dL   Creatinine, Ser 0.80 0.50 - 1.10 mg/dL   Glucose, Bld 347 (H) 70 - 99 mg/dL   Calcium, Ion 1.11 (L) 1.13 - 1.30 mmol/L   TCO2 15 0 - 100 mmol/L   Hemoglobin 10.5 (L) 12.0 - 15.0 g/dL   HCT 31.0 (L) 36.0 - 46.0 %  Glucose, capillary     Status: Abnormal   Collection Time: 09/13/2014 10:58 PM  Result Value Ref Range   Glucose-Capillary 250 (H) 70 - 99 mg/dL   Comment 1 Arterial Sample   Arterial Blood Gas     Status: Abnormal    Collection Time: 09/18/2014 11:20 PM  Result Value Ref Range   FIO2 50.00 %   Delivery systems VENTILATOR    Mode PRESSURE REGULATED VOLUME CONTROL    VT 490 mL   Rate 20 resp/min   Peep/cpap 5.0 cm H20   pH, Arterial 7.370 7.350 - 7.450   pCO2 arterial 29.6 (L) 35.0 - 45.0 mmHg   pO2, Arterial 165.0 (H) 80.0 - 100.0  mmHg   Bicarbonate 16.7 (L) 20.0 - 24.0 mEq/L   TCO2 17.6 0 - 100 mmol/L   Acid-base deficit 7.6 (H) 0.0 - 2.0 mmol/L   O2 Saturation 98.9 %   Patient temperature 98.6    Collection site ARTERIAL LINE    Drawn by [id number]    Sample type ARTERIAL DRAW    Allens test (pass/fail) PASS PASS  Troponin I     Status: Abnormal   Collection Time: 09/08/2014 11:55 PM  Result Value Ref Range   Troponin I 2.49 (HH) <0.031 ng/mL    Comment:        POSSIBLE MYOCARDIAL ISCHEMIA. SERIAL TESTING RECOMMENDED. Please note change in reference range. REPEATED TO VERIFY CRITICAL VALUE NOTED.  VALUE IS CONSISTENT WITH PREVIOUSLY REPORTED AND CALLED VALUE.   Basic metabolic panel     Status: Abnormal   Collection Time: 09/24/2014 11:55 PM  Result Value Ref Range   Sodium 138 135 - 145 mmol/L    Comment: Please note change in reference range.   Potassium 3.3 (L) 3.5 - 5.1 mmol/L    Comment: Please note change in reference range.   Chloride 113 (H) 96 - 112 mEq/L   CO2 17 (L) 19 - 32 mmol/L   Glucose, Bld 216 (H) 70 - 99 mg/dL   BUN 13 6 - 23 mg/dL   Creatinine, Ser 0.83 0.50 - 1.10 mg/dL   Calcium 7.2 (L) 8.4 - 10.5 mg/dL   GFR calc non Af Amer 67 (L) >90 mL/min   GFR calc Af Amer 78 (L) >90 mL/min    Comment: (NOTE) The eGFR has been calculated using the CKD EPI equation. This calculation has not been validated in all clinical situations. eGFR's persistently <90 mL/min signify possible Chronic Kidney Disease.    Anion gap 8 5 - 15  Glucose, capillary     Status: Abnormal   Collection Time: 09/13/14 12:04 AM  Result Value Ref Range   Glucose-Capillary 194 (H) 70 - 99 mg/dL    Comment 1 Arterial Sample   Glucose, capillary     Status: Abnormal   Collection Time: 09/13/14  1:08 AM  Result Value Ref Range   Glucose-Capillary 165 (H) 70 - 99 mg/dL   Comment 1 Arterial Sample   Basic metabolic panel     Status: Abnormal   Collection Time: 09/13/14  2:00 AM  Result Value Ref Range   Sodium 138 135 - 145 mmol/L    Comment: Please note change in reference range.   Potassium 3.2 (L) 3.5 - 5.1 mmol/L    Comment: Please note change in reference range.   Chloride 113 (H) 96 - 112 mEq/L   CO2 18 (L) 19 - 32 mmol/L   Glucose, Bld 152 (H) 70 - 99 mg/dL   BUN 14 6 - 23 mg/dL   Creatinine, Ser 0.85 0.50 - 1.10 mg/dL   Calcium 7.6 (L) 8.4 - 10.5 mg/dL   GFR calc non Af Amer 65 (L) >90 mL/min   GFR calc Af Amer 76 (L) >90 mL/min    Comment: (NOTE) The eGFR has been calculated using the CKD EPI equation. This calculation has not been validated in all clinical situations. eGFR's persistently <90 mL/min signify possible Chronic Kidney Disease.    Anion gap 7 5 - 15  Glucose, capillary     Status: Abnormal   Collection Time: 09/13/14  2:10 AM  Result Value Ref Range   Glucose-Capillary 139 (H) 70 - 99 mg/dL  Comment 1 Arterial Sample   Glucose, capillary     Status: Abnormal   Collection Time: 09/13/14  3:09 AM  Result Value Ref Range   Glucose-Capillary 140 (H) 70 - 99 mg/dL   Comment 1 Arterial Sample   APTT     Status: Abnormal   Collection Time: 09/13/14  3:15 AM  Result Value Ref Range   aPTT 135 (H) 24 - 37 seconds    Comment:        IF BASELINE aPTT IS ELEVATED, SUGGEST PATIENT RISK ASSESSMENT BE USED TO DETERMINE APPROPRIATE ANTICOAGULANT THERAPY.   Protime-INR     Status: Abnormal   Collection Time: 09/13/14  3:15 AM  Result Value Ref Range   Prothrombin Time 17.1 (H) 11.6 - 15.2 seconds   INR 1.38 0.00 - 1.49  Glucose, capillary     Status: Abnormal   Collection Time: 09/13/14  4:02 AM  Result Value Ref Range   Glucose-Capillary 130 (H) 70 - 99  mg/dL   Comment 1 Arterial Sample   Glucose, capillary     Status: Abnormal   Collection Time: 09/13/14  5:20 AM  Result Value Ref Range   Glucose-Capillary 120 (H) 70 - 99 mg/dL   Comment 1 Arterial Sample   Troponin I-(serum)     Status: Abnormal   Collection Time: 09/13/14  5:30 AM  Result Value Ref Range   Troponin I 2.42 (HH) <0.031 ng/mL    Comment:        POSSIBLE MYOCARDIAL ISCHEMIA. SERIAL TESTING RECOMMENDED. Please note change in reference range. REPEATED TO VERIFY CRITICAL VALUE NOTED.  VALUE IS CONSISTENT WITH PREVIOUSLY REPORTED AND CALLED VALUE.   Basic metabolic panel     Status: Abnormal   Collection Time: 09/13/14  5:30 AM  Result Value Ref Range   Sodium 137 135 - 145 mmol/L    Comment: Please note change in reference range.   Potassium 4.6 3.5 - 5.1 mmol/L    Comment: Please note change in reference range. DELTA CHECK NOTED    Chloride 114 (H) 96 - 112 mEq/L   CO2 18 (L) 19 - 32 mmol/L   Glucose, Bld 122 (H) 70 - 99 mg/dL   BUN 15 6 - 23 mg/dL   Creatinine, Ser 0.87 0.50 - 1.10 mg/dL   Calcium 7.7 (L) 8.4 - 10.5 mg/dL   GFR calc non Af Amer 64 (L) >90 mL/min   GFR calc Af Amer 74 (L) >90 mL/min    Comment: (NOTE) The eGFR has been calculated using the CKD EPI equation. This calculation has not been validated in all clinical situations. eGFR's persistently <90 mL/min signify possible Chronic Kidney Disease.    Anion gap 5 5 - 15  CBC     Status: Abnormal   Collection Time: 09/13/14  5:30 AM  Result Value Ref Range   WBC 18.8 (H) 4.0 - 10.5 K/uL   RBC 3.12 (L) 3.87 - 5.11 MIL/uL   Hemoglobin 9.0 (L) 12.0 - 15.0 g/dL   HCT 26.4 (L) 36.0 - 46.0 %   MCV 84.6 78.0 - 100.0 fL   MCH 28.8 26.0 - 34.0 pg   MCHC 34.1 30.0 - 36.0 g/dL   RDW 14.5 11.5 - 15.5 %   Platelets 321 150 - 400 K/uL  Heparin level (unfractionated)     Status: Abnormal   Collection Time: 09/13/14  5:30 AM  Result Value Ref Range   Heparin Unfractionated 0.92 (H) 0.30 - 0.70  IU/mL  Comment: RESULTS CONFIRMED BY MANUAL DILUTION        IF HEPARIN RESULTS ARE BELOW EXPECTED VALUES, AND PATIENT DOSAGE HAS BEEN CONFIRMED, SUGGEST FOLLOW UP TESTING OF ANTITHROMBIN III LEVELS.   Glucose, capillary     Status: Abnormal   Collection Time: 09/13/14  6:25 AM  Result Value Ref Range   Glucose-Capillary 115 (H) 70 - 99 mg/dL   Comment 1 Arterial Sample   Glucose, capillary     Status: Abnormal   Collection Time: 09/13/14  7:30 AM  Result Value Ref Range   Glucose-Capillary 136 (H) 70 - 99 mg/dL   Comment 1 Arterial Sample   Glucose, capillary     Status: Abnormal   Collection Time: 09/13/14  9:24 AM  Result Value Ref Range   Glucose-Capillary 117 (H) 70 - 99 mg/dL   Comment 1 Arterial Sample   Basic metabolic panel     Status: Abnormal   Collection Time: 09/13/14  9:40 AM  Result Value Ref Range   Sodium 135 135 - 145 mmol/L    Comment: Please note change in reference range.   Potassium 4.3 3.5 - 5.1 mmol/L    Comment: Please note change in reference range.   Chloride 110 96 - 112 mEq/L   CO2 17 (L) 19 - 32 mmol/L   Glucose, Bld 117 (H) 70 - 99 mg/dL   BUN 16 6 - 23 mg/dL   Creatinine, Ser 0.89 0.50 - 1.10 mg/dL   Calcium 7.7 (L) 8.4 - 10.5 mg/dL   GFR calc non Af Amer 62 (L) >90 mL/min   GFR calc Af Amer 72 (L) >90 mL/min    Comment: (NOTE) The eGFR has been calculated using the CKD EPI equation. This calculation has not been validated in all clinical situations. eGFR's persistently <90 mL/min signify possible Chronic Kidney Disease.    Anion gap 8 5 - 15  Glucose, capillary     Status: None   Collection Time: 09/13/14 11:37 AM  Result Value Ref Range   Glucose-Capillary 99 70 - 99 mg/dL   Comment 1 Arterial Sample   Troponin I     Status: Abnormal   Collection Time: 09/13/14 11:55 AM  Result Value Ref Range   Troponin I 2.13 (HH) <0.031 ng/mL    Comment:        POSSIBLE MYOCARDIAL ISCHEMIA. SERIAL TESTING RECOMMENDED. Please note change  in reference range. REPEATED TO VERIFY CRITICAL VALUE NOTED.  VALUE IS CONSISTENT WITH PREVIOUSLY REPORTED AND CALLED VALUE.   APTT     Status: Abnormal   Collection Time: 09/13/14 11:55 AM  Result Value Ref Range   aPTT 193 (H) 24 - 37 seconds    Comment:        IF BASELINE aPTT IS ELEVATED, SUGGEST PATIENT RISK ASSESSMENT BE USED TO DETERMINE APPROPRIATE ANTICOAGULANT THERAPY.   Heparin level (unfractionated)     Status: Abnormal   Collection Time: 09/13/14 12:00 PM  Result Value Ref Range   Heparin Unfractionated 0.94 (H) 0.30 - 0.70 IU/mL    Comment:        IF HEPARIN RESULTS ARE BELOW EXPECTED VALUES, AND PATIENT DOSAGE HAS BEEN CONFIRMED, SUGGEST FOLLOW UP TESTING OF ANTITHROMBIN III LEVELS.   Glucose, capillary     Status: None   Collection Time: 09/13/14  1:32 PM  Result Value Ref Range   Glucose-Capillary 98 70 - 99 mg/dL  Basic metabolic panel     Status: Abnormal   Collection Time: 09/13/14  1:35  PM  Result Value Ref Range   Sodium 137 135 - 145 mmol/L    Comment: Please note change in reference range.   Potassium 4.3 3.5 - 5.1 mmol/L    Comment: Please note change in reference range.   Chloride 113 (H) 96 - 112 mEq/L   CO2 16 (L) 19 - 32 mmol/L   Glucose, Bld 102 (H) 70 - 99 mg/dL   BUN 16 6 - 23 mg/dL   Creatinine, Ser 0.97 0.50 - 1.10 mg/dL   Calcium 7.8 (L) 8.4 - 10.5 mg/dL   GFR calc non Af Amer 56 (L) >90 mL/min   GFR calc Af Amer 65 (L) >90 mL/min    Comment: (NOTE) The eGFR has been calculated using the CKD EPI equation. This calculation has not been validated in all clinical situations. eGFR's persistently <90 mL/min signify possible Chronic Kidney Disease.    Anion gap 8 5 - 15  Blood gas, arterial     Status: Abnormal   Collection Time: 09/13/14  3:15 PM  Result Value Ref Range   FIO2 0.40 %   Delivery systems VENTILATOR    VT 490 mL   Rate 20 resp/min   Peep/cpap 5.0 cm H20   pH, Arterial 7.470 (H) 7.350 - 7.450   pCO2 arterial 24.8  (L) 35.0 - 45.0 mmHg   pO2, Arterial 145.0 (H) 80.0 - 100.0 mmHg   Bicarbonate 17.8 (L) 20.0 - 24.0 mEq/L   TCO2 18.6 0 - 100 mmol/L   Acid-base deficit 5.2 (H) 0.0 - 2.0 mmol/L   O2 Saturation 99.3 %   Patient temperature 98.6    Collection site A-LINE    Drawn by [id number]    Sample type ARTERIAL DRAW   Culture, respiratory (NON-Expectorated)     Status: None (Preliminary result)   Collection Time: 09/13/14  3:15 PM  Result Value Ref Range   Specimen Description TRACHEAL ASPIRATE    Special Requests NONE    Gram Stain PENDING    Culture NO GROWTH Performed at [facility]     Report Status PENDING   Glucose, capillary     Status: None   Collection Time: 09/13/14  3:46 PM  Result Value Ref Range   Glucose-Capillary 93 70 - 99 mg/dL  Glucose, capillary     Status: None   Collection Time: 09/13/14  5:44 PM  Result Value Ref Range   Glucose-Capillary 86 70 - 99 mg/dL  Basic metabolic panel     Status: Abnormal   Collection Time: 09/13/14  5:50 PM  Result Value Ref Range   Sodium 138 135 - 145 mmol/L    Comment: Please note change in reference range.   Potassium 3.9 3.5 - 5.1 mmol/L    Comment: Please note change in reference range.   Chloride 112 96 - 112 mEq/L   CO2 15 (L) 19 - 32 mmol/L   Glucose, Bld 91 70 - 99 mg/dL   BUN 17 6 - 23 mg/dL   Creatinine, Ser 0.86 0.50 - 1.10 mg/dL   Calcium 8.0 (L) 8.4 - 10.5 mg/dL   GFR calc non Af Amer 64 (L) >90 mL/min   GFR calc Af Amer 75 (L) >90 mL/min    Comment: (NOTE) The eGFR has been calculated using the CKD EPI equation. This calculation has not been validated in all clinical situations. eGFR's persistently <90 mL/min signify possible Chronic Kidney Disease.    Anion gap 11 5 - 15  Glucose, capillary  Status: None   Collection Time: 09/13/14  7:34 PM  Result Value Ref Range   Glucose-Capillary 80 70 - 99 mg/dL  APTT     Status: Abnormal   Collection Time: 09/13/14  7:35 PM  Result Value Ref Range   aPTT 67  (H) 24 - 37 seconds    Comment:        IF BASELINE aPTT IS ELEVATED, SUGGEST PATIENT RISK ASSESSMENT BE USED TO DETERMINE APPROPRIATE ANTICOAGULANT THERAPY.   Heparin level (unfractionated)     Status: None   Collection Time: 09/13/14  7:55 PM  Result Value Ref Range   Heparin Unfractionated 0.56 0.30 - 0.70 IU/mL    Comment:        IF HEPARIN RESULTS ARE BELOW EXPECTED VALUES, AND PATIENT DOSAGE HAS BEEN CONFIRMED, SUGGEST FOLLOW UP TESTING OF ANTITHROMBIN III LEVELS.   Basic metabolic panel     Status: Abnormal   Collection Time: 09/13/14  9:55 PM  Result Value Ref Range   Sodium 139 135 - 145 mmol/L    Comment: Please note change in reference range.   Potassium 4.3 3.5 - 5.1 mmol/L    Comment: Please note change in reference range.   Chloride 112 96 - 112 mEq/L   CO2 15 (L) 19 - 32 mmol/L   Glucose, Bld 70 70 - 99 mg/dL   BUN 17 6 - 23 mg/dL   Creatinine, Ser 0.95 0.50 - 1.10 mg/dL   Calcium 7.9 (L) 8.4 - 10.5 mg/dL   GFR calc non Af Amer 57 (L) >90 mL/min   GFR calc Af Amer 66 (L) >90 mL/min    Comment: (NOTE) The eGFR has been calculated using the CKD EPI equation. This calculation has not been validated in all clinical situations. eGFR's persistently <90 mL/min signify possible Chronic Kidney Disease.    Anion gap 12 5 - 15  Glucose, capillary     Status: Abnormal   Collection Time: 09/13/14 11:33 PM  Result Value Ref Range   Glucose-Capillary 61 (L) 70 - 99 mg/dL  Glucose, capillary     Status: Abnormal   Collection Time: 09/14/14 12:09 AM  Result Value Ref Range   Glucose-Capillary 121 (H) 70 - 99 mg/dL  Basic metabolic panel     Status: Abnormal   Collection Time: 09/14/14  2:13 AM  Result Value Ref Range   Sodium 137 135 - 145 mmol/L    Comment: Please note change in reference range.   Potassium 3.8 3.5 - 5.1 mmol/L    Comment: Please note change in reference range.   Chloride 113 (H) 96 - 112 mEq/L   CO2 18 (L) 19 - 32 mmol/L   Glucose, Bld 91 70 -  99 mg/dL   BUN 17 6 - 23 mg/dL   Creatinine, Ser 0.94 0.50 - 1.10 mg/dL   Calcium 7.8 (L) 8.4 - 10.5 mg/dL   GFR calc non Af Amer 58 (L) >90 mL/min   GFR calc Af Amer 67 (L) >90 mL/min    Comment: (NOTE) The eGFR has been calculated using the CKD EPI equation. This calculation has not been validated in all clinical situations. eGFR's persistently <90 mL/min signify possible Chronic Kidney Disease.    Anion gap 6 5 - 15  Glucose, capillary     Status: None   Collection Time: 09/14/14  4:47 AM  Result Value Ref Range   Glucose-Capillary 71 70 - 99 mg/dL  Heparin level (unfractionated)     Status: None  Collection Time: 09/14/14  5:00 AM  Result Value Ref Range   Heparin Unfractionated 0.40 0.30 - 0.70 IU/mL    Comment:        IF HEPARIN RESULTS ARE BELOW EXPECTED VALUES, AND PATIENT DOSAGE HAS BEEN CONFIRMED, SUGGEST FOLLOW UP TESTING OF ANTITHROMBIN III LEVELS.   CBC with Differential     Status: Abnormal   Collection Time: 09/14/14  5:00 AM  Result Value Ref Range   WBC 11.8 (H) 4.0 - 10.5 K/uL   RBC 2.89 (L) 3.87 - 5.11 MIL/uL   Hemoglobin 8.2 (L) 12.0 - 15.0 g/dL   HCT 24.0 (L) 36.0 - 46.0 %   MCV 83.0 78.0 - 100.0 fL   MCH 28.4 26.0 - 34.0 pg   MCHC 34.2 30.0 - 36.0 g/dL   RDW 14.4 11.5 - 15.5 %   Platelets 319 150 - 400 K/uL   Neutrophils Relative % 80 (H) 43 - 77 %   Neutro Abs 9.4 (H) 1.7 - 7.7 K/uL   Lymphocytes Relative 14 12 - 46 %   Lymphs Abs 1.7 0.7 - 4.0 K/uL   Monocytes Relative 4 3 - 12 %   Monocytes Absolute 0.5 0.1 - 1.0 K/uL   Eosinophils Relative 2 0 - 5 %   Eosinophils Absolute 0.2 0.0 - 0.7 K/uL   Basophils Relative 0 0 - 1 %   Basophils Absolute 0.0 0.0 - 0.1 K/uL  Basic metabolic panel     Status: Abnormal   Collection Time: 09/14/14  5:00 AM  Result Value Ref Range   Sodium 133 (L) 135 - 145 mmol/L    Comment: Please note change in reference range.   Potassium 3.5 3.5 - 5.1 mmol/L    Comment: Please note change in reference range.    Chloride 109 96 - 112 mEq/L   CO2 17 (L) 19 - 32 mmol/L   Glucose, Bld 70 70 - 99 mg/dL   BUN 17 6 - 23 mg/dL   Creatinine, Ser 0.98 0.50 - 1.10 mg/dL   Calcium 7.6 (L) 8.4 - 10.5 mg/dL   GFR calc non Af Amer 55 (L) >90 mL/min   GFR calc Af Amer 64 (L) >90 mL/min    Comment: (NOTE) The eGFR has been calculated using the CKD EPI equation. This calculation has not been validated in all clinical situations. eGFR's persistently <90 mL/min signify possible Chronic Kidney Disease.    Anion gap 7 5 - 15  Magnesium     Status: None   Collection Time: 09/14/14  5:00 AM  Result Value Ref Range   Magnesium 1.5 1.5 - 2.5 mg/dL  Blood gas, arterial     Status: Abnormal   Collection Time: 09/14/14  7:50 AM  Result Value Ref Range   FIO2 0.30 %   Delivery systems VENTILATOR    Mode PRESSURE REGULATED VOLUME CONTROL    VT 490.0 mL   Rate 18.0 resp/min   Peep/cpap 5.0 cm H20   pH, Arterial 7.304 (L) 7.350 - 7.450   pCO2 arterial 35.2 35.0 - 45.0 mmHg   pO2, Arterial 75.5 (L) 80.0 - 100.0 mmHg   Bicarbonate 17.2 (L) 20.0 - 24.0 mEq/L   TCO2 18.3 0 - 100 mmol/L   Acid-base deficit 8.1 (H) 0.0 - 2.0 mmol/L   O2 Saturation 94.6 %   Patient temperature 97.0    Collection site A-LINE    Drawn by COLLECTED BY NURSE    Sample type ARTERIAL DRAW    Allens  test (pass/fail) PASS PASS  Glucose, capillary     Status: None   Collection Time: 09/14/14  7:50 AM  Result Value Ref Range   Glucose-Capillary 92 70 - 99 mg/dL   Comment 1 Arterial Sample   Glucose, capillary     Status: None   Collection Time: 09/14/14 11:36 AM  Result Value Ref Range   Glucose-Capillary 80 70 - 99 mg/dL  Heparin level (unfractionated)     Status: Abnormal   Collection Time: 09/14/14  1:00 PM  Result Value Ref Range   Heparin Unfractionated <0.10 (L) 0.30 - 0.70 IU/mL    Comment:        IF HEPARIN RESULTS ARE BELOW EXPECTED VALUES, AND PATIENT DOSAGE HAS BEEN CONFIRMED, SUGGEST FOLLOW UP TESTING OF ANTITHROMBIN  III LEVELS. SPECIMEN CHECKED FOR CLOTS REPEATED TO VERIFY     Dg Chest Port 1 View  09/13/2014   CLINICAL DATA:  Respiratory failure  EXAM: PORTABLE CHEST - 1 VIEW  COMPARISON:  September 12, 2014  FINDINGS: Endotracheal tube tip is 5.4 cm above the carina. Nasogastric tube tip and side port are in the stomach. Central catheter tip is in the superior vena cava. No pneumothorax. There is underlying emphysema. There is significantly less interstitial edema compared to 1 day prior. There is no consolidation or volume loss. The heart is upper normal in size with pulmonary vascularity within normal limits. No adenopathy.  IMPRESSION: Considerably less interstitial edema compared to 1 day prior. No new opacity. Underlying emphysema. No change in cardiac silhouette. Tube and catheter positions as described without pneumothorax.   Electronically Signed   By: Lowella Grip M.D.   On: 09/13/2014 15:19   Dg Chest Port 1 View  09/09/2014   CLINICAL DATA:  Central line evaluation.  Intubated patient.  EXAM: PORTABLE CHEST - 1 VIEW  COMPARISON:  Radiograph 09/15/2014  FINDINGS: Endotracheal tube and NG tube are unchanged. Interval placement of a right central venous line with tip in the distal SVC. No pneumothorax.  Stable cardiac silhouette. There is diffuse bilateral airspace disease more dense on the right. No pleural fluid.  IMPRESSION: 1. Interval placement right central venous line without complication. 2. Diffuse airspace disease suggesting pulmonary edema, not changed from prior. 3. Other support apparatus stable.   Electronically Signed   By: Suzy Bouchard M.D.   On: 09/29/2014 19:31   Ct Portable Head W/o Cm  09/25/2014   CLINICAL DATA:  Initial evaluation for cardiac arrest and CPR for 20 min, concern for acute hypoxic encephalopathy  EXAM: CT HEAD WITHOUT CONTRAST  TECHNIQUE: Contiguous axial images were obtained from the base of the skull through the vertex without intravenous contrast.  COMPARISON:   None.  FINDINGS: Endotracheal tube and orogastric tube noted.  Moderate diffuse atrophy. No abnormal attenuation to suggest acute vascular territory infarct. No hemorrhage or extra-axial fluid. No hydrocephalus.  IMPRESSION: Currently no evidence of acute abnormality although findings of anoxic injury may require up to 12 hr to show CT manifestation. Consider MRI or follow-up CT scan in approximately 12 hr to re-evaluate.   Electronically Signed   By: Skipper Cliche M.D.   On: 09/15/2014 21:27   Assessment/Plan: 74 year old lady with probable diffuse anoxic brain injury secondary to reactive arrest on 09/12/2013. She was having myoclonic-like activity as well his remaining unresponsive to this point. EEG showed no clear epileptic activity. Prognosis is guarded at this point.  Plan: 1. Trial of Depacon 1000 mg loading dose followed by milligrams every  12 hours. Keppra will be held for now. 2. Depakote level in the a.m. 3. Repeat CT scan of the head to rule out ischemic changes as well as cerebral edema.  We will continue to follow this patient closely with you.  C.R. Nicole Kindred, MD [facility] [phone]  09/14/2014, 4:38 PM

## 2014-09-14 NOTE — Progress Notes (Signed)
Called about frequent ventricular ectopy, NSVT - recent history of VF arrest, unknown etiology. LV function appears preserved and repeat cath shows widely patent stents. Patient has completed artic sun protocol and has rewarmed. She has been having seizure activity today. Recommend starting amiodarone 150 mg x 1 now, then start amiodarone infusion.  Chrystie NoseKenneth C. Winna Golla, MD, [facility] Center[facility]ACC Attending Cardiologist [facility]

## 2014-09-14 NOTE — Care Management Note (Signed)
    Page 1 of 1   09/14/2014     8:39:49 AM CARE MANAGEMENT NOTE 09/14/2014  Patient:  [name],[name]   Account Number:  [id number]  Date Initiated:  09/14/2014  Documentation initiated by:  Junius CreamerWELL,DEBBIE  Subjective/Objective Assessment:   adm w cardiac arrest-vent     Action/Plan:   lives w husband, pcp dr Herold Harmshanna kim, dc home w [facility]   Anticipated DC Date:     Anticipated DC Plan:  HOME W HOME HEALTH SERVICES         [facility]  Resumption Of Svcs/PTA Provider   Choice offered to / List presented to:          [facility]  [facility] RN  HH-10 DISEASE MANAGEMENT  HH-2 PT      HH agency  [facility]   Status of service:   Medicare Important Message given?   (If response is "NO", the following Medicare IM given date fields will be blank) Date Medicare IM given:   Medicare IM given by:   Date Additional Medicare IM given:   Additional Medicare IM given by:    Discharge Disposition:    Per UR Regulation:  Reviewed for med. necessity/level of care/duration of stay  If discussed at Long Length of Stay Meetings, dates discussed:    Comments:

## 2014-09-14 NOTE — Progress Notes (Signed)
Echocardiogram 2D Echocardiogram has been performed.  [name], [name] M 09/14/2014, 10:10 AM

## 2014-09-14 NOTE — Telephone Encounter (Signed)
Patient's daughter Lanora Manislizabeth called back today to go over the FMLA forms she bought in to be completed and she stated the forms are no longer needed as the pt had another MI, went into cardiac arrest and they are waiting on her to wake up.  I apologized on behalf of Dr Selena BattenKim and I and asked she call back if needed.

## 2014-09-14 NOTE — Progress Notes (Signed)
EEG Completed; Results Pending  

## 2014-09-14 NOTE — Progress Notes (Signed)
Patient Name: [name] Date of Encounter: 09/14/2014     Active Problems:   Ventricular fibrillation   Cardiac arrest   Coronary artery disease involving native coronary artery of native heart without angina pectoris   Acute myocardial infarction of inferolateral wall, subsequent episode of care   Respiratory failure    SUBJECTIVE  The patient remains intubated.  Remains sedated.  Blood pressure remains low.  Fluid bolus has been ordered.  Rewarming has been started. Telemetry shows normal sinus rhythm with bigeminy PVCs.  CURRENT MEDS . sodium chloride  2,000 mL Intravenous Once  . antiseptic oral rinse  7 mL Mouth Rinse QID  . artificial tears  1 application Both Eyes 3 times per day  . aspirin  81 mg Per Tube Daily  . chlorhexidine  15 mL Mouth Rinse BID  . cisatracurium  0.1 mg/kg Intravenous Once  . clopidogrel  75 mg Per Tube Daily  . fentaNYL  50 mcg Intravenous Once  . insulin aspart  2-6 Units Subcutaneous 6 times per day  . levofloxacin (LEVAQUIN) IV  750 mg Intravenous Q48H  . levothyroxine  50 mcg Per Tube QAC breakfast  . metoprolol tartrate  12.5 mg Per Tube BID  . midazolam  1 mg Intravenous Once  . pantoprazole (PROTONIX) IV  40 mg Intravenous QHS  . pravastatin  80 mg Per Tube q1800  . sodium chloride  3 mL Intravenous Q12H    OBJECTIVE  Filed Vitals:   09/14/14 0650 09/14/14 0700 09/14/14 0710 09/14/14 0739  BP:    78/37  Pulse: 38 39 41 97  Temp:  97.7 F (36.5 C)    TempSrc:  Core (Comment)    Resp: 18 18 18 18   Height:      Weight:      SpO2: 100% 100% 100% 100%    Intake/Output Summary (Last 24 hours) at 09/14/14 0742 Last data filed at 09/14/14 0600  Gross per 24 hour  Intake 1365.72 ml  Output    410 ml  Net 955.72 ml   Filed Weights   09/21/2014 1750 09/13/14 0500 09/14/14 0500  Weight: 130 lb 8.2 oz (59.2 kg) 123 lb 7.3 oz (56 kg) 131 lb 7.4 oz (59.63 kg)    PHYSICAL EXAM  General: Intubated and sedated Neuro: Not  testable Psych: Sedated on vent HEENT:  Normal  Neck: Supple without bruits or JVD. Lungs:  Resp regular and unlabored, lungs clear anteriorly Heart: RRR no s3, s4, or murmurs. Abdomen: Soft, non-tender, non-distended, soft bowel sounds.  Extremities: Cool, vasoconstricted  Accessory Clinical Findings  CBC  Recent Labs  10/01/2014 1930  09/13/14 0530 09/14/14 0500  WBC 24.4*  --  18.8* 11.8*  NEUTROABS 21.5*  --   --  9.4*  HGB 8.9*  < > 9.0* 8.2*  HCT 26.8*  < > 26.4* 24.0*  MCV 88.2  --  84.6 83.0  PLT 347  --  321 319  < > = values in this interval not displayed. Basic Metabolic Panel  Recent Labs  09/30/2014 1543  09/28/2014 1930  09/14/14 0213 09/14/14 0500  NA 138  < > 136  < > 137 133*  K 5.1  < > 4.0  < > 3.8 3.5  CL 111  < > 113*  < > 113* 109  CO2 12*  --  17*  < > 18* 17*  GLUCOSE 311*  < > 301*  < > 91 70  BUN 9  < >  12  < > 17 17  CREATININE 0.98  < > 0.86  < > 0.94 0.98  CALCIUM 8.3*  --  6.6*  < > 7.8* 7.6*  MG 2.2  --  1.6  --   --   --   PHOS 7.0*  --   --   --   --   --   < > = values in this interval not displayed. Liver Function Tests  Recent Labs  09/11/2014 1543 09/09/2014 1930  AST 110* 179*  ALT 83* 143*  ALKPHOS 89 104  BILITOT 0.5 0.8  PROT 4.2* 4.1*  ALBUMIN 2.3* 2.0*   No results for input(s): LIPASE, AMYLASE in the last 72 hours. Cardiac Enzymes  Recent Labs  09/29/2014 2355 09/13/14 0530 09/13/14 1155  TROPONINI 2.49* 2.42* 2.13*   BNP Invalid input(s): POCBNP D-Dimer No results for input(s): DDIMER in the last 72 hours. Hemoglobin A1C No results for input(s): HGBA1C in the last 72 hours. Fasting Lipid Panel No results for input(s): CHOL, HDL, LDLCALC, TRIG, CHOLHDL, LDLDIRECT in the last 72 hours. Thyroid Function Tests  Recent Labs  09/17/2014 1930  TSH 4.093    TELE  Normal sinus rhythm with bigeminy PVCs  ECG   Critical Test Result: STEMI Sinus rhythm with frequent Premature ventricular complexes in a  pattern of bigeminy Inferior infarct , possibly acute Marked T wave abnormality, consider anterolateral ischemia  ACUTE MI / STEMI  Consider right ventricular involvement in acute inferior infarct Abnormal ECG Radiology/Studies  Dg Chest Port 1 View  09/13/2014   CLINICAL DATA:  Respiratory failure  EXAM: PORTABLE CHEST - 1 VIEW  COMPARISON:  September 12, 2014  FINDINGS: Endotracheal tube tip is 5.4 cm above the carina. Nasogastric tube tip and side port are in the stomach. Central catheter tip is in the superior vena cava. No pneumothorax. There is underlying emphysema. There is significantly less interstitial edema compared to 1 day prior. There is no consolidation or volume loss. The heart is upper normal in size with pulmonary vascularity within normal limits. No adenopathy.  IMPRESSION: Considerably less interstitial edema compared to 1 day prior. No new opacity. Underlying emphysema. No change in cardiac silhouette. Tube and catheter positions as described without pneumothorax.   Electronically Signed   By: Bretta Bang M.D.   On: 09/13/2014 15:19   Dg Chest Port 1 View  09/18/2014   CLINICAL DATA:  Central line evaluation.  Intubated patient.  EXAM: PORTABLE CHEST - 1 VIEW  COMPARISON:  Radiograph 09/08/2014  FINDINGS: Endotracheal tube and NG tube are unchanged. Interval placement of a right central venous line with tip in the distal SVC. No pneumothorax.  Stable cardiac silhouette. There is diffuse bilateral airspace disease more dense on the right. No pleural fluid.  IMPRESSION: 1. Interval placement right central venous line without complication. 2. Diffuse airspace disease suggesting pulmonary edema, not changed from prior. 3. Other support apparatus stable.   Electronically Signed   By: Genevive Bi M.D.   On: 09/11/2014 19:31   Dg Chest Portable 1 View  09/13/2014   CLINICAL DATA:  Patient status post CPR.  History of COPD.  EXAM: PORTABLE CHEST - 1 VIEW  COMPARISON:  09/08/2014   FINDINGS: ET tube terminates in the mid trachea. Enteric tube courses inferior to the diaphragm. Patient is rotated. Tortuosity of the thoracic aorta. Stable cardiac contours. Diffuse bilateral heterogeneous pulmonary opacities. Multiple irregular lucencies projecting over the left upper and lower hemi thorax.  IMPRESSION: Multiple  lucencies projecting over the left hemithorax are nonspecific on portable chest radiograph. While these may be secondary to skin folds, pneumothorax is not excluded. Recommend attention on repeat radiograph.  Diffuse bilateral heterogeneous pulmonary opacities, favored to represent edema.  These results will be called to the ordering clinician or representative by the Radiologist Assistant, and communication documented in the PACS or zVision Dashboard.   Electronically Signed   By: Annia Belt M.D.   On: 09/30/2014 16:53   Dg Chest Port 1 View  09/08/2014   CLINICAL DATA:  Temporary pacer.  EXAM: PORTABLE CHEST - 1 VIEW  COMPARISON:  09/07/2014.  FINDINGS: Mediastinum hilar structures are normal. Tapering pacer noted projected over right ventricle. Stable cardiomegaly. Pulmonary vascularity normal. Low lung volumes with mild basilar atelectasis. No pleural effusion or pneumothorax.  IMPRESSION: 1. Temporary pacer noted in stable position projected over the right ventricle. Stable cardiomegaly. No CHF. 2. Low lung volumes with mild basilar atelectasis .   Electronically Signed   By: Maisie Fus  Register   On: 09/08/2014 07:28   Dg Chest Port 1 View  09/07/2014   CLINICAL DATA:  Left posterior chest discomfort. Temporary pacemaker.  EXAM: PORTABLE CHEST - 1 VIEW  COMPARISON:  09/05/2014  FINDINGS: Temporary pacing lead projects in the right ventricle, unchanged.  Cardiac silhouette is mildly enlarged. No mediastinal or hilar masses or evidence of adenopathy.  Clear lungs.  No pleural effusion or pneumothorax.  Bony thorax is intact.  IMPRESSION: 1. No acute cardiopulmonary disease. No  change from the prior study.   Electronically Signed   By: Amie Portland M.D.   On: 09/07/2014 09:40   Dg Chest Portable 1 View  09/05/2014   CLINICAL DATA:  Acute myocardial infarction.  EXAM: PORTABLE CHEST - 1 VIEW  COMPARISON:  04/19/2013  FINDINGS: Heart size and pulmonary vascularity are within normal limits in the lungs are clear. No acute osseous abnormality. Slight tortuosity and calcification of the thoracic aorta. Temporary pacing lead in place.  IMPRESSION: Temporary pacing lead in place.  No acute abnormalities.   Electronically Signed   By: Geanie Cooley M.D.   On: 09/05/2014 19:01   Ct Portable Head W/o Cm  09/05/2014   CLINICAL DATA:  Initial evaluation for cardiac arrest and CPR for 20 min, concern for acute hypoxic encephalopathy  EXAM: CT HEAD WITHOUT CONTRAST  TECHNIQUE: Contiguous axial images were obtained from the base of the skull through the vertex without intravenous contrast.  COMPARISON:  None.  FINDINGS: Endotracheal tube and orogastric tube noted.  Moderate diffuse atrophy. No abnormal attenuation to suggest acute vascular territory infarct. No hemorrhage or extra-axial fluid. No hydrocephalus.  IMPRESSION: Currently no evidence of acute abnormality although findings of anoxic injury may require up to 12 hr to show CT manifestation. Consider MRI or follow-up CT scan in approximately 12 hr to re-evaluate.   Electronically Signed   By: Esperanza Heir M.D.   On: 09/04/2014 21:27    ASSESSMENT AND PLAN  1.  Ischemic heart disease with recent acute inferior wall myocardial infarction treated with drug-eluting stent to the right coronary artery.  She has known residual disease in the proximal LAD and proximal AV circumflex.  She returned after out of hospital cardiac arrest at home on 09/04/2014.  Repeat cardiac catheterization on 09/29/2014 shows: 1. Severe stenosis of the LAD and left circumflex without significant change from the previous study 2. Widely patent RCA with no significant  stenosis of the recently placed stent 3. Mild  LV systolic dysfunction with inferior wall akinesis and an estimated LVEF of 45-50%. There was no evidence of stent thrombosis.  This may have been a VF arrest related to her previous inferior wall infarction with scar.  2.  Hypotension 3.  Arctic sun Protocol protocol following out of hospital cardiac VF arrest.  Down time was estimated at 20 minutes prior to ROSC. 4.  Frequent PVCs  Plan: Continue rewarming.  Support blood pressure with IV fluid bolus as per critical care medicine.  Depending on mental status following rewarming, consider ICD for secondary prevention.  Signed, Cassell Clement MD

## 2014-09-14 NOTE — Progress Notes (Signed)
Fentanyl gtt waste in sink  15cc from 250cc  bag. Versed 25cc waste in sink from a 50cc bag. Witnessed with second Charity fundraiserN.  Dawson BillsKim Tricia Pledger, RN

## 2014-09-14 NOTE — Progress Notes (Signed)
eLink Physician-Brief Progress Note Patient Name: [name] DOB: [birth year] MRN: [id number]   Date of Service  09/14/2014  HPI/Events of Note  Low BP, rewarm, cvp 6  eICU Interventions  vbolus     Intervention Category Major Interventions: Hypotension - evaluation and management  Nelda BucksFEINSTEIN,DANIEL J. 09/14/2014, 6:54 AM

## 2014-09-14 NOTE — Progress Notes (Signed)
PULMONARY / CRITICAL CARE MEDICINE   Name: [name] MRN: [id number] DOB: Jun 19, 1941    ADMISSION DATE:  09/18/2014 CONSULTATION DATE:  2014/09/18   REFERRING MD :  Dr. Excell Seltzer   CHIEF COMPLAINT:  Cardiac arrest   INITIAL PRESENTATION:  74 year old female w/ sig h/o ETOH, CAF and recent MI. Discharged 1/8 s/p PCI w/ DES to RCA and triple drug therapy. Returned to ER s/p VF arrest on 1/9. Down time estimated 20 minutes to ROSC. STAT to cath lab: no sig change from recent cath. PCCM asked to see for hypothermia.   STUDIES:  09/07/14 echo EF 55-60% mod HK and basalinferior myocardium. High ventricular filling pressure, mild MR, PAP 33 Left heart cath 1/9: 1. Severe stenosis of the LAD and left circumflex without significant change from the previous study 2. Widely patent RCA with no significant stenosis of the recently placed stent 3. Mild LV systolic dysfunction with inferior wall akinesis and an estimated LVEF of 45-50%.  SIGNIFICANT EVENTS: 1/9 admit, hypothermia protocol  1/9 CT head Currently no evidence of acute abnormality although findings of anoxic injury may require up to 12 hr to show CT manifestation. Consider MRI or follow-up CT scan in approximately 12 hr to re-evaluate. 1/11 EEG abnl cant r/o ictal source   SUBJECTIVE:  Unable to obtain due to mental status    RN not at MAP goal >80 MAP has been 67 getting 750 bolus. This started ~6:30 am.  Rewarming finish this am.  cbgs 61-71 given D50 x 1.    VITAL SIGNS: Temp:  [90.9 F (32.7 C)-96.8 F (36 C)] 96.8 F (36 C) (01/11 0604) Pulse Rate:  [28-129] 41 (01/11 0710) Resp:  [17-20] 18 (01/11 0710) BP: (135)/(95) 135/95 mmHg (01/10 1515) SpO2:  [73 %-100 %] 100 % (01/11 0710) Arterial Line BP: (77-183)/(40-103) 118/60 mmHg (01/11 0710) FiO2 (%):  [30 %-40 %] 30 % (01/11 0316) Weight:  [131 lb 7.4 oz (59.63 kg)] 131 lb 7.4 oz (59.63 kg) (01/11 0500) HEMODYNAMICS: CVP:  [4 mmHg-7 mmHg] 6 mmHg VENTILATOR SETTINGS: Vent  Mode:  [-] PRVC FiO2 (%):  [30 %-40 %] 30 % Set Rate:  [18 bmp-20 bmp] 18 bmp Vt Set:  [490 mL] 490 mL PEEP:  [5 cmH20] 5 cmH20 Plateau Pressure:  [18 cmH20-24 cmH20] 21 cmH20 INTAKE / OUTPUT:  Intake/Output Summary (Last 24 hours) at 09/14/14 0723 Last data filed at 09/14/14 0600  Gross per 24 hour  Intake 1365.72 ml  Output    410 ml  Net 955.72 ml    PHYSICAL EXAMINATION: General: Unresponsive s/p arrest. Lying in bed Neuro: sedated  HEENT: Orally intubated. + JVD Cardiovascular: bradycardic Lungs: Diffuse rhonchi b/l Abdomen: Soft, non-tender + bowel sounds that are hypoactive  Musculoskeletal: Intact  Skin: cool extremities upper and lower Ext: trace lower ext edema  LABS:  CBC  Recent Labs Lab 09/18/2014 1930  2014/09/18 2157 09/13/14 0530 09/14/14 0500  WBC 24.4*  --   --  18.8* 11.8*  HGB 8.9*  < > 10.5* 9.0* 8.2*  HCT 26.8*  < > 31.0* 26.4* 24.0*  PLT 347  --   --  321 319  < > = values in this interval not displayed. Coag's  Recent Labs Lab September 18, 2014 1543  2014/09/18 1930 09/13/14 0315 09/13/14 1155 09/13/14 1935  APTT  --   < > 66* 135* 193* 67*  INR 1.39  --  1.50* 1.38  --   --   < > =  values in this interval not displayed. BMET  Recent Labs Lab 09/13/14 2155 09/14/14 0213 09/14/14 0500  NA 139 137 133*  K 4.3 3.8 3.5  CL 112 113* 109  CO2 15* 18* 17*  BUN CREATININE 0.95 0.94 0.98  GLUCOSE 70 91 70   Electrolytes  Recent Labs Lab 09/20/2014 1543 09/05/2014 1930  09/13/14 2155 09/14/14 0213 09/14/14 0500  CALCIUM 8.3* 6.6*  < > 7.9* 7.8* 7.6*  MG 2.2 1.6  --   --   --   --   PHOS 7.0*  --   --   --   --   --   < > = values in this interval not displayed. Sepsis Markers  Recent Labs Lab 09/06/2014 1619  LATICACIDVEN 9.53*   ABG  Recent Labs Lab 09/13/2014 1555 09/30/2014 2320 09/13/14 1515  PHART 7.134* 7.370 7.470*  PCO2ART 35.5 29.6* 24.8*  PO2ART 51.0* 165.0* 145.0*   Liver Enzymes  Recent  Labs Lab 09/28/2014 1543 09/07/2014 1930  AST 110* 179*  ALT 83* 143*  ALKPHOS 89 104  BILITOT 0.5 0.8  ALBUMIN 2.3* 2.0*   Cardiac Enzymes  Recent Labs Lab 09/29/2014 2355 09/13/14 0530 09/13/14 1155  TROPONINI 2.49* 2.42* 2.13*   Glucose  Recent Labs Lab 09/13/14 1546 09/13/14 1744 09/13/14 1934 09/13/14 2333 09/14/14 0009 09/14/14 0447  GLUCAP 93 86 80 61* 121* 71    Imaging Dg Chest Port 1 View  09/13/2014   CLINICAL DATA:  Respiratory failure  EXAM: PORTABLE CHEST - 1 VIEW  COMPARISON:  September 12, 2014  FINDINGS: Endotracheal tube tip is 5.4 cm above the carina. Nasogastric tube tip and side port are in the stomach. Central catheter tip is in the superior vena cava. No pneumothorax. There is underlying emphysema. There is significantly less interstitial edema compared to 1 day prior. There is no consolidation or volume loss. The heart is upper normal in size with pulmonary vascularity within normal limits. No adenopathy.  IMPRESSION: Considerably less interstitial edema compared to 1 day prior. No new opacity. Underlying emphysema. No change in cardiac silhouette. Tube and catheter positions as described without pneumothorax.   Electronically Signed   By: Bretta Bang M.D.   On: 09/13/2014 15:19     ASSESSMENT / PLAN:  PULMONARY OETT 1/9>> A:  VDRF s/p cardiopulmonary arrest  Diffuse b/l infiltrates ? pna vs aspiration Pulmonary edema  H/o COPD  P:   ABG, d/c levaquin today  Continue full vent support  Duoneb q6 prn  CARDIOVASCULAR CVL right IJ 1/9, left radial A line, PIV x 1  A:  VF/cardiac arrest  CAD s/p recent stent to RCA, with sig disease LAD H/o paroxysmal AF  Bradycardia  Elevated trop down trending 3.62>>2.13  Long QT on EKG 1/11  MAP goal >65 today  P:  Heparin gtt  Cards following considering secondary prevention ICD Aspirin 81, Plavix 75, Pravachol 80, Lopressor 12.5 mg bid Pending repeat echo  RENAL A:   Oliguria  Non anion  gap metabolic acidosis   P:   Strict i/o, daily weights Defer further lasix 1/12  GASTROINTESTINAL A:   GI px  NPO with NG/OGT P:   Protonix 40 mg qd  TF start today   HEMATOLOGIC A:   DVT px Normocytic anemia hbg 8.2 (down from BL 12-14) Elevated PTT  P:  Hep gtt  Will check fobt  Transfusion goal <8  INFECTIOUS A:   Leukocytosis down trending  Suspected pna  vs aspiration  P:   BCx2 not obtained  UC insig growth Sputum pending MRSA neg Abx: Levaquin q48 1/9 >1/11  Monitor CBC. Restart Abx if clinically indicated   ENDOCRINE A:   Hyperglycemia resolved with h/o DM now with hypoglycemia  H/o hypothyroidism  P:   Monitor cbgs q4  Lantus 10 units qd will d/c today, continue SSI (2-6 units) q4  Synthyroid 50 mcg qd per tube  NEUROLOGIC A:   Sedated  Encephalopathy  Twitching with rewarm  P:   RASS goal: -0 to -1 currently -4 Nimbex off , Fentany gtt @300  and 25 q30 min prn, Versed @3   Pending EEG, consider repeat CT or MRI to reeval. Brain for anoxia if suspected  Consider Neuro consult if MS not improving   FAMILY  - Updates: will update family if not at beside - Inter-disciplinary family meet or Palliative Care meeting due by:  09/10/2014     TODAY'S SUMMARY:  Continue full vent support.  CAD s/p VT/VF arrest. Hypothermia initiated now rewarming complete this am. Pressors up due to MAP being low receiving bolus. Follow MS.     09/14/2014, 7:23 AM Desma Maximracy McLean MD [phone]   Reviewed above, examined.  She is being rewarmed.  No evidence for infection >> will d/c Abx.  Will need to assess neuro status once rewarmed and off sedation/paralytics.    CC time by me independent of resident time 35 minutes.  Coralyn HellingVineet Allesha Aronoff, MD [facility] 09/14/2014, 2:06 PM Pager:  [phone] After 3pm call: [phone]

## 2014-09-14 NOTE — Progress Notes (Signed)
Notified Elink MD of pt cbg of 71. No new orders at this time will continue to monitor.

## 2014-09-14 NOTE — Procedures (Signed)
ELECTROENCEPHALOGRAM REPORT  Patient: [name] Tessler       Room #: 2H-04 EEG No. ID: [id number] Age: 74 y.o.        Sex: female Referring Physician: Dr Excell Seltzerooper Report Date:  09/14/2014        Interpreting Physician: Elspeth ChoSUMNER, Aryaa Bunting, JUSTIN  History: [name] Gluth is an 74 y.o. female s/p VF arrest, down around 20 minutes. Hypothermia protocol now rewarmed.   Medications:  Scheduled: . sodium chloride  2,000 mL Intravenous Once  . antiseptic oral rinse  7 mL Mouth Rinse QID  . artificial tears  1 application Both Eyes 3 times per day  . aspirin  81 mg Per Tube Daily  . chlorhexidine  15 mL Mouth Rinse BID  . cisatracurium  0.1 mg/kg Intravenous Once  . clopidogrel  75 mg Per Tube Daily  . feeding supplement (VITAL HIGH PROTEIN)  1,000 mL Per Tube Q24H  . fentaNYL  50 mcg Intravenous Once  . insulin aspart  2-6 Units Subcutaneous 6 times per day  . levothyroxine  50 mcg Per Tube QAC breakfast  . metoprolol tartrate  12.5 mg Per Tube BID  . midazolam  1 mg Intravenous Once  . pantoprazole (PROTONIX) IV  40 mg Intravenous QHS  . pravastatin  80 mg Per Tube q1800  . sodium chloride  3 mL Intravenous Q12H    Conditions of Recording:  This is a 16 channel EEG carried out with the patient in the unresponsive state.  Description:  The study is technically limited due to diffuse muscle artifact. There is diffuse generalized slowing in the theta to delta range. No posterior dominant rhythm is noted. There are frequent intermittent questionable spikes most notable at C4. These likely represent muscle artifact but an underlying ictal process cannot be ruled out. There is no clear evolution of these spikes. No underlying epileptiform activity noted.  No definite sleep state is obtained. Hyperventilation and photic stimulation were not performed. Noxious stimuli failed to elicit a response.    IMPRESSION: Abnormal EEG due to diffuse slowing indicating a moderate cerebral dysfunction  (encephalopathy). This study was technically limited due to an excessive amount of artifact. There were notable spikes (most prominent at C4) which likely represent muscle artifact but an ictal source cannot be ruled out.    Elspeth Choeter Melesio Madara, DO [facility] [phone]  If 7pm- 7am, please page neurology on call as listed in AMION. 09/14/2014, 12:03 PM

## 2014-09-14 NOTE — Progress Notes (Signed)
INITIAL NUTRITION ASSESSMENT  DOCUMENTATION CODES Per approved criteria  -Not Applicable   INTERVENTION: Initiate Vital AF 1.2 at 7025ml/hr and increase by 10ml every 4 hours to goal rate of 8245ml/hr.   Provides 1296 kcals (104% of needs), 81g Protein, 875ml H20  NUTRITION DIAGNOSIS: Inadequate oral intake related to unable to eat as evidenced by NPO status.   Goal: Meet at least 90% of estimated needs  Monitor: Tolerance of TF; weight   Reason for Assessment: Consult for enteral/tube feeding initiation and managent  74 y.o. female  Admitting Dx: Cardiac arrest  ASSESSMENT: Pt admitted with cardiac arrest. Pt with history of alcohol abuse, smoking and recent MI. Pt has been rewarmed, and currently on normothermia protocol. Per RN pt just started having seizures, neurology following.  Pt currently intubated on ventilator support. Minute Ventilation number was 9.1, temperature 37.3, and pt has OG tube in place. VHP started at 10ml per protocol. Discussed TF plan with RN.  Nutrition Focused Physical Exam:  Subcutaneous Fat:  Orbital Region: WDL Upper Arm Region: WDL Thoracic and Lumbar Region: Unable to assess  Muscle:  Temple Region: mild depletion Clavicle Bone Region: mild depletion Clavicle and Acromion Bone Region: mild depletion Scapular Bone Region: Unable to assess Dorsal Hand: Edematous  Patellar Region: WDL Anterior Thigh Region: Unable to assess Posterior Calf Region: WDL  Edema: Present   Height: Ht Readings from Last 1 Encounters:  05-11-15 5\' 2"  (1.575 m)    Weight: Wt Readings from Last 1 Encounters:  09/14/14 131 lb 7.4 oz (59.63 kg)  Admission Weight 119 lbs   Ideal Body Weight: 110 lbs  % Ideal Body Weight: 109%  Wt Readings from Last 10 Encounters:  09/14/14 131 lb 7.4 oz (59.63 kg)  09/11/14 119 lb 8 oz (54.205 kg)  08/18/14 112 lb 12.8 oz (51.166 kg)  06/16/14 113 lb 12.8 oz (51.619 kg)  06/08/14 113 lb 8 oz (51.483 kg)  04/29/14 112  lb 8 oz (51.03 kg)  02/01/14 115 lb 8 oz (52.39 kg)  01/23/14 118 lb (53.524 kg)  11/18/13 122 lb (55.339 kg)  11/04/13 119 lb (53.978 kg)    Usual Body Weight: 125-130  % Usual Body Weight: 95% of 125  BMI:  Body mass index is 24.04 kg/(m^2).  Estimated Nutritional Needs: Kcal: 1250 Protein: 65-81 Fluid: 1.5 L  Skin: intact  Diet Order: Diet NPO time specified  EDUCATION NEEDS: No needs at this time   Intake/Output Summary (Last 24 hours) at 09/14/14 1454 Last data filed at 09/14/14 1148  Gross per 24 hour  Intake 1084.36 ml  Output    240 ml  Net 844.36 ml    Last BM: PTA  Labs:   Recent Labs Lab 05-11-15 1543  05-11-15 1930  09/13/14 2155 09/14/14 0213 09/14/14 0500  NA 138  < > 136  < > 139 137 133*  K 5.1  < > 4.0  < > 4.3 3.8 3.5  CL 111  < > 113*  < > 112 113* 109  CO2 12*  --  17*  < > 15* 18* 17*  BUN 9  < > 12  < > 17 17 17   CREATININE 0.98  < > 0.86  < > 0.95 0.94 0.98  CALCIUM 8.3*  --  6.6*  < > 7.9* 7.8* 7.6*  MG 2.2  --  1.6  --   --   --  1.5  PHOS 7.0*  --   --   --   --   --   --  GLUCOSE 311*  < > 301*  < > 70 91 70  < > = values in this interval not displayed.  CBG (last 3)   Recent Labs  09/14/14 0447 09/14/14 0750 09/14/14 1136  GLUCAP 71 92 80    Scheduled Meds: . sodium chloride  2,000 mL Intravenous Once  . antiseptic oral rinse  7 mL Mouth Rinse QID  . artificial tears  1 application Both Eyes 3 times per day  . aspirin  81 mg Per Tube Daily  . chlorhexidine  15 mL Mouth Rinse BID  . cisatracurium  0.1 mg/kg Intravenous Once  . clopidogrel  75 mg Per Tube Daily  . feeding supplement (VITAL HIGH PROTEIN)  1,000 mL Per Tube Q24H  . fentaNYL  50 mcg Intravenous Once  . insulin aspart  2-6 Units Subcutaneous 6 times per day  . levETIRAcetam  500 mg Intravenous Q12H  . levothyroxine  50 mcg Per Tube QAC breakfast  . metoprolol tartrate  12.5 mg Per Tube BID  . midazolam  1 mg Intravenous Once  . pantoprazole  (PROTONIX) IV  40 mg Intravenous QHS  . pravastatin  80 mg Per Tube q1800  . sodium chloride  3 mL Intravenous Q12H    Continuous Infusions: . cisatracurium (NIMBEX) infusion Stopped (09/14/14 0604)  . fentaNYL infusion INTRAVENOUS Stopped (09/14/14 0804)  . heparin 350 Units/hr (09/14/14 0800)  . midazolam (VERSED) infusion Stopped (09/14/14 0804)  . norepinephrine (LEVOPHED) Adult infusion 24 mcg/min (09/14/14 1148)    Past Medical History  Diagnosis Date  . PAF (paroxysmal atrial fibrillation)   . COPD (chronic obstructive pulmonary disease)   . Hyperlipidemia   . Hypothyroidism   . Arthritis   . Diabetes mellitus   . History of EMG 11/00    negative  . Ventricular fibrillation 2014-09-30  . Cardiac arrest 2014/09/30    Past Surgical History  Procedure Laterality Date  . Ankle fracture surgery      left  . Thyroid colloid cyst   12/00  . Cardiac catheterization  06/16/05    [place] 20%, LAD 20/40%, D1 & D2 30%, CFX 30%, RCA 50%, EF 75%   . Left heart catheterization with coronary angiogram N/A 09/05/2014    Procedure: LEFT HEART CATHETERIZATION WITH CORONARY ANGIOGRAM;  Surgeon: Lennette Bihari, MD;  Location: [facility] CATH LAB;  Service: Cardiovascular;  Laterality: N/A;  . Left heart cath Bilateral 09-30-2014    Procedure: LEFT HEART CATH;  Surgeon: Micheline Chapman, MD;  Location: [facility] CATH LAB;  Service: Cardiovascular;  Laterality: Bilateral;    Cristela Felt, MS Dietetic Intern Pager: [phone]

## 2014-09-14 NOTE — Progress Notes (Addendum)
ANTICOAGULATION CONSULT NOTE  Pharmacy Consult for Heparin Indication:  H/o Afib  Allergies  Allergen Reactions  . Atorvastatin Other (See Comments)    Muscle aches  . Hydrochlorothiazide W-Triamterene Other (See Comments)     palpitations  . Penicillins Hives  . Statins Other (See Comments)    Subjective myalgia   Patient Measurements: Height: 5\' 2"  (157.5 cm) Weight: 131 lb 7.4 oz (59.63 kg) IBW/kg (Calculated) : 50.1 Heparin Dosing Weight: 54  Vital Signs: Temp: 97.7 F (36.5 C) (01/11 0700) Temp Source: Core (Comment) (01/11 0700) BP: 78/37 mmHg (01/11 0739) Pulse Rate: 97 (01/11 0739)  Labs:  Recent Labs  10/02/2014 1543  10/04/2014 1930  09/18/2014 2157 09/28/2014 2355  09/13/14 0315  09/13/14 0530  09/13/14 1155 09/13/14 1200  09/13/14 1935 09/13/14 1955 09/13/14 2155 09/14/14 0213 09/14/14 0500  HGB 8.9*  < > 8.9*  < > 10.5*  --   --   --   --  9.0*  --   --   --   --   --   --   --   --  8.2*  HCT 26.7*  < > 26.8*  < > 31.0*  --   --   --   --  26.4*  --   --   --   --   --   --   --   --  24.0*  PLT 294  --  347  --   --   --   --   --   --  321  --   --   --   --   --   --   --   --  319  APTT  --   < > 66*  --   --   --   --  135*  --   --   --  193*  --   --  67*  --   --   --   --   LABPROT 17.2*  --  18.2*  --   --   --   --  17.1*  --   --   --   --   --   --   --   --   --   --   --   INR 1.39  --  1.50*  --   --   --   --  1.38  --   --   --   --   --   --   --   --   --   --   --   HEPARINUNFRC  --   --   --   --   --   --   --   --   < > 0.92*  --   --  0.94*  --   --  0.56  --   --  0.40  CREATININE 0.98  < > 0.86  < > 0.80 0.83  < >  --   --  0.87  < >  --   --   < >  --   --  0.95 0.94 0.98  TROPONINI  --   --  3.62*  --   --  2.49*  --   --   --  2.42*  --  2.13*  --   --   --   --   --   --   --   < > = values in  this interval not displayed. Estimated Creatinine Clearance: 39.2 mL/min (by C-G formula based on Cr of 0.98).  Assessment: 74 yo  female admitted with cardiac arrest s/p cath, h/o Afib Eliquis on hold, s/p hypothermia protocol (rewarming completed at about 9am today( . Continuing on heparin IV for STEMI/afib history. Heparin level is at goal (HL= 0.4) and hg= 8.2, plt= 319.  Goal of Therapy:  Heparin level 0.3-0.7 units/ml Monitor platelets by anticoagulation protocol: Yes     Plan:  -Continue heparin rate at 350 units/hr - Recheck heparin level today with rewarming -Daily heparin level and CBC -Will follow for restart of oral anticoagulation  Harland German, Pharm D 09/14/2014 8:00 AM   Addendum -Heparin level= < 0.1 (Possible lab error or may be an effect of rewarming) -Has been at goal x2 on 350 units/hr  Plan -Increase heparin to 500 units/hr -Recheck a heparin level in 8 hrs  Harland German, Pharm D 09/14/2014 2:57 PM

## 2014-09-15 ENCOUNTER — Inpatient Hospital Stay (HOSPITAL_COMMUNITY): Payer: Medicare Other

## 2014-09-15 ENCOUNTER — Encounter: Payer: Self-pay | Admitting: Gastroenterology

## 2014-09-15 DIAGNOSIS — G931 Anoxic brain damage, not elsewhere classified: Secondary | ICD-10-CM

## 2014-09-15 DIAGNOSIS — G253 Myoclonus: Secondary | ICD-10-CM

## 2014-09-15 LAB — URINALYSIS, ROUTINE W REFLEX MICROSCOPIC
BILIRUBIN URINE: NEGATIVE
Glucose, UA: NEGATIVE mg/dL
KETONES UR: 15 mg/dL — AB
Nitrite: NEGATIVE
PH: 5 (ref 5.0–8.0)
Protein, ur: NEGATIVE mg/dL
Specific Gravity, Urine: 1.028 (ref 1.005–1.030)
Urobilinogen, UA: 1 mg/dL (ref 0.0–1.0)

## 2014-09-15 LAB — GLUCOSE, CAPILLARY
GLUCOSE-CAPILLARY: 164 mg/dL — AB (ref 70–99)
Glucose-Capillary: 113 mg/dL — ABNORMAL HIGH (ref 70–99)
Glucose-Capillary: 141 mg/dL — ABNORMAL HIGH (ref 70–99)
Glucose-Capillary: 149 mg/dL — ABNORMAL HIGH (ref 70–99)
Glucose-Capillary: 160 mg/dL — ABNORMAL HIGH (ref 70–99)
Glucose-Capillary: 177 mg/dL — ABNORMAL HIGH (ref 70–99)

## 2014-09-15 LAB — BASIC METABOLIC PANEL
Anion gap: 11 (ref 5–15)
Anion gap: 10 (ref 5–15)
BUN: 22 mg/dL (ref 6–23)
BUN: 24 mg/dL — ABNORMAL HIGH (ref 6–23)
CALCIUM: 7.9 mg/dL — AB (ref 8.4–10.5)
CO2: 15 mmol/L — ABNORMAL LOW (ref 19–32)
CO2: 16 mmol/L — ABNORMAL LOW (ref 19–32)
CREATININE: 1.23 mg/dL — AB (ref 0.50–1.10)
Calcium: 8.1 mg/dL — ABNORMAL LOW (ref 8.4–10.5)
Chloride: 110 mEq/L (ref 96–112)
Chloride: 110 mEq/L (ref 96–112)
Creatinine, Ser: 1.47 mg/dL — ABNORMAL HIGH (ref 0.50–1.10)
GFR calc Af Amer: 39 mL/min — ABNORMAL LOW (ref >90)
GFR calc Af Amer: 48 mL/min — ABNORMAL LOW (ref >90)
GFR calc non Af Amer: 34 mL/min — ABNORMAL LOW (ref >90)
GFR, EST NON AFRICAN AMERICAN: 42 mL/min — AB (ref >90)
GLUCOSE: 153 mg/dL — AB (ref 70–99)
Glucose, Bld: 150 mg/dL — ABNORMAL HIGH (ref 70–99)
POTASSIUM: 5.1 mmol/L (ref 3.5–5.1)
Potassium: 4.2 mmol/L (ref 3.5–5.1)
SODIUM: 136 mmol/L (ref 135–145)
SODIUM: 136 mmol/L (ref 135–145)

## 2014-09-15 LAB — MAGNESIUM: MAGNESIUM: 1.5 mg/dL (ref 1.5–2.5)

## 2014-09-15 LAB — CBC
HCT: 25.8 % — ABNORMAL LOW (ref 36.0–46.0)
HEMOGLOBIN: 8.5 g/dL — AB (ref 12.0–15.0)
MCH: 29.2 pg (ref 26.0–34.0)
MCHC: 32.9 g/dL (ref 30.0–36.0)
MCV: 88.7 fL (ref 78.0–100.0)
PLATELETS: 361 10*3/uL (ref 150–400)
RBC: 2.91 MIL/uL — ABNORMAL LOW (ref 3.87–5.11)
RDW: 15.9 % — AB (ref 11.5–15.5)
WBC: 19.6 10*3/uL — ABNORMAL HIGH (ref 4.0–10.5)

## 2014-09-15 LAB — POCT I-STAT 3, ART BLOOD GAS (G3+)
Acid-base deficit: 10 mmol/L — ABNORMAL HIGH (ref 0.0–2.0)
Bicarbonate: 16.7 mEq/L — ABNORMAL LOW (ref 20.0–24.0)
O2 Saturation: 94 %
PH ART: 7.26 — AB (ref 7.350–7.450)
Patient temperature: 98.4
TCO2: 18 mmol/L (ref 0–100)
pCO2 arterial: 37 mmHg (ref 35.0–45.0)
pO2, Arterial: 81 mmHg (ref 80.0–100.0)

## 2014-09-15 LAB — HEPATIC FUNCTION PANEL
ALT: 81 U/L — ABNORMAL HIGH (ref 0–35)
AST: 37 U/L (ref 0–37)
Albumin: 2.1 g/dL — ABNORMAL LOW (ref 3.5–5.2)
Alkaline Phosphatase: 81 U/L (ref 39–117)
BILIRUBIN INDIRECT: 0.3 mg/dL (ref 0.3–0.9)
BILIRUBIN TOTAL: 0.5 mg/dL (ref 0.3–1.2)
Bilirubin, Direct: 0.2 mg/dL (ref 0.0–0.3)
Total Protein: 4.6 g/dL — ABNORMAL LOW (ref 6.0–8.3)

## 2014-09-15 LAB — URINE MICROSCOPIC-ADD ON

## 2014-09-15 LAB — VALPROIC ACID LEVEL: VALPROIC ACID LVL: 53.1 ug/mL (ref 50.0–100.0)

## 2014-09-15 LAB — SODIUM, URINE, RANDOM: Sodium, Ur: <10 mmol/L

## 2014-09-15 LAB — HEPARIN LEVEL (UNFRACTIONATED)
HEPARIN UNFRACTIONATED: 0.33 [IU]/mL (ref 0.30–0.70)
Heparin Unfractionated: 0.24 IU/mL — ABNORMAL LOW (ref 0.30–0.70)

## 2014-09-15 LAB — CREATININE, URINE, RANDOM: Creatinine, Urine: 131.14 mg/dL

## 2014-09-15 MED ORDER — SODIUM CHLORIDE 0.9 % IV BOLUS (SEPSIS)
1000.0000 mL | Freq: Once | INTRAVENOUS | Status: AC
  Administered 2014-09-15: 1000 mL via INTRAVENOUS

## 2014-09-15 MED ORDER — MAGNESIUM SULFATE IN D5W 10-5 MG/ML-% IV SOLN
1.0000 g | Freq: Once | INTRAVENOUS | Status: AC
  Administered 2014-09-15: 1 g via INTRAVENOUS
  Filled 2014-09-15: qty 100

## 2014-09-15 MED ORDER — NOREPINEPHRINE BITARTRATE 1 MG/ML IV SOLN: 2.0000 ug/min | INTRAVENOUS | Status: DC

## 2014-09-15 MED ORDER — SODIUM BICARBONATE 8.4 % IV SOLN
INTRAVENOUS | Status: DC
  Administered 2014-09-15: 11:00:00 via INTRAVENOUS
  Filled 2014-09-15 (×3): qty 150

## 2014-09-15 MED ORDER — VALPROATE SODIUM 500 MG/5ML IV SOLN
750.0000 mg | Freq: Two times a day (BID) | INTRAVENOUS | Status: DC
  Administered 2014-09-15 – 2014-09-16 (×3): 750 mg via INTRAVENOUS
  Filled 2014-09-15 (×4): qty 7.5

## 2014-09-15 MED ORDER — LEVETIRACETAM IN NACL 1000 MG/100ML IV SOLN
1000.0000 mg | Freq: Two times a day (BID) | INTRAVENOUS | Status: DC
  Administered 2014-09-15 (×3): 1000 mg via INTRAVENOUS
  Filled 2014-09-15 (×5): qty 100

## 2014-09-15 MED ORDER — NOREPINEPHRINE BITARTRATE 1 MG/ML IV SOLN: 0.0000 ug/min | INTRAVENOUS | Status: DC

## 2014-09-15 NOTE — Progress Notes (Signed)
MD aware of blood gas results

## 2014-09-15 NOTE — Progress Notes (Signed)
PULMONARY / CRITICAL CARE MEDICINE   Name: [name] MRN: [id number] DOB: Dec 14, 1940    ADMISSION DATE:  September 23, 2014 CONSULTATION DATE:  Sep 23, 2014   REFERRING MD :  Dr. Excell Seltzer   CHIEF COMPLAINT:  Cardiac arrest   INITIAL PRESENTATION:  74 year old female w/ sig h/o ETOH, CAF and recent MI. Discharged 1/8 s/p PCI w/ DES to RCA and triple drug therapy. Returned to ER s/p VF arrest on 1/9. Down time estimated 20 minutes to ROSC. STAT to cath lab: no sig change from recent cath. PCCM asked to see for hypothermia.   STUDIES:  09/07/14 echo EF 55-60% mod HK and basalinferior myocardium. High ventricular filling pressure, mild MR, PAP 33 Left heart cath 1/9: 1. Severe stenosis of the LAD and left circumflex without significant change from the previous study 2. Widely patent RCA with no significant stenosis of the recently placed stent 3. Mild LV systolic dysfunction with inferior wall akinesis and an estimated LVEF of 45-50%.  1/9 CXR Multiple lucencies projecting over the left hemithorax are nonspecific on portable chest radiograph. While these may be secondary to skin folds, pneumothorax is not excluded. Recommend attention on repeat radiograph.  Diffuse bilateral heterogeneous pulmonary opacities, favored to represent edema.  1/9 CXR 1. Interval placement right central venous line without complication. 2. Diffuse airspace disease suggesting pulmonary edema, not changed from prior. 3. Other support apparatus stable.  1/9 CT head  Currently no evidence of acute abnormality although findings of anoxic injury may require up to 12 hr to show CT manifestation. Consider MRI or follow-up CT scan in approximately 12 hr to re-evaluate.   1/10 CXR  Considerably less interstitial edema compared to 1 day prior. No new opacity. Underlying emphysema. No change in cardiac silhouette. Tube and catheter positions as described without pneumothorax.  1/11 echo 50-55%, mild LVH, mild MVR 1/11  EEG no seizures cant r/o ictal source 1/12 CXR Pulmonary edema improved, prob small b/l pleural effusions, Bibasilar consolidation, atelectasis vs pneumonitis    SIGNIFICANT EVENTS: 1/9 admit, hypothermia protocol  1/9 CT head Currently no evidence of acute abnormality although findings of anoxic injury may require up to 12 hr to show CT manifestation. Consider MRI or follow-up CT scan in approximately 12 hr to re-evaluate. 1/11 Twitching activity EEG abnl cant r/o ictal source but neg seizures. Neuro consulted  1/11 ectopy and PVCs, NSVT   SUBJECTIVE:  Unable to obtain due to mental status    RN:  Twitching Neuro following on seizure meds, NSVT, bigemy/trigeminy ectoopy and pvcs on tele  VITAL SIGNS: Temp:  [97.5 F (36.4 C)-99.3 F (37.4 C)] 99 F (37.2 C) (01/12 0800) Pulse Rate:  [25-112] 88 (01/12 0900) Resp:  [14-20] 18 (01/12 1000) BP: (133-159)/(66-102) 159/102 mmHg (01/12 0800) SpO2:  [96 %-100 %] 100 % (01/12 0900) Arterial Line BP: (87-196)/(50-109) 134/65 mmHg (01/12 1000) FiO2 (%):  [30 %] 30 % (01/12 0736) Weight:  [130 lb 11.7 oz (59.3 kg)] 130 lb 11.7 oz (59.3 kg) (01/12 0441) HEMODYNAMICS: CVP:  [8 mmHg-15 mmHg] 8 mmHg VENTILATOR SETTINGS: Vent Mode:  [-] PRVC FiO2 (%):  [30 %] 30 % Set Rate:  [18 bmp] 18 bmp Vt Set:  [490 mL] 490 mL PEEP:  [5 cmH20] 5 cmH20 Plateau Pressure:  [20 cmH20-26 cmH20] 20 cmH20 INTAKE / OUTPUT:  Intake/Output Summary (Last 24 hours) at 09/15/14 1024 Last data filed at 09/15/14 1000  Gross per 24 hour  Intake 1821.39 ml  Output    225  ml  Net 1596.39 ml    PHYSICAL EXAMINATION: General: Unresponsive s/p arrest. Lying in bed Neuro: sedated, eye twitching and myoclonus this am, not following commands, pupils constricted and minimally reactive to light b/l  HEENT: Orally intubated.  Cardiovascular: RRR no murmurs  Lungs: ctab Abdomen: Soft, non-tender + bowel sounds, nl bs  Musculoskeletal: Intact  Skin: intact   Ext: trace to 1+lower ext edema b/l  LABS:  CBC  Recent Labs Lab 09/13/14 0530 09/14/14 0500 09/15/14 0430  WBC 18.8* 11.8* 19.6*  HGB 9.0* 8.2* 8.5*  HCT 26.4* 24.0* 25.8*  PLT 321 319 361   Coag's  Recent Labs Lab 10/01/2014 1543  09/18/2014 1930 09/13/14 0315 09/13/14 1155 09/13/14 1935  APTT  --   < > 66* 135* 193* 67*  INR 1.39  --  1.50* 1.38  --   --   < > = values in this interval not displayed. BMET  Recent Labs Lab 09/14/14 0213 09/14/14 0500 09/15/14 0430  NA 137 133* 136  K 3.8 3.5 5.1  CL 113* 109 110  CO2 18* 17* 15*  BUN 17 17 22   CREATININE 0.94 0.98 1.47*  GLUCOSE 91 70 150*   Electrolytes  Recent Labs Lab 09/10/2014 1543 09/05/2014 1930  09/14/14 0213 09/14/14 0500 09/15/14 0430  CALCIUM 8.3* 6.6*  < > 7.8* 7.6* 8.1*  MG 2.2 1.6  --   --  1.5 1.5  PHOS 7.0*  --   --   --   --   --   < > = values in this interval not displayed. Sepsis Markers  Recent Labs Lab 09/28/2014 1619  LATICACIDVEN 9.53*   ABG  Recent Labs Lab 09/18/2014 2320 09/13/14 1515 09/14/14 0750  PHART 7.370 7.470* 7.304*  PCO2ART 29.6* 24.8* 35.2  PO2ART 165.0* 145.0* 75.5*   Liver Enzymes  Recent Labs Lab 09/11/2014 1543 09/22/2014 1930 09/15/14 0430  AST 110* 179* 37  ALT 83* 143* 81*  ALKPHOS 89 104 81  BILITOT 0.5 0.8 0.5  ALBUMIN 2.3* 2.0* 2.1*   Cardiac Enzymes  Recent Labs Lab 09/18/2014 2355 09/13/14 0530 09/13/14 1155  TROPONINI 2.49* 2.42* 2.13*   Glucose  Recent Labs Lab 09/14/14 1136 09/14/14 1649 09/14/14 2008 09/14/14 2358 09/15/14 0431 09/15/14 0907  GLUCAP 80 89 113* 141* 164* 177*    Imaging No results found.   ASSESSMENT / PLAN:  PULMONARY OETT 1/9>> A:  VDRF s/p cardiopulmonary arrest  Pulmonary edema improved  prob small b/l pleural effusions  Bibasilar consolidation, atelectasis vs pneumonitis  H/o COPD  P:   Continue full vent support, repeat CXR today Duoneb q6 prn  CARDIOVASCULAR CVL right IJ 1/9,  left radial A line, PIV x 1  A:  VF/cardiac arrest  CAD s/p recent stent to RCA, with sig disease LAD H/o paroxysmal AF  Bradycardia intermittently  Elevated trop down trending 3.62>>2.13  Long QT  Ectopy with PVCs and NSVT MAP goal >65   P:  Heparin gtt, Amio gtt at 30, Levo at 18  Cards following considering secondary prevention ICD if neuro status improves  Aspirin 81, Plavix 75, Pravachol 80, Lopressor 12.5 mg bid  RENAL A:   Oliguria with AKI Non anion gap metabolic acidosis   P:   Strict i/o, daily weights, trend BMET at 6 pm and am UA, urine lytes pending Will bolus NS 1 L and start bicarb infusion 150 in D5 @ 75 cc/hr  Mag 1.5 given 1 gram bolus   GASTROINTESTINAL A:  GI px  NPO with NG/OGT P:   Protonix 40 mg qd  TF   HEMATOLOGIC A:   DVT px Normocytic anemia, hbg stable (down from BL 12-14)  P:  Hep gtt  Will check fobt pending Transfusion goal <8  INFECTIOUS A:   Leukocytosis uptrending  Suspected pna vs aspiration  P:   BCx2 not obtained  UC insig growth Sputum neg MRSA neg Abx: Levaquin q48 1/9 >1/11  Monitor CBC. Hold Abx for now.  Restart Abx if clinically indicated   ENDOCRINE A:   Hyperglycemia resolved with h/o DM now with hypoglycemia  H/o hypothyroidism  P:   Monitor cbgs q4  continue SSI (2-6 units) q4  Synthyroid 50 mcg qd per tube  NEUROLOGIC A:   Sedated  Encephalopathy  Twitching with rewarm EEG neg for seizures still twitching today Myoclonus noted on exam   P:   RASS goal: -0 to -1 currently -4 Fentany gtt and prn, Versed gtt and prn-sedation off  EEG neg for seizures but neuro concerned for diffuse anoxic brain injury.  consider repeat CT today to reeval Brain for anoxia Neuro following started antiepileptics with loaded with Depacon 1000 now Keppra 1000 mg bid and Depacon 500>750 mg bid    FAMILY  - Updates: will update family today 1/12 - Inter-disciplinary family meet or Palliative Care meeting due by:   2014-10-12     TODAY'S SUMMARY:  Continue full vent support.  CAD s/p VT/VF arrest with ectopy with PVCs and non sustained VT episodes on Amio cards following. Rewarmed now with twitching/myoclonus EEG neg but concern for diffuse anoxic brain injury. Neuro following mental status with repeat CT head today.  AKI will try NS bolus, sodium bicarb. Prognosis guarded will disc with family today    09/15/2014, 10:24 AM Desma Maxim MD [phone]   Reviewed above, examined.  She has persistent myoclonus after rewarming.  Appreciate help from neurology.  She remains unresponsive.  Will f/u CT head.  Vent weaning, and further cardiac assessment deferred until issues with neuro status resolved.  Concerned that she has suffered significant neurologic injury after cardiac arrest, and will not be able to recover neuro status.  CC time by me independent of resident time/education is 40 minutes.  Coralyn Helling, MD [facility] Pulmonary/Critical Care 09/15/2014, 11:20 AM Pager:  [phone] After 3pm call: [phone]

## 2014-09-15 NOTE — Progress Notes (Signed)
Subjective:  Intubated on pressors. Breathing over the ventilator. Shows stimulus induced myoclonus.   Objective: Current vital signs: BP 133/66 mmHg  Pulse 68  Temp(Src) 98.2 F (36.8 C) (Core (Comment))  Resp 18  Ht 5\' 2"  (1.575 m)  Wt 59.3 kg (130 lb 11.7 oz)  BMI 23.91 kg/m2  SpO2 100% Vital signs in last 24 hours: Temp:  [97.5 F (36.4 C)-99.3 F (37.4 C)] 98.2 F (36.8 C) (01/12 0600) Pulse Rate:  [35-112] 68 (01/12 0736) Resp:  [17-21] 18 (01/12 0736) BP: (124-150)/(66-80) 133/66 mmHg (01/12 0736) SpO2:  [96 %-100 %] 100 % (01/12 0736) Arterial Line BP: (87-175)/(49-94) 171/76 mmHg (01/12 0600) FiO2 (%):  [30 %] 30 % (01/12 0736) Weight:  [59.3 kg (130 lb 11.7 oz)] 59.3 kg (130 lb 11.7 oz) (01/12 0441)  Intake/Output from previous day: 01/11 0701 - 01/12 0700 In: 1895.5 [I.V.:1180.5; NG/GT:460; IV Piggyback:255] Out: 175 [Urine:175] Intake/Output this shift:   Nutritional status: Diet NPO time specified  Neurologic Exam: Mental Status: Patient does not respond to verbal stimuli.  Does not respond to deep sternal rub.  Does not follow commands.  No verbalizations noted. Intubated breathing over the vent.  Shows stimulus induced myoclonus of UE when touched.  Cranial Nerves: II: patient does not respond confrontation bilaterally, pupils right 2 mm, left 2 mm,and reactive bilaterally III,IV,VI: doll's response absent bilaterally. Eyes deviated to the left V,VII: corneal reflex present bilaterally  VIII: patient does not respond to verbal stimuli IX,X: gag reflex present, XI: trapezius strength unable to test bilaterally XII: tongue strength unable to test Motor: Extremities flaccid throughout.  No spontaneous movement noted.  No purposeful movements noted. Sensory: Does not respond to noxious stimuli in any extremity. Deep Tendon Reflexes:  Absent throughout. Plantars: absent bilaterally Cerebellar: Unable to perform    Lab Results: Basic Metabolic  Panel:  Recent Labs Lab 09/13/2014 1543  09/21/2014 1930  09/13/14 1750 09/13/14 2155 09/14/14 0213 09/14/14 0500 09/15/14 0430  NA 138  < > 136  < > 138 139 137 133* 136  K 5.1  < > 4.0  < > 3.9 4.3 3.8 3.5 5.1  CL 111  < > 113*  < > 112 112 113* 109 110  CO2 12*  --  17*  < > 15* 15* 18* 17* 15*  GLUCOSE 311*  < > 301*  < > 91 70 91 70 150*  BUN 9  < > 12  < > 17 17 17 17 22   CREATININE 0.98  < > 0.86  < > 0.86 0.95 0.94 0.98 1.47*  CALCIUM 8.3*  --  6.6*  < > 8.0* 7.9* 7.8* 7.6* 8.1*  MG 2.2  --  1.6  --   --   --   --  1.5 1.5  PHOS 7.0*  --   --   --   --   --   --   --   --   < > = values in this interval not displayed.  Liver Function Tests:  Recent Labs Lab 09/05/2014 1543 09/21/2014 1930 09/15/14 0430  AST 110* 179* 37  ALT 83* 143* 81*  ALKPHOS 89 104 81  BILITOT 0.5 0.8 0.5  PROT 4.2* 4.1* 4.6*  ALBUMIN 2.3* 2.0* 2.1*   No results for input(s): LIPASE, AMYLASE in the last 168 hours. No results for input(s): AMMONIA in the last 168 hours.  CBC:  Recent Labs Lab 09/13/2014 1543  10/03/2014 1930  10/03/2014 2055 09/14/2014 2157 09/13/14  0530 09/14/14 0500 09/15/14 0430  WBC 16.8*  --  24.4*  --   --   --  18.8* 11.8* 19.6*  NEUTROABS 11.9*  --  21.5*  --   --   --   --  9.4*  --   HGB 8.9*  < > 8.9*  < > 9.9* 10.5* 9.0* 8.2* 8.5*  HCT 26.7*  < > 26.8*  < > 29.0* 31.0* 26.4* 24.0* 25.8*  MCV 91.1  --  88.2  --   --   --  84.6 83.0 88.7  PLT 294  --  347  --   --   --  321 319 361  < > = values in this interval not displayed.  Cardiac Enzymes:  Recent Labs Lab 09/09/2014 0130 09/21/2014 1930 09/15/2014 2355 09/13/14 0530 09/13/14 1155  TROPONINI 2.25* 3.62* 2.49* 2.42* 2.13*    Lipid Panel: No results for input(s): CHOL, TRIG, HDL, CHOLHDL, VLDL, LDLCALC in the last 168 hours.  CBG:  Recent Labs Lab 09/14/14 1136 09/14/14 1649 09/14/14 2008 09/14/14 2358 09/15/14 0431  GLUCAP 80 89 113* 141* 164*    Microbiology: Results for orders placed or  performed during the hospital encounter of 09/18/2014  Urine culture     Status: None   Collection Time: 10/02/2014  4:25 PM  Result Value Ref Range Status   Specimen Description URINE, RANDOM  Final   Special Requests NONE  Final   Colony Count   Final    3,000 COLONIES/ML Performed at [facility]    Culture   Final    INSIGNIFICANT GROWTH Performed at [facility]    Report Status 09/14/2014 FINAL  Final  Culture, respiratory (NON-Expectorated)     Status: None (Preliminary result)   Collection Time: 09/13/14  3:15 PM  Result Value Ref Range Status   Specimen Description TRACHEAL ASPIRATE  Final   Special Requests NONE  Final   Gram Stain PENDING  Incomplete   Culture NO GROWTH Performed at [facility]   Final   Report Status PENDING  Incomplete    Coagulation Studies:  Recent Labs  09/21/2014 1543 09/08/2014 1930 09/13/14 0315  LABPROT 17.2* 18.2* 17.1*  INR 1.39 1.50* 1.38    Imaging: Dg Chest Port 1 View  09/13/2014   CLINICAL DATA:  Respiratory failure  EXAM: PORTABLE CHEST - 1 VIEW  COMPARISON:  September 12, 2014  FINDINGS: Endotracheal tube tip is 5.4 cm above the carina. Nasogastric tube tip and side port are in the stomach. Central catheter tip is in the superior vena cava. No pneumothorax. There is underlying emphysema. There is significantly less interstitial edema compared to 1 day prior. There is no consolidation or volume loss. The heart is upper normal in size with pulmonary vascularity within normal limits. No adenopathy.  IMPRESSION: Considerably less interstitial edema compared to 1 day prior. No new opacity. Underlying emphysema. No change in cardiac silhouette. Tube and catheter positions as described without pneumothorax.   Electronically Signed   By: Bretta Bang M.D.   On: 09/13/2014 15:19    Medications:  Scheduled: . antiseptic oral rinse  7 mL Mouth Rinse QID  . aspirin  81 mg Per Tube Daily  . chlorhexidine  15 mL Mouth  Rinse BID  . clopidogrel  75 mg Per Tube Daily  . insulin aspart  2-6 Units Subcutaneous 6 times per day  . levETIRAcetam  1,000 mg Intravenous Q12H  . levothyroxine  50 mcg Per Tube  QAC breakfast  . metoprolol tartrate  12.5 mg Per Tube BID  . pantoprazole (PROTONIX) IV  40 mg Intravenous QHS  . pravastatin  80 mg Per Tube q1800  . sodium chloride  3 mL Intravenous Q12H  . valproate sodium  500 mg Intravenous Q12H   Continuous: . amiodarone 30 mg/hr (09/15/14 0002)  . feeding supplement (VITAL AF 1.2 CAL) 1,000 mL (09/15/14 1610)  . fentaNYL infusion INTRAVENOUS Stopped (09/14/14 0804)  . heparin 600 Units/hr (09/15/14 0329)  . midazolam (VERSED) infusion Stopped (09/14/14 0804)  . norepinephrine (LEVOPHED) Adult infusion 18 mcg/min (09/15/14 0200)    Assessment/Plan:  74 year old lady with probable diffuse anoxic brain injury secondary to reactive arrest on 09/12/2013. She was having myoclonic-like activity as well his remaining unresponsive to this point. Today eyes noted to be deviated to the left. EEG showed no clear epileptic activity. Prognosis is guarded at this point. Depakote level 5.3.    Recommend: 1) Continue Depakote will increase to 750 mg BID and get level in AM 2) CT head today to look for edema/ Stroke.     Felicie Morn PA-C [facility] [phone]  09/15/2014, 8:50 AM

## 2014-09-15 NOTE — Progress Notes (Signed)
Pt having increasing episodes of twitching. Neurology paged. New orders received will continue to monitor.

## 2014-09-15 NOTE — Progress Notes (Signed)
Arctic sun pads damaged when taken off to transfer to CT.  OK per MD to d/c normothermia.  normotheria scheduled to be up at 0900 1/13 anyways.

## 2014-09-15 NOTE — Progress Notes (Signed)
ANTICOAGULATION CONSULT NOTE  Pharmacy Consult for Heparin Indication:  H/o Afib  Allergies  Allergen Reactions  . Atorvastatin Other (See Comments)    Muscle aches  . Hydrochlorothiazide W-Triamterene Other (See Comments)     palpitations  . Penicillins Hives  . Statins Other (See Comments)    Subjective myalgia   Patient Measurements: Height: 5\' 2"  (157.5 cm) Weight: 131 lb 7.4 oz (59.63 kg) IBW/kg (Calculated) : 50.1 Heparin Dosing Weight: 54  Vital Signs: Temp: 98.2 F (36.8 C) (01/12 0000) Temp Source: Core (Comment) (01/12 0000) BP: 148/80 mmHg (01/11 1648) Pulse Rate: 63 (01/11 2342)  Labs:  Recent Labs  2015-03-27 1543  2015-03-27 1930  2015-03-27 2157 2015-03-27 2355  09/13/14 0315 09/13/14 0530  09/13/14 1155  09/13/14 1935  09/13/14 2155 09/14/14 0213 09/14/14 0500 09/14/14 1300 09/14/14 2350  HGB 8.9*  < > 8.9*  < > 10.5*  --   --   --  9.0*  --   --   --   --   --   --   --  8.2*  --   --   HCT 26.7*  < > 26.8*  < > 31.0*  --   --   --  26.4*  --   --   --   --   --   --   --  24.0*  --   --   PLT 294  --  347  --   --   --   --   --  321  --   --   --   --   --   --   --  319  --   --   APTT  --   < > 66*  --   --   --   --  135*  --   --  193*  --  67*  --   --   --   --   --   --   LABPROT 17.2*  --  18.2*  --   --   --   --  17.1*  --   --   --   --   --   --   --   --   --   --   --   INR 1.39  --  1.50*  --   --   --   --  1.38  --   --   --   --   --   --   --   --   --   --   --   HEPARINUNFRC  --   --   --   --   --   --   --   --  0.92*  --   --   < >  --   < >  --   --  0.40 <0.10* 0.24*  CREATININE 0.98  < > 0.86  < > 0.80 0.83  < >  --  0.87  < >  --   < >  --   --  0.95 0.94 0.98  --   --   TROPONINI  --   --  3.62*  --   --  2.49*  --   --  2.42*  --  2.13*  --   --   --   --   --   --   --   --   < > = values in  this interval not displayed. Estimated Creatinine Clearance: 39.2 mL/min (by C-G formula based on Cr of 0.98).  Assessment: 74 yo  female admitted with cardiac arrest s/p cath, h/o Afib Eliquis on hold, s/p hypothermia protocol for heparin  Goal of Therapy:  Heparin level 0.3-0.7 units/ml Monitor platelets by anticoagulation protocol: Yes     Plan:  Increase Heparin 600 units/hr Follow-up am labs.  Geannie Risen, PharmD, BCPS

## 2014-09-15 NOTE — Progress Notes (Signed)
Patient Name: [name] Date of Encounter: 09/15/2014     Active Problems:   Ventricular fibrillation   Cardiac arrest   Coronary artery disease involving native coronary artery of native heart without angina pectoris   Acute myocardial infarction of inferolateral wall, subsequent episode of care   Respiratory failure   Anoxic brain injury   Myoclonic jerking    SUBJECTIVE  The patient is off all sedation now.  She remains unresponsive.  Pupils are small and nonreactive. The patient had more frequent ectopy and nonsustained ventricular tachycardia last evening and is now on amiodarone drip.  CURRENT MEDS . antiseptic oral rinse  7 mL Mouth Rinse QID  . aspirin  81 mg Per Tube Daily  . chlorhexidine  15 mL Mouth Rinse BID  . clopidogrel  75 mg Per Tube Daily  . insulin aspart  2-6 Units Subcutaneous 6 times per day  . levETIRAcetam  1,000 mg Intravenous Q12H  . levothyroxine  50 mcg Per Tube QAC breakfast  . metoprolol tartrate  12.5 mg Per Tube BID  . pantoprazole (PROTONIX) IV  40 mg Intravenous QHS  . pravastatin  80 mg Per Tube q1800  . sodium chloride  3 mL Intravenous Q12H  . valproate sodium  500 mg Intravenous Q12H    OBJECTIVE  Filed Vitals:   09/15/14 0400 09/15/14 0441 09/15/14 0500 09/15/14 0600  BP:      Pulse:      Temp: 98.1 F (36.7 C)  99.3 F (37.4 C) 98.2 F (36.8 C)  TempSrc: Core (Comment)  Core (Comment) Core (Comment)  Resp: Height:      Weight:  130 lb 11.7 oz (59.3 kg)    SpO2: 100%   100%    Intake/Output Summary (Last 24 hours) at 09/15/14 0736 Last data filed at 09/15/14 1610  Gross per 24 hour  Intake 1895.54 ml  Output    175 ml  Net 1720.54 ml   Filed Weights   09/13/14 0500 09/14/14 0500 09/15/14 0441  Weight: 123 lb 7.3 oz (56 kg) 131 lb 7.4 oz (59.63 kg) 130 lb 11.7 oz (59.3 kg)    PHYSICAL EXAM  General: Unresponsive, on vent. HEENT:  Normal.  Pupils small and nonreactive  Neck: Supple without  bruits or JVD. Lungs:  Resp regular and unlabored, clear to auscultation anteriorly Heart: RRR no s3, s4, or murmurs. Abdomen: Soft, non-tender, non-distended, BS + x 4.  Extremities: Feet are cool and vasoconstricted.  Patient remains on low dose Levophed.   Accessory Clinical Findings  CBC  Recent Labs  09/05/2014 1930  09/13/14 0530 09/14/14 0500  WBC 24.4*  --  18.8* 11.8*  NEUTROABS 21.5*  --   --  9.4*  HGB 8.9*  < > 9.0* 8.2*  HCT 26.8*  < > 26.4* 24.0*  MCV 88.2  --  84.6 83.0  PLT 347  --  321 319  < > = values in this interval not displayed. Basic Metabolic Panel  Recent Labs  09/06/2014 1543  10/04/2014 1930  09/14/14 0500 09/15/14 0430  NA 138  < > 136  < > 133* 136  K 5.1  < > 4.0  < > 3.5 5.1  CL 111  < > 113*  < > 109 110  CO2 12*  --  17*  < > 17* 15*  GLUCOSE 311*  < > 301*  < > 70 150*  BUN 9  < > 12  < >  17 22  CREATININE 0.98  < > 0.86  < > 0.98 1.47*  CALCIUM 8.3*  --  6.6*  < > 7.6* 8.1*  MG 2.2  --  1.6  --  1.5  --   PHOS 7.0*  --   --   --   --   --   < > = values in this interval not displayed. Liver Function Tests  Recent Labs  2014/09/20 1930 09/15/14 0430  AST 179* 37  ALT 143* 81*  ALKPHOS 104 81  BILITOT 0.8 0.5  PROT 4.1* 4.6*  ALBUMIN 2.0* 2.1*   No results for input(s): LIPASE, AMYLASE in the last 72 hours. Cardiac Enzymes  Recent Labs  09-20-14 2355 09/13/14 0530 09/13/14 1155  TROPONINI 2.49* 2.42* 2.13*   BNP Invalid input(s): POCBNP D-Dimer No results for input(s): DDIMER in the last 72 hours. Hemoglobin A1C No results for input(s): HGBA1C in the last 72 hours. Fasting Lipid Panel No results for input(s): CHOL, HDL, LDLCALC, TRIG, CHOLHDL, LDLDIRECT in the last 72 hours. Thyroid Function Tests  Recent Labs  09-20-14 1930  TSH 4.093    TELE  Normal sinus rhythm with frequent PVCs  ECG    Radiology/Studies  Dg Chest Port 1 View  09/13/2014   CLINICAL DATA:  Respiratory failure  EXAM: PORTABLE CHEST -  1 VIEW  COMPARISON:  09-20-14  FINDINGS: Endotracheal tube tip is 5.4 cm above the carina. Nasogastric tube tip and side port are in the stomach. Central catheter tip is in the superior vena cava. No pneumothorax. There is underlying emphysema. There is significantly less interstitial edema compared to 1 day prior. There is no consolidation or volume loss. The heart is upper normal in size with pulmonary vascularity within normal limits. No adenopathy.  IMPRESSION: Considerably less interstitial edema compared to 1 day prior. No new opacity. Underlying emphysema. No change in cardiac silhouette. Tube and catheter positions as described without pneumothorax.   Electronically Signed   By: Bretta Bang M.D.   On: 09/13/2014 15:19   Dg Chest Port 1 View  2014/09/20   CLINICAL DATA:  Central line evaluation.  Intubated patient.  EXAM: PORTABLE CHEST - 1 VIEW  COMPARISON:  Radiograph 09/20/2014  FINDINGS: Endotracheal tube and NG tube are unchanged. Interval placement of a right central venous line with tip in the distal SVC. No pneumothorax.  Stable cardiac silhouette. There is diffuse bilateral airspace disease more dense on the right. No pleural fluid.  IMPRESSION: 1. Interval placement right central venous line without complication. 2. Diffuse airspace disease suggesting pulmonary edema, not changed from prior. 3. Other support apparatus stable.   Electronically Signed   By: Genevive Bi M.D.   On: Sep 20, 2014 19:31   Dg Chest Portable 1 View  Sep 20, 2014   CLINICAL DATA:  Patient status post CPR.  History of COPD.  EXAM: PORTABLE CHEST - 1 VIEW  COMPARISON:  09/08/2014  FINDINGS: ET tube terminates in the mid trachea. Enteric tube courses inferior to the diaphragm. Patient is rotated. Tortuosity of the thoracic aorta. Stable cardiac contours. Diffuse bilateral heterogeneous pulmonary opacities. Multiple irregular lucencies projecting over the left upper and lower hemi thorax.  IMPRESSION: Multiple  lucencies projecting over the left hemithorax are nonspecific on portable chest radiograph. While these may be secondary to skin folds, pneumothorax is not excluded. Recommend attention on repeat radiograph.  Diffuse bilateral heterogeneous pulmonary opacities, favored to represent edema.  These results will be called to the ordering clinician  or representative by the Radiologist Assistant, and communication documented in the PACS or zVision Dashboard.   Electronically Signed   By: Annia Belt M.D.   On: 09-23-14 16:53   Dg Chest Port 1 View  09/08/2014   CLINICAL DATA:  Temporary pacer.  EXAM: PORTABLE CHEST - 1 VIEW  COMPARISON:  09/07/2014.  FINDINGS: Mediastinum hilar structures are normal. Tapering pacer noted projected over right ventricle. Stable cardiomegaly. Pulmonary vascularity normal. Low lung volumes with mild basilar atelectasis. No pleural effusion or pneumothorax.  IMPRESSION: 1. Temporary pacer noted in stable position projected over the right ventricle. Stable cardiomegaly. No CHF. 2. Low lung volumes with mild basilar atelectasis .   Electronically Signed   By: Maisie Fus  Register   On: 09/08/2014 07:28   Dg Chest Port 1 View  09/07/2014   CLINICAL DATA:  Left posterior chest discomfort. Temporary pacemaker.  EXAM: PORTABLE CHEST - 1 VIEW  COMPARISON:  09/05/2014  FINDINGS: Temporary pacing lead projects in the right ventricle, unchanged.  Cardiac silhouette is mildly enlarged. No mediastinal or hilar masses or evidence of adenopathy.  Clear lungs.  No pleural effusion or pneumothorax.  Bony thorax is intact.  IMPRESSION: 1. No acute cardiopulmonary disease. No change from the prior study.   Electronically Signed   By: Amie Portland M.D.   On: 09/07/2014 09:40   Dg Chest Portable 1 View  09/05/2014   CLINICAL DATA:  Acute myocardial infarction.  EXAM: PORTABLE CHEST - 1 VIEW  COMPARISON:  04/19/2013  FINDINGS: Heart size and pulmonary vascularity are within normal limits in the lungs are  clear. No acute osseous abnormality. Slight tortuosity and calcification of the thoracic aorta. Temporary pacing lead in place.  IMPRESSION: Temporary pacing lead in place.  No acute abnormalities.   Electronically Signed   By: Geanie Cooley M.D.   On: 09/05/2014 19:01   Ct Portable Head W/o Cm  09/23/14   CLINICAL DATA:  Initial evaluation for cardiac arrest and CPR for 20 min, concern for acute hypoxic encephalopathy  EXAM: CT HEAD WITHOUT CONTRAST  TECHNIQUE: Contiguous axial images were obtained from the base of the skull through the vertex without intravenous contrast.  COMPARISON:  None.  FINDINGS: Endotracheal tube and orogastric tube noted.  Moderate diffuse atrophy. No abnormal attenuation to suggest acute vascular territory infarct. No hemorrhage or extra-axial fluid. No hydrocephalus.  IMPRESSION: Currently no evidence of acute abnormality although findings of anoxic injury may require up to 12 hr to show CT manifestation. Consider MRI or follow-up CT scan in approximately 12 hr to re-evaluate.   Electronically Signed   By: Esperanza Heir M.D.   On: 09/23/2014 21:27    ASSESSMENT AND PLAN 1. Ischemic heart disease with recent acute inferior wall myocardial infarction treated with drug-eluting stent to the right coronary artery. She has known residual disease in the proximal LAD and proximal AV circumflex. She returned after out of hospital cardiac arrest at home on 09/23/14. Repeat cardiac catheterization on 09/23/2014 shows: 1. Severe stenosis of the LAD and left circumflex without significant change from the previous study 2. Widely patent RCA with no significant stenosis of the recently placed stent 3. Mild LV systolic dysfunction with inferior wall akinesis and an estimated LVEF of 45-50%. There was no evidence of stent thrombosis. This may have been a VF arrest related to her previous inferior wall infarction with scar.  2. Hypotension 3. Patient has now completed rewarming process.   Remains unresponsive. 4. Frequent PVCs, now on  amiodarone 5.  Acute kidney injury with rising creatinine  Plan: Continue supportive care.  Recheck portable chest x-ray.  Wean levophed if possible.  Signed, Cassell Clementhomas Jerimey Burridge MD

## 2014-09-15 NOTE — Progress Notes (Signed)
At 0700 levophed gtt was at , but it will not let me chart it accurately in the I/Os. At 0915, levophed gtt was at , and would not let me chart it accurately.

## 2014-09-15 NOTE — Progress Notes (Signed)
ANTICOAGULATION CONSULT NOTE  Pharmacy Consult for Heparin Indication:  H/o Afib  Allergies  Allergen Reactions  . Atorvastatin Other (See Comments)    Muscle aches  . Hydrochlorothiazide W-Triamterene Other (See Comments)     palpitations  . Penicillins Hives  . Statins Other (See Comments)    Subjective myalgia   Patient Measurements: Height: 5\' 2"  (157.5 cm) Weight: 130 lb 11.7 oz (59.3 kg) IBW/kg (Calculated) : 50.1 Heparin Dosing Weight: 54  Vital Signs: Temp: 99.3 F (37.4 C) (01/12 1200) Temp Source: Core (Comment) (01/12 1200) BP: 130/76 mmHg (01/12 1200) Pulse Rate: 62 (01/12 1200)  Labs:  Recent Labs  05-09-2015 1543  05-09-2015 1930  05-09-2015 2355  09/13/14 0315 09/13/14 0530  09/13/14 1155  09/13/14 1935  09/14/14 0213 09/14/14 0500 09/14/14 1300 09/14/14 2350 09/15/14 0430 09/15/14 0920  HGB 8.9*  < > 8.9*  < >  --   --   --  9.0*  --   --   --   --   --   --  8.2*  --   --  8.5*  --   HCT 26.7*  < > 26.8*  < >  --   --   --  26.4*  --   --   --   --   --   --  24.0*  --   --  25.8*  --   PLT 294  --  347  --   --   --   --  321  --   --   --   --   --   --  319  --   --  361  --   APTT  --   < > 66*  --   --   --  135*  --   --  193*  --  67*  --   --   --   --   --   --   --   LABPROT 17.2*  --  18.2*  --   --   --  17.1*  --   --   --   --   --   --   --   --   --   --   --   --   INR 1.39  --  1.50*  --   --   --  1.38  --   --   --   --   --   --   --   --   --   --   --   --   HEPARINUNFRC  --   --   --   --   --   --   --  0.92*  --   --   < >  --   < >  --  0.40 <0.10* 0.24*  --  0.33  CREATININE 0.98  < > 0.86  < > 0.83  < >  --  0.87  < >  --   < >  --   < > 0.94 0.98  --   --  1.47*  --   TROPONINI  --   < > 3.62*  --  2.49*  --   --  2.42*  --  2.13*  --   --   --   --   --   --   --   --   --   < > = values in this interval  not displayed. Estimated Creatinine Clearance: 26.2 mL/min (by C-G formula based on Cr of 1.47).  Assessment: 74 yo  female admitted with cardiac arrest s/p cath, h/o Afib Eliquis on hold, s/p hypothermia protocol on heparin. Heparin level is now at goal (HL= 0.33) on 600 units/hr. Noted for CT head for edema/stroke  Goal of Therapy:  Heparin level 0.3-0.7 units/ml Monitor platelets by anticoagulation protocol: Yes     Plan:  -No heparin changes needed -Daily heparin level and CBC  Harland German, Pharm D 09/15/2014 1:20 PM

## 2014-09-16 LAB — BLOOD GAS, ARTERIAL
Acid-base deficit: 1.5 mmol/L (ref 0.0–2.0)
BICARBONATE: 21.8 meq/L (ref 20.0–24.0)
Drawn by: 418751
FIO2: 0.3 %
MECHVT: 490 mL
O2 SAT: 97.8 %
PEEP: 5 cmH2O
PH ART: 7.459 — AB (ref 7.350–7.450)
PO2 ART: 97.2 mmHg (ref 80.0–100.0)
Patient temperature: 98.6
RATE: 18 resp/min
TCO2: 22.8 mmol/L (ref 0–100)
pCO2 arterial: 31.2 mmHg — ABNORMAL LOW (ref 35.0–45.0)

## 2014-09-16 LAB — CBC
HCT: 20.5 % — ABNORMAL LOW (ref 36.0–46.0)
HCT: 21.3 % — ABNORMAL LOW (ref 36.0–46.0)
HEMOGLOBIN: 7.4 g/dL — AB (ref 12.0–15.0)
Hemoglobin: 7.1 g/dL — ABNORMAL LOW (ref 12.0–15.0)
MCH: 29.1 pg (ref 26.0–34.0)
MCH: 29.4 pg (ref 26.0–34.0)
MCHC: 34.6 g/dL (ref 30.0–36.0)
MCHC: 34.7 g/dL (ref 30.0–36.0)
MCV: 84 fL (ref 78.0–100.0)
MCV: 84.5 fL (ref 78.0–100.0)
Platelets: 220 10*3/uL (ref 150–400)
Platelets: 246 10*3/uL (ref 150–400)
RBC: 2.44 MIL/uL — ABNORMAL LOW (ref 3.87–5.11)
RBC: 2.52 MIL/uL — AB (ref 3.87–5.11)
RDW: 14.9 % (ref 11.5–15.5)
RDW: 15.2 % (ref 11.5–15.5)
WBC: 11 10*3/uL — ABNORMAL HIGH (ref 4.0–10.5)
WBC: 11.9 10*3/uL — ABNORMAL HIGH (ref 4.0–10.5)

## 2014-09-16 LAB — GLUCOSE, CAPILLARY
GLUCOSE-CAPILLARY: 157 mg/dL — AB (ref 70–99)
GLUCOSE-CAPILLARY: 154 mg/dL — AB (ref 70–99)
Glucose-Capillary: 150 mg/dL — ABNORMAL HIGH (ref 70–99)
Glucose-Capillary: 152 mg/dL — ABNORMAL HIGH (ref 70–99)
Glucose-Capillary: 154 mg/dL — ABNORMAL HIGH (ref 70–99)

## 2014-09-16 LAB — CULTURE, RESPIRATORY W GRAM STAIN

## 2014-09-16 LAB — CULTURE, RESPIRATORY

## 2014-09-16 LAB — BASIC METABOLIC PANEL
Anion gap: 6 (ref 5–15)
BUN: 23 mg/dL (ref 6–23)
CALCIUM: 7.6 mg/dL — AB (ref 8.4–10.5)
CHLORIDE: 104 meq/L (ref 96–112)
CO2: 24 mmol/L (ref 19–32)
Creatinine, Ser: 1.15 mg/dL — ABNORMAL HIGH (ref 0.50–1.10)
GFR calc Af Amer: 53 mL/min — ABNORMAL LOW (ref >90)
GFR calc non Af Amer: 45 mL/min — ABNORMAL LOW (ref >90)
Glucose, Bld: 182 mg/dL — ABNORMAL HIGH (ref 70–99)
Potassium: 3.5 mmol/L (ref 3.5–5.1)
SODIUM: 134 mmol/L — AB (ref 135–145)

## 2014-09-16 LAB — HEPARIN LEVEL (UNFRACTIONATED)
Heparin Unfractionated: 0.15 IU/mL — ABNORMAL LOW (ref 0.30–0.70)
Heparin Unfractionated: 0.21 IU/mL — ABNORMAL LOW (ref 0.30–0.70)

## 2014-09-16 LAB — VALPROIC ACID LEVEL: Valproic Acid Lvl: 45.8 ug/mL — ABNORMAL LOW (ref 50.0–100.0)

## 2014-09-16 MED ORDER — PANTOPRAZOLE SODIUM 40 MG PO PACK
40.0000 mg | PACK | ORAL | Status: DC
  Administered 2014-09-16 – 2014-09-19 (×4): 40 mg
  Filled 2014-09-16 (×4): qty 20

## 2014-09-16 MED ORDER — SODIUM CHLORIDE 0.9 % IV SOLN
INTRAVENOUS | Status: DC
  Administered 2014-09-16 – 2014-09-19 (×3): via INTRAVENOUS

## 2014-09-16 MED ORDER — LEVETIRACETAM 100 MG/ML PO SOLN
1000.0000 mg | Freq: Two times a day (BID) | ORAL | Status: DC
  Administered 2014-09-16 – 2014-09-19 (×7): 1000 mg
  Filled 2014-09-16 (×8): qty 10

## 2014-09-16 MED ORDER — POTASSIUM CHLORIDE 10 MEQ/50ML IV SOLN
10.0000 meq | INTRAVENOUS | Status: AC
  Administered 2014-09-16 (×3): 10 meq via INTRAVENOUS
  Filled 2014-09-16 (×3): qty 50

## 2014-09-16 MED ORDER — VALPROIC ACID 250 MG/5ML PO SYRP
750.0000 mg | ORAL_SOLUTION | Freq: Two times a day (BID) | ORAL | Status: DC
  Administered 2014-09-16 – 2014-09-19 (×6): 750 mg via ORAL
  Filled 2014-09-16 (×8): qty 15

## 2014-09-16 NOTE — Progress Notes (Signed)
Subjective: She continues to have stimulus-induced myoclonic jerks. No frank seizure activity recorded. No new overnight adverse events reported.  Objective: Current vital signs: BP 130/76 mmHg  Pulse 66  Temp(Src) 99 F (37.2 C) (Core (Comment))  Resp 18  Ht 5\' 2"  (1.575 m)  Wt 65.8 kg (145 lb 1 oz)  BMI 26.53 kg/m2  SpO2 100%  Neurologic Exam: Intubated and on mechanical ventilation with spontaneous respirations noted as well. Pupils were equal. Neither pupil react to light. Extraocular movements were intact with oculocephalic maneuvers, but deviated somewhat downward at rest. No facial asymmetry was noted. Muscle tone was flaccid throughout. Patient had no spontaneous movements other than myoclonic jerks. There was no abnormal posturing. Deep tendon reflexes were absent throughout. Plantar response on the left was extensor, and on the right, mute.  Medications: I have reviewed the patient's current medications.  Assessment/Plan: 74 year old lady with anoxic encephalopathy associated with cardiac arrest on 09/12/2013. Patient is shown no improvement in level of responsiveness. She is off sedating medications at this point and has remained in comatose state. Although CT scan did not show any acute intracranial abnormality, prognosis for recovery to functional state is poor.  Recommend no changes in current management. No additional neurodiagnostic studies are indicated at this time. We will continue to follow this patient with you.  C.R. Roseanne RenoStewart, MD [facility] [phone]  09/16/2014  8:38 AM

## 2014-09-16 NOTE — Progress Notes (Signed)
Spoke with daughter, Lanora Manislizabeth. Her number is [phone]. She would like to have a family meeting with the MD tomorrow 1/14. She plans to be up here with the patients spouse at 10 am tomorrow.

## 2014-09-16 NOTE — Progress Notes (Signed)
PULMONARY / CRITICAL CARE MEDICINE   Name: Erin PlummerDollie M Hlavac MRN: [id number] DOB: [birth year]    ADMISSION DATE:  09/29/2014 CONSULTATION DATE:  09/14/2014   REFERRING MD :  Dr. Excell Seltzerooper   CHIEF COMPLAINT:  Cardiac arrest   INITIAL PRESENTATION:  74 yo with VF arrest.  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  20 min before ROSC.  STUDIES:  1/09 CT head >> no acute findings 1/11 echo 50-55%, mild LVH, mild MVR 1/11 EEG no seizures cant r/o ictal source 1/12 CT head >> no acute findings  SIGNIFICANT EVENTS: 1/09 admit, hypothermia protocol  1/11 Twitching activity EEG abnl cant r/o ictal source but neg seizures. Neuro consulted  1/12 off pressors  SUBJECTIVE:  Comatose  VITAL SIGNS: Temp:  [97.7 F (36.5 C)-99.3 F (37.4 C)] 99.1 F (37.3 C) (01/13 0900) Pulse Rate:  [57-82] 66 (01/13 0800) Resp:  [0-20] 18 (01/13 0900) BP: (87-130)/(48-76) 130/76 mmHg (01/12 1200) SpO2:  [90 %-100 %] 100 % (01/13 0800) Arterial Line BP: (76-226)/(41-121) 109/52 mmHg (01/13 0900) FiO2 (%):  [30 %] 30 % (01/13 0845) Weight:  [145 lb 1 oz (65.8 kg)] 145 lb 1 oz (65.8 kg) (01/13 0600) HEMODYNAMICS: CVP:  [5 mmHg-13 mmHg] 5 mmHg VENTILATOR SETTINGS: Vent Mode:  [-] PRVC FiO2 (%):  [30 %] 30 % Set Rate:  [18 bmp] 18 bmp Vt Set:  [490 mL] 490 mL PEEP:  [5 cmH20] 5 cmH20 Plateau Pressure:  [16 cmH20-25 cmH20] 21 cmH20 INTAKE / OUTPUT:  Intake/Output Summary (Last 24 hours) at 09/16/14 0930 Last data filed at 09/16/14 0815  Gross per 24 hour  Intake 4927.8 ml  Output    725 ml  Net 4202.8 ml    PHYSICAL EXAMINATION: General: intermittent myoclonus Neuro:comatose HEENT: positive gag Cardiovascular: regular Lungs: no wheeze Abdomen: soft, non tender Musculoskeletal: 1+ edema Skin: no rashes  LABS:  CBC  Recent Labs Lab 09/15/14 0430 09/16/14 0359 09/16/14 0800  WBC 19.6* 11.0* 11.9*  HGB 8.5* 7.1* 7.4*  HCT 25.8* 20.5* 21.3*  PLT 361 220 246    Coag's  Recent Labs Lab 04-21-15 1543  04-21-15 1930 09/13/14 0315 09/13/14 1155 09/13/14 1935  APTT  --   < > 66* 135* 193* 67*  INR 1.39  --  1.50* 1.38  --   --   < > = values in this interval not displayed. BMET  Recent Labs Lab 09/15/14 0430 09/15/14 1720 09/16/14 0359  NA 136 136 134*  K 5.1 4.2 3.5  CL 110 110 104  CO2 15* 16* 24  BUN 22 24* 23  CREATININE 1.47* 1.23* 1.15*  GLUCOSE 150* 153* 182*   Electrolytes  Recent Labs Lab 04-21-15 1543 04-21-15 1930  09/14/14 0500 09/15/14 0430 09/15/14 1720 09/16/14 0359  CALCIUM 8.3* 6.6*  < > 7.6* 8.1* 7.9* 7.6*  MG 2.2 1.6  --  1.5 1.5  --   --   PHOS 7.0*  --   --   --   --   --   --   < > = values in this interval not displayed. Sepsis Markers  Recent Labs Lab 04-21-15 1619  LATICACIDVEN 9.53*   ABG  Recent Labs Lab 09/14/14 0750 09/15/14 1725 09/16/14 0346  PHART 7.304* 7.260* 7.459*  PCO2ART 35.2 37.0 31.2*  PO2ART 75.5* 81.0 97.2   Liver Enzymes  Recent Labs Lab 04-21-15 1543 04-21-15 1930 09/15/14 0430  AST 110* 179* 37  ALT 83* 143* 81*  ALKPHOS 89 104 81  BILITOT 0.5 0.8 0.5  ALBUMIN 2.3* 2.0* 2.1*   Cardiac Enzymes  Recent Labs Lab 09/04/2014 2355 09/13/14 0530 09/13/14 1155  TROPONINI 2.49* 2.42* 2.13*   Glucose  Recent Labs Lab 09/15/14 1243 09/15/14 1720 09/15/14 2025 09/16/14 0030 09/16/14 0424 09/16/14 0757  GLUCAP 160* 149* 152* 150* 157* 154*    Imaging Ct Head Wo Contrast  09/15/2014   CLINICAL DATA:  74 year old female with altered mental status and seizure today post hyperbaric chamber. Atrial fibrillation. Hyperlipidemia. Diabetes. Post cardiac arrest. Subsequent encounter.  EXAM: CT HEAD WITHOUT CONTRAST  TECHNIQUE: Contiguous axial images were obtained from the base of the skull through the vertex without intravenous contrast.  COMPARISON:  10/03/2014.  FINDINGS: No intracranial hemorrhage.  No CT evidence of large acute infarct or diffuse anoxia.   Global atrophy without hydrocephalus.  Vascular calcifications.  No intracranial mass lesion noted on this unenhanced exam.  Exophthalmos  IMPRESSION: No intracranial hemorrhage.  No CT evidence of large acute infarct or diffuse anoxia.  Global atrophy without hydrocephalus.   Electronically Signed   By: Bridgett Larsson M.D.   On: 09/15/2014 15:56   Dg Chest Port 1 View  09/15/2014   CLINICAL DATA:  Evaluate endotracheal tube placement  EXAM: PORTABLE CHEST - 1 VIEW  COMPARISON:  09/13/2014 and prior.  FINDINGS: A right approach central venous catheter tip is in the region of the cavoatrial junction. An enteric tube is seen coursing towards the stomach. An endotracheal tube tip is at the level clavicles.  The cardiac silhouette is enlarged. The mediastinal contours are within normal limits given rotation. Atherosclerotic aortic calcifications are present.  Patchy areas of consolidation are noted in the lung bases. The costophrenic angles are slightly blunted. There is no pneumothorax. There is no overt pulmonary edema.  Degenerative changes of the spine and shoulders are present.  IMPRESSION: 1. Cardiomegaly without overt pulmonary edema. 2. Bibasilar consolidation, atelectasis versus pneumonitis. 3. Probable small bilateral pleural effusions.   Electronically Signed   By: Fannie Knee   On: 09/15/2014 10:02     ASSESSMENT / PLAN:  PULMONARY OETT 1/9>> A:  Acute respiratory failure 2nd to cardiac arrest with pulmonary edema. Hx of COPD. P:   Full vent support pending further d/w family  CARDIOVASCULAR Rt IJ CVL 1/09 A line 1/09  A:  VF/cardiac arrest with hx of CAD. Cardiogenic shock >> resolved. Hx of PAF. P:  Continue asa, plavix, pravachol, lopressor Amiodarone, heparin gtt per cardiology  RENAL A:   AKI 2nd to cardiac arrest. Non anion gap metabolic acidosis >> improved. P:   D/c HCO3 from IV fluid  GASTROINTESTINAL A:   Nutrition. P:   Protonix for SUP TF    HEMATOLOGIC A:   Anemia of critical illness. P:  F/u CBC intermittently  INFECTIOUS A:   No evidence for infection. P:   Monitor clinically  ENDOCRINE A:   Hyperglycemia. H/o hypothyroidism. P:   SSI while on tube feeds synthroid  NEUROLOGIC A:   Acute encephalopathy with concern for anoxic encephalopathy, myoclonic seizures, and comatose. P:   Maintain off sedation Continue keppra, depacon  FAMILY  - Updates: will update family today 1/12 - Inter-disciplinary family meet or Palliative Care meeting due by:  09-26-2014   Will need to have d/w family about goals of care.  Prognosis for neuro recovery poor.  Coralyn Helling, MD [facility] Pulmonary/Critical Care 09/16/2014, 9:40 AM Pager:  [phone] After 3pm call: [phone]

## 2014-09-16 NOTE — Progress Notes (Addendum)
Patient Name: [name] Date of Encounter: 09/16/2014     Active Problems:   Ventricular fibrillation   Cardiac arrest   Coronary artery disease involving native coronary artery of native heart without angina pectoris   Acute myocardial infarction of inferolateral wall, subsequent episode of care   Respiratory failure   Anoxic brain injury   Myoclonic jerking    SUBJECTIVE  The patient remains unresponsive.  Off sedatives.  No further ventricular tachycardia.  Remains on amiodarone drip. Continues to have frequent myotonic jerks.  Head CT showed no evidence of intracerebral hemorrhage  CURRENT MEDS . antiseptic oral rinse  7 mL Mouth Rinse QID  . aspirin  81 mg Per Tube Daily  . chlorhexidine  15 mL Mouth Rinse BID  . clopidogrel  75 mg Per Tube Daily  . insulin aspart  2-6 Units Subcutaneous 6 times per day  . levETIRAcetam  1,000 mg Intravenous Q12H  . levothyroxine  50 mcg Per Tube QAC breakfast  . metoprolol tartrate  12.5 mg Per Tube BID  . pantoprazole (PROTONIX) IV  40 mg Intravenous QHS  . pravastatin  80 mg Per Tube q1800  . sodium chloride  3 mL Intravenous Q12H  . valproate sodium  750 mg Intravenous Q12H    OBJECTIVE  Filed Vitals:   09/16/14 0313 09/16/14 0400 09/16/14 0500 09/16/14 0600  BP:      Pulse: 61 68 64 65  Temp:  97.7 F (36.5 C)    TempSrc:  Oral    Resp: Height:      Weight:    145 lb 1 oz (65.8 kg)  SpO2: 100% 100% 100% 100%    Intake/Output Summary (Last 24 hours) at 09/16/14 0740 Last data filed at 09/16/14 0600  Gross per 24 hour  Intake 4794.3 ml  Output    705 ml  Net 4089.3 ml   Filed Weights   09/14/14 0500 09/15/14 0441 09/16/14 0600  Weight: 131 lb 7.4 oz (59.63 kg) 130 lb 11.7 oz (59.3 kg) 145 lb 1 oz (65.8 kg)    PHYSICAL EXAM  General: Unresponsive on vent.  Frequent myoclonic jerks. HEENT:  Normal  Neck: Supple without bruits or JVD. Lungs:  Resp regular and unlabored, CTA. Heart: RRR no  s3, s4, or murmurs. Abdomen: Soft, non-tender, non-distended, BS + x 4.  Extremities: Feet cool, vasoconstricted.  Accessory Clinical Findings  CBC  Recent Labs  09/14/14 0500 09/15/14 0430 09/16/14 0359  WBC 11.8* 19.6* 11.0*  NEUTROABS 9.4*  --   --   HGB 8.2* 8.5* 7.1*  HCT 24.0* 25.8* 20.5*  MCV 83.0 88.7 84.0  PLT 319 361 220   Basic Metabolic Panel  Recent Labs  09/14/14 0500 09/15/14 0430 09/15/14 1720 09/16/14 0359  NA 133* 136 136 134*  K 3.5 5.1 4.2 3.5  CL 109 110 110 104  CO2 17* 15* 16* 24  GLUCOSE 70 150* 153* 182*  BUN 17 22 24* 23  CREATININE 0.98 1.47* 1.23* 1.15*  CALCIUM 7.6* 8.1* 7.9* 7.6*  MG 1.5 1.5  --   --    Liver Function Tests  Recent Labs  09/15/14 0430  AST 37  ALT 81*  ALKPHOS 81  BILITOT 0.5  PROT 4.6*  ALBUMIN 2.1*   No results for input(s): LIPASE, AMYLASE in the last 72 hours. Cardiac Enzymes  Recent Labs  09/13/14 1155  TROPONINI 2.13*   BNP Invalid input(s): POCBNP D-Dimer No results for  input(s): DDIMER in the last 72 hours. Hemoglobin A1C No results for input(s): HGBA1C in the last 72 hours. Fasting Lipid Panel No results for input(s): CHOL, HDL, LDLCALC, TRIG, CHOLHDL, LDLDIRECT in the last 72 hours. Thyroid Function Tests No results for input(s): TSH, T4TOTAL, T3FREE, THYROIDAB in the last 72 hours.  Invalid input(s): FREET3  TELE  Normal sinus rhythm with occasional PACs.  ECG   15-Sep-2014 06:47:38 [facility] ROUTINE RECORD Unusual P axis, possible ectopic atrial rhythm with frequent Premature ventricular complexes Inferior infarct , age undetermined ST & T wave abnormality, consider anterolateral ischemia Abnormal ECG No significant change since 09/14/14 Confirmed by Eden Emms MD, PETER [phone]) on 09/15/2014 11:50:32 AM  Radiology/Studies  Ct Head Wo Contrast  09/15/2014   CLINICAL DATA:  74 year old female with altered mental status and seizure today post hyperbaric  chamber. Atrial fibrillation. Hyperlipidemia. Diabetes. Post cardiac arrest. Subsequent encounter.  EXAM: CT HEAD WITHOUT CONTRAST  TECHNIQUE: Contiguous axial images were obtained from the base of the skull through the vertex without intravenous contrast.  COMPARISON:  10/02/2014.  FINDINGS: No intracranial hemorrhage.  No CT evidence of large acute infarct or diffuse anoxia.  Global atrophy without hydrocephalus.  Vascular calcifications.  No intracranial mass lesion noted on this unenhanced exam.  Exophthalmos  IMPRESSION: No intracranial hemorrhage.  No CT evidence of large acute infarct or diffuse anoxia.  Global atrophy without hydrocephalus.   Electronically Signed   By: Bridgett Larsson M.D.   On: 09/15/2014 15:56   Dg Chest Port 1 View  09/15/2014   CLINICAL DATA:  Evaluate endotracheal tube placement  EXAM: PORTABLE CHEST - 1 VIEW  COMPARISON:  09/13/2014 and prior.  FINDINGS: A right approach central venous catheter tip is in the region of the cavoatrial junction. An enteric tube is seen coursing towards the stomach. An endotracheal tube tip is at the level clavicles.  The cardiac silhouette is enlarged. The mediastinal contours are within normal limits given rotation. Atherosclerotic aortic calcifications are present.  Patchy areas of consolidation are noted in the lung bases. The costophrenic angles are slightly blunted. There is no pneumothorax. There is no overt pulmonary edema.  Degenerative changes of the spine and shoulders are present.  IMPRESSION: 1. Cardiomegaly without overt pulmonary edema. 2. Bibasilar consolidation, atelectasis versus pneumonitis. 3. Probable small bilateral pleural effusions.   Electronically Signed   By: Fannie Knee   On: 09/15/2014 10:02   Dg Chest Port 1 View  09/13/2014   CLINICAL DATA:  Respiratory failure  EXAM: PORTABLE CHEST - 1 VIEW  COMPARISON:  September 12, 2014  FINDINGS: Endotracheal tube tip is 5.4 cm above the carina. Nasogastric tube tip and side port  are in the stomach. Central catheter tip is in the superior vena cava. No pneumothorax. There is underlying emphysema. There is significantly less interstitial edema compared to 1 day prior. There is no consolidation or volume loss. The heart is upper normal in size with pulmonary vascularity within normal limits. No adenopathy.  IMPRESSION: Considerably less interstitial edema compared to 1 day prior. No new opacity. Underlying emphysema. No change in cardiac silhouette. Tube and catheter positions as described without pneumothorax.   Electronically Signed   By: Bretta Bang M.D.   On: 09/13/2014 15:19   Dg Chest Port 1 View  09/15/2014   CLINICAL DATA:  Central line evaluation.  Intubated patient.  EXAM: PORTABLE CHEST - 1 VIEW  COMPARISON:  Radiograph 09/11/2014  FINDINGS: Endotracheal tube and NG tube are  unchanged. Interval placement of a right central venous line with tip in the distal SVC. No pneumothorax.  Stable cardiac silhouette. There is diffuse bilateral airspace disease more dense on the right. No pleural fluid.  IMPRESSION: 1. Interval placement right central venous line without complication. 2. Diffuse airspace disease suggesting pulmonary edema, not changed from prior. 3. Other support apparatus stable.   Electronically Signed   By: Genevive BiStewart  Edmunds M.D.   On: 04-16-2015 19:31   Dg Chest Portable 1 View  09/10/2014   CLINICAL DATA:  Patient status post CPR.  History of COPD.  EXAM: PORTABLE CHEST - 1 VIEW  COMPARISON:  09/08/2014  FINDINGS: ET tube terminates in the mid trachea. Enteric tube courses inferior to the diaphragm. Patient is rotated. Tortuosity of the thoracic aorta. Stable cardiac contours. Diffuse bilateral heterogeneous pulmonary opacities. Multiple irregular lucencies projecting over the left upper and lower hemi thorax.  IMPRESSION: Multiple lucencies projecting over the left hemithorax are nonspecific on portable chest radiograph. While these may be secondary to skin  folds, pneumothorax is not excluded. Recommend attention on repeat radiograph.  Diffuse bilateral heterogeneous pulmonary opacities, favored to represent edema.  These results will be called to the ordering clinician or representative by the Radiologist Assistant, and communication documented in the PACS or zVision Dashboard.   Electronically Signed   By: Annia Beltrew  Davis M.D.   On: 04-16-2015 16:53   Dg Chest Port 1 View  09/08/2014   CLINICAL DATA:  Temporary pacer.  EXAM: PORTABLE CHEST - 1 VIEW  COMPARISON:  09/07/2014.  FINDINGS: Mediastinum hilar structures are normal. Tapering pacer noted projected over right ventricle. Stable cardiomegaly. Pulmonary vascularity normal. Low lung volumes with mild basilar atelectasis. No pleural effusion or pneumothorax.  IMPRESSION: 1. Temporary pacer noted in stable position projected over the right ventricle. Stable cardiomegaly. No CHF. 2. Low lung volumes with mild basilar atelectasis .   Electronically Signed   By: Maisie Fushomas  Register   On: 09/08/2014 07:28   Dg Chest Port 1 View  09/07/2014   CLINICAL DATA:  Left posterior chest discomfort. Temporary pacemaker.  EXAM: PORTABLE CHEST - 1 VIEW  COMPARISON:  09/05/2014  FINDINGS: Temporary pacing lead projects in the right ventricle, unchanged.  Cardiac silhouette is mildly enlarged. No mediastinal or hilar masses or evidence of adenopathy.  Clear lungs.  No pleural effusion or pneumothorax.  Bony thorax is intact.  IMPRESSION: 1. No acute cardiopulmonary disease. No change from the prior study.   Electronically Signed   By: Amie Portlandavid  Ormond M.D.   On: 09/07/2014 09:40   Dg Chest Portable 1 View  09/05/2014   CLINICAL DATA:  Acute myocardial infarction.  EXAM: PORTABLE CHEST - 1 VIEW  COMPARISON:  04/19/2013  FINDINGS: Heart size and pulmonary vascularity are within normal limits in the lungs are clear. No acute osseous abnormality. Slight tortuosity and calcification of the thoracic aorta. Temporary pacing lead in place.   IMPRESSION: Temporary pacing lead in place.  No acute abnormalities.   Electronically Signed   By: Geanie CooleyJim  Maxwell M.D.   On: 09/05/2014 19:01   Ct Portable Head W/o Cm  09/24/2014   CLINICAL DATA:  Initial evaluation for cardiac arrest and CPR for 20 min, concern for acute hypoxic encephalopathy  EXAM: CT HEAD WITHOUT CONTRAST  TECHNIQUE: Contiguous axial images were obtained from the base of the skull through the vertex without intravenous contrast.  COMPARISON:  None.  FINDINGS: Endotracheal tube and orogastric tube noted.  Moderate diffuse atrophy. No abnormal  attenuation to suggest acute vascular territory infarct. No hemorrhage or extra-axial fluid. No hydrocephalus.  IMPRESSION: Currently no evidence of acute abnormality although findings of anoxic injury may require up to 12 hr to show CT manifestation. Consider MRI or follow-up CT scan in approximately 12 hr to re-evaluate.   Electronically Signed   By: Esperanza Heir M.D.   On: 10-07-2014 21:27    ASSESSMENT AND PLAN  1. Ischemic heart disease with recent acute inferior wall myocardial infarction treated with drug-eluting stent to the right coronary artery. She has known residual disease in the proximal LAD and proximal AV circumflex. She returned after out of hospital cardiac arrest at home on 10/07/14. Repeat cardiac catheterization on October 07, 2014 shows: 1. Severe stenosis of the LAD and left circumflex without significant change from the previous study 2. Widely patent RCA with no significant stenosis of the recently placed stent 3. Mild LV systolic dysfunction with inferior wall akinesis and an estimated LVEF of 45-50%. There was no evidence of stent thrombosis. This may have been a VF arrest related to her previous inferior wall infarction with scar.  2. Hypotension, resolved.  She is now off Levophed 3. Patient has now completed rewarming process. Remains unresponsive. 4. Frequent PVCs, resolved on IV amiodarone 5. Acute kidney  injury . 6.  Hypokalemia.  Will replete. 7.  Progressive anemia.  We will stop IV heparin at this point.  She is in normal sinus rhythm.  Continue daily CBC to monitor  hemoglobin closely.  Use SCDs for DVT prophylaxis  Plan: Replete potassium.  Continue IV amiodarone.  Stop IV heparin at this time.  Consider restart anticoagulation later if hemoglobin stabilizes  Signed, Cassell Clement MD

## 2014-09-17 ENCOUNTER — Inpatient Hospital Stay (HOSPITAL_COMMUNITY): Payer: Medicare Other

## 2014-09-17 ENCOUNTER — Ambulatory Visit: Payer: Medicare Other | Admitting: Nurse Practitioner

## 2014-09-17 LAB — BASIC METABOLIC PANEL
Anion gap: 11 (ref 5–15)
BUN: 22 mg/dL (ref 6–23)
CO2: 23 mmol/L (ref 19–32)
CREATININE: 1.12 mg/dL — AB (ref 0.50–1.10)
Calcium: 7.9 mg/dL — ABNORMAL LOW (ref 8.4–10.5)
Chloride: 105 mEq/L (ref 96–112)
GFR calc Af Amer: 54 mL/min — ABNORMAL LOW (ref >90)
GFR calc non Af Amer: 47 mL/min — ABNORMAL LOW (ref >90)
Glucose, Bld: 212 mg/dL — ABNORMAL HIGH (ref 70–99)
Potassium: 4 mmol/L (ref 3.5–5.1)
Sodium: 139 mmol/L (ref 135–145)

## 2014-09-17 LAB — CBC
HCT: 19.2 % — ABNORMAL LOW (ref 36.0–46.0)
Hemoglobin: 6.6 g/dL — CL (ref 12.0–15.0)
MCH: 29.2 pg (ref 26.0–34.0)
MCHC: 34.4 g/dL (ref 30.0–36.0)
MCV: 85 fL (ref 78.0–100.0)
Platelets: 206 10*3/uL (ref 150–400)
RBC: 2.26 MIL/uL — ABNORMAL LOW (ref 3.87–5.11)
RDW: 15.4 % (ref 11.5–15.5)
WBC: 12.3 10*3/uL — AB (ref 4.0–10.5)

## 2014-09-17 LAB — ABO/RH: ABO/RH(D): A POS

## 2014-09-17 LAB — LACTIC ACID, PLASMA: Lactic Acid, Venous: 1.9 mmol/L (ref 0.5–2.2)

## 2014-09-17 LAB — PREPARE RBC (CROSSMATCH)

## 2014-09-17 MED ORDER — SODIUM CHLORIDE 0.9 % IV SOLN: Freq: Once | INTRAVENOUS | Status: DC

## 2014-09-17 MED ORDER — SODIUM CHLORIDE 0.9 % IV BOLUS (SEPSIS)
500.0000 mL | Freq: Once | INTRAVENOUS | Status: AC
  Administered 2014-09-17: 500 mL via INTRAVENOUS

## 2014-09-17 NOTE — Progress Notes (Signed)
Pt became bradycardic when turning her. ~30sec run with rate in the 30s. Once head elevated HR returned to the 100s. Pt also became hypertensive sBP in the 200s. Quickly returned to normal. Dr. Patty SermonsBrackbill made aware and advised to take care when turning. Will continue to monitor closely.

## 2014-09-17 NOTE — Progress Notes (Signed)
eLink Physician-Brief Progress Note Patient Name: [name] DOB: [birth year] MRN: [id number]   Date of Service  09/17/2014  HPI/Events of Note  Anemia, Hgb 6.6 Per chart review, appears to have severe encephalopathy, day team plans goals of care conversation  eICU Interventions  Hold transfusion until goals of care conversation with family today     Intervention Category Intermediate Interventions: Diagnostic test evaluation  Rosela Supak 09/17/2014, 6:58 AM

## 2014-09-17 NOTE — Progress Notes (Signed)
NUTRITION FOLLOW-UP  INTERVENTION: Vital AF 1.2 at 33m/hr via OG tube  Provides 1296 kcals (100% of needs), 81g Protein, 8712mH20  NUTRITION DIAGNOSIS: Inadequate oral intake related to unable to eat as evidenced by NPO status; ongoing.   Goal: Meet at least 90% of estimated needs; met.   Monitor: Tolerance of TF; weight   ASSESSMENT: Pt admitted with cardiac arrest. Pt with history of alcohol abuse, smoking and recent MI.  Pt remains unresponsive, family meeting planned today.   Patient is currently intubated on ventilator support MV: 8.8 L/min Temp (24hrs), Avg:99.7 F (37.6 C), Min:98.8 F (37.1 C), Max:100.6 F (38.1 C)   Height: Ht Readings from Last 1 Encounters:  09/14/2014 _0  (1.575 m)    Weight: Wt Readings from Last 1 Encounters:  09/17/14 145 lb 15.1 oz (66.2 kg)  Admission Weight 119 lbs    BMI:  Body mass index is 26.69 kg/(m^2).  Estimated Nutritional Needs: Kcal: 1294 Protein: 65-81 Fluid: 1.5 L  Skin: intact  Diet Order: Diet NPO time specified   Intake/Output Summary (Last 24 hours) at 09/17/14 1007 Last data filed at 09/17/14 0900  Gross per 24 hour  Intake 2290.8 ml  Output    530 ml  Net 1760.8 ml    Last BM: 1/6  Labs:   Recent Labs Lab 10/03/2014 1543  09/05/2014 1930  09/14/14 0500 09/15/14 0430 09/15/14 1720 09/16/14 0359 09/17/14 0500  NA 138  < > 136  < > 133* 136 136 134* 139  K 5.1  < > 4.0  < > 3.5 5.1 4.2 3.5 4.0  CL 111  < > 113*  < > 109 110 110 104 105  CO2 12*  --  17*  < > 17* 15* 16* 24 23  BUN 9  < > 12  < > 17 22 24* 23 22  CREATININE 0.98  < > 0.86  < > 0.98 1.47* 1.23* 1.15* 1.12*  CALCIUM 8.3*  --  6.6*  < > 7.6* 8.1* 7.9* 7.6* 7.9*  MG 2.2  --  1.6  --  1.5 1.5  --   --   --   PHOS 7.0*  --   --   --   --   --   --   --   --   GLUCOSE 311*  < > 301*  < > 70 150* 153* 182* 212*  < > = values in this interval not displayed.  CBG (last 3)   Recent Labs  09/16/14 0424 09/16/14 0757  09/16/14 1227  GLUCAP 157* 154* 154*    Scheduled Meds: . sodium chloride   Intravenous Once  . antiseptic oral rinse  7 mL Mouth Rinse QID  . aspirin  81 mg Per Tube Daily  . chlorhexidine  15 mL Mouth Rinse BID  . clopidogrel  75 mg Per Tube Daily  . levETIRAcetam  1,000 mg Per Tube BID  . levothyroxine  50 mcg Per Tube QAC breakfast  . metoprolol tartrate  12.5 mg Per Tube BID  . pantoprazole sodium  40 mg Per Tube Q24H  . pravastatin  80 mg Per Tube q1800  . Valproic Acid  750 mg Oral BID    Continuous Infusions: . sodium chloride 20 mL/hr at 09/17/14 0146  . amiodarone 30 mg/hr (09/17/14 0141)  . feeding supplement (VITAL AF 1.2 CAL) 1,000 mL (09/17/14 0447)   [email or web address] [phone] Hours Pager

## 2014-09-17 NOTE — Progress Notes (Signed)
Patient Name: [name] Date of Encounter: 09/17/2014     Active Problems:   Ventricular fibrillation   Cardiac arrest   Coronary artery disease involving native coronary artery of native heart without angina pectoris   Acute myocardial infarction of inferolateral wall, subsequent episode of care   Respiratory failure   Anoxic brain injury   Myoclonic jerking    SUBJECTIVE  Patient remains unresponsive on vent.  There will be a family conference at 10 AM today to discuss goals of care. Rhythm remains stable on IV amiodarone She is off IV heparin.  Hemoglobin has dropped to 6.6.  We are holding plans for transfusion until goals of care are determined.  CURRENT MEDS . sodium chloride   Intravenous Once  . antiseptic oral rinse  7 mL Mouth Rinse QID  . aspirin  81 mg Per Tube Daily  . chlorhexidine  15 mL Mouth Rinse BID  . clopidogrel  75 mg Per Tube Daily  . levETIRAcetam  1,000 mg Per Tube BID  . levothyroxine  50 mcg Per Tube QAC breakfast  . metoprolol tartrate  12.5 mg Per Tube BID  . pantoprazole sodium  40 mg Per Tube Q24H  . pravastatin  80 mg Per Tube q1800  . Valproic Acid  750 mg Oral BID    OBJECTIVE  Filed Vitals:   09/17/14 0400 09/17/14 0415 09/17/14 0500 09/17/14 0600  BP: 117/69     Pulse: 97 88 109 107  Temp: 99.7 F (37.6 C)  99.9 F (37.7 C) 100 F (37.8 C)  TempSrc:      Resp: 18  18 18   Height:      Weight:      SpO2: 100%  100% 100%    Intake/Output Summary (Last 24 hours) at 09/17/14 0742 Last data filed at 09/17/14 0600  Gross per 24 hour  Intake 2210.1 ml  Output    550 ml  Net 1660.1 ml   Filed Weights   09/15/14 0441 09/16/14 0600 09/17/14 0200  Weight: 130 lb 11.7 oz (59.3 kg) 145 lb 1 oz (65.8 kg) 145 lb 15.1 oz (66.2 kg)    PHYSICAL EXAM  General: On vent.  Unresponsive.Marland Kitchen HEENT:  Normal  Neck: Supple without bruits or JVD. Lungs:  Resp regular and unlabored, CTA. Heart: RRR no s3, s4, or murmurs. Abdomen:  Soft, non-tender, non-distended, BS + x 4.  Extremities: No edema.  Accessory Clinical Findings  CBC  Recent Labs  09/16/14 0800 09/17/14 0500  WBC 11.9* 12.3*  HGB 7.4* 6.6*  HCT 21.3* 19.2*  MCV 84.5 85.0  PLT 246 206   Basic Metabolic Panel  Recent Labs  09/15/14 0430  09/16/14 0359 09/17/14 0500  NA 136  < > 134* 139  K 5.1  < > 3.5 4.0  CL 110  < > 104 105  CO2 15*  < > 24 23  GLUCOSE 150*  < > 182* 212*  BUN 22  < > 23 22  CREATININE 1.47*  < > 1.15* 1.12*  CALCIUM 8.1*  < > 7.6* 7.9*  MG 1.5  --   --   --   < > = values in this interval not displayed. Liver Function Tests  Recent Labs  09/15/14 0430  AST 37  ALT 81*  ALKPHOS 81  BILITOT 0.5  PROT 4.6*  ALBUMIN 2.1*   No results for input(s): LIPASE, AMYLASE in the last 72 hours. Cardiac Enzymes No results for input(s): CKTOTAL, CKMB,  CKMBINDEX, TROPONINI in the last 72 hours. BNP Invalid input(s): POCBNP D-Dimer No results for input(s): DDIMER in the last 72 hours. Hemoglobin A1C No results for input(s): HGBA1C in the last 72 hours. Fasting Lipid Panel No results for input(s): CHOL, HDL, LDLCALC, TRIG, CHOLHDL, LDLDIRECT in the last 72 hours. Thyroid Function Tests No results for input(s): TSH, T4TOTAL, T3FREE, THYROIDAB in the last 72 hours.  Invalid input(s): FREET3  TELE  Patient is in atrial flutter/fibrillation this morning.  ECG  Pending  Radiology/Studies  Ct Head Wo Contrast  09/15/2014   CLINICAL DATA:  74 year old female with altered mental status and seizure today post hyperbaric chamber. Atrial fibrillation. Hyperlipidemia. Diabetes. Post cardiac arrest. Subsequent encounter.  EXAM: CT HEAD WITHOUT CONTRAST  TECHNIQUE: Contiguous axial images were obtained from the base of the skull through the vertex without intravenous contrast.  COMPARISON:  09/22/2014.  FINDINGS: No intracranial hemorrhage.  No CT evidence of large acute infarct or diffuse anoxia.  Global atrophy without  hydrocephalus.  Vascular calcifications.  No intracranial mass lesion noted on this unenhanced exam.  Exophthalmos  IMPRESSION: No intracranial hemorrhage.  No CT evidence of large acute infarct or diffuse anoxia.  Global atrophy without hydrocephalus.   Electronically Signed   By: Bridgett Larsson M.D.   On: 09/15/2014 15:56   Dg Chest Port 1 View  09/15/2014   CLINICAL DATA:  Evaluate endotracheal tube placement  EXAM: PORTABLE CHEST - 1 VIEW  COMPARISON:  09/13/2014 and prior.  FINDINGS: A right approach central venous catheter tip is in the region of the cavoatrial junction. An enteric tube is seen coursing towards the stomach. An endotracheal tube tip is at the level clavicles.  The cardiac silhouette is enlarged. The mediastinal contours are within normal limits given rotation. Atherosclerotic aortic calcifications are present.  Patchy areas of consolidation are noted in the lung bases. The costophrenic angles are slightly blunted. There is no pneumothorax. There is no overt pulmonary edema.  Degenerative changes of the spine and shoulders are present.  IMPRESSION: 1. Cardiomegaly without overt pulmonary edema. 2. Bibasilar consolidation, atelectasis versus pneumonitis. 3. Probable small bilateral pleural effusions.   Electronically Signed   By: Fannie Knee   On: 09/15/2014 10:02   Dg Chest Port 1 View  09/13/2014   CLINICAL DATA:  Respiratory failure  EXAM: PORTABLE CHEST - 1 VIEW  COMPARISON:  September 12, 2014  FINDINGS: Endotracheal tube tip is 5.4 cm above the carina. Nasogastric tube tip and side port are in the stomach. Central catheter tip is in the superior vena cava. No pneumothorax. There is underlying emphysema. There is significantly less interstitial edema compared to 1 day prior. There is no consolidation or volume loss. The heart is upper normal in size with pulmonary vascularity within normal limits. No adenopathy.  IMPRESSION: Considerably less interstitial edema compared to 1 day prior.  No new opacity. Underlying emphysema. No change in cardiac silhouette. Tube and catheter positions as described without pneumothorax.   Electronically Signed   By: Bretta Bang M.D.   On: 09/13/2014 15:19   Dg Chest Port 1 View  09/04/2014   CLINICAL DATA:  Central line evaluation.  Intubated patient.  EXAM: PORTABLE CHEST - 1 VIEW  COMPARISON:  Radiograph 09/13/2014  FINDINGS: Endotracheal tube and NG tube are unchanged. Interval placement of a right central venous line with tip in the distal SVC. No pneumothorax.  Stable cardiac silhouette. There is diffuse bilateral airspace disease more dense on the right. No pleural  fluid.  IMPRESSION: 1. Interval placement right central venous line without complication. 2. Diffuse airspace disease suggesting pulmonary edema, not changed from prior. 3. Other support apparatus stable.   Electronically Signed   By: Genevive BiStewart  Edmunds M.D.   On: 09/07/2014 19:31   Dg Chest Portable 1 View  09/11/2014   CLINICAL DATA:  Patient status post CPR.  History of COPD.  EXAM: PORTABLE CHEST - 1 VIEW  COMPARISON:  09/08/2014  FINDINGS: ET tube terminates in the mid trachea. Enteric tube courses inferior to the diaphragm. Patient is rotated. Tortuosity of the thoracic aorta. Stable cardiac contours. Diffuse bilateral heterogeneous pulmonary opacities. Multiple irregular lucencies projecting over the left upper and lower hemi thorax.  IMPRESSION: Multiple lucencies projecting over the left hemithorax are nonspecific on portable chest radiograph. While these may be secondary to skin folds, pneumothorax is not excluded. Recommend attention on repeat radiograph.  Diffuse bilateral heterogeneous pulmonary opacities, favored to represent edema.  These results will be called to the ordering clinician or representative by the Radiologist Assistant, and communication documented in the PACS or zVision Dashboard.   Electronically Signed   By: Annia Beltrew  Davis M.D.   On: 09/14/2014 16:53   Dg Chest  Port 1 View  09/08/2014   CLINICAL DATA:  Temporary pacer.  EXAM: PORTABLE CHEST - 1 VIEW  COMPARISON:  09/07/2014.  FINDINGS: Mediastinum hilar structures are normal. Tapering pacer noted projected over right ventricle. Stable cardiomegaly. Pulmonary vascularity normal. Low lung volumes with mild basilar atelectasis. No pleural effusion or pneumothorax.  IMPRESSION: 1. Temporary pacer noted in stable position projected over the right ventricle. Stable cardiomegaly. No CHF. 2. Low lung volumes with mild basilar atelectasis .   Electronically Signed   By: Maisie Fushomas  Register   On: 09/08/2014 07:28   Dg Chest Port 1 View  09/07/2014   CLINICAL DATA:  Left posterior chest discomfort. Temporary pacemaker.  EXAM: PORTABLE CHEST - 1 VIEW  COMPARISON:  09/05/2014  FINDINGS: Temporary pacing lead projects in the right ventricle, unchanged.  Cardiac silhouette is mildly enlarged. No mediastinal or hilar masses or evidence of adenopathy.  Clear lungs.  No pleural effusion or pneumothorax.  Bony thorax is intact.  IMPRESSION: 1. No acute cardiopulmonary disease. No change from the prior study.   Electronically Signed   By: Amie Portlandavid  Ormond M.D.   On: 09/07/2014 09:40   Dg Chest Portable 1 View  09/05/2014   CLINICAL DATA:  Acute myocardial infarction.  EXAM: PORTABLE CHEST - 1 VIEW  COMPARISON:  04/19/2013  FINDINGS: Heart size and pulmonary vascularity are within normal limits in the lungs are clear. No acute osseous abnormality. Slight tortuosity and calcification of the thoracic aorta. Temporary pacing lead in place.  IMPRESSION: Temporary pacing lead in place.  No acute abnormalities.   Electronically Signed   By: Geanie CooleyJim  Maxwell M.D.   On: 09/05/2014 19:01   Ct Portable Head W/o Cm  09/18/2014   CLINICAL DATA:  Initial evaluation for cardiac arrest and CPR for 20 min, concern for acute hypoxic encephalopathy  EXAM: CT HEAD WITHOUT CONTRAST  TECHNIQUE: Contiguous axial images were obtained from the base of the skull through  the vertex without intravenous contrast.  COMPARISON:  None.  FINDINGS: Endotracheal tube and orogastric tube noted.  Moderate diffuse atrophy. No abnormal attenuation to suggest acute vascular territory infarct. No hemorrhage or extra-axial fluid. No hydrocephalus.  IMPRESSION: Currently no evidence of acute abnormality although findings of anoxic injury may require up to 12 hr to  show CT manifestation. Consider MRI or follow-up CT scan in approximately 12 hr to re-evaluate.   Electronically Signed   By: Esperanza Heir M.D.   On: October 01, 2014 21:27    ASSESSMENT AND PLAN  1. Ischemic heart disease with recent acute inferior wall myocardial infarction treated with drug-eluting stent to the right coronary artery. She has known residual disease in the proximal LAD and proximal AV circumflex. She returned after out of hospital cardiac arrest at home on October 01, 2014. Repeat cardiac catheterization on 10/01/2014 shows: 1. Severe stenosis of the LAD and left circumflex without significant change from the previous study 2. Widely patent RCA with no significant stenosis of the recently placed stent 3. Mild LV systolic dysfunction with inferior wall akinesis and an estimated LVEF of 45-50%. There was no evidence of stent thrombosis. This may have been a VF arrest related to her previous inferior wall infarction with scar.  2. Hypotension.  Off pressors 3. Patient has now completed rewarming process. Remains unresponsive.  Family conference scheduled for 10 AM today to discuss goals of care  4. Frequent PVCs, resolved on IV amiodarone.  Telemetry now showing atrial flutter fibrillation with controlled rate 5. Acute kidney injury . 6. Hypokalemia. Repleted 7. Progressive anemia.Hold off on blood transfusion until goals of care are determined. Use SCDs for DVT prophylaxis  Plan:  Continue IV amiodarone. Goals of care conference today. Signed, Cassell Clement MD

## 2014-09-17 NOTE — Progress Notes (Signed)
[facility] services. Made Inpatient Case Manager aware that [facility].   Of note, [facility] services does not replace or interfere with any services that are arranged by inpatient case management or social work.  For additional questions or referrals please contact Charlesetta ShanksVictoria Urian Martenson, RN, BSN, CCM [facility] Hospital Liaison at [phone].

## 2014-09-17 NOTE — Progress Notes (Signed)
Subjective: Patient continues on the vent, breathing over vent.  No further myoclonus but not responding to either verbal or tactile stimuli.   Objective: Current vital signs: BP 93/53 mmHg  Pulse 100  Temp(Src) 100.2 F (37.9 C) (Core (Comment))  Resp 18  Ht 5\' 2"  (1.575 m)  Wt 66.2 kg (145 lb 15.1 oz)  BMI 26.69 kg/m2  SpO2 100% Vital signs in last 24 hours: Temp:  [98.8 F (37.1 C)-100.6 F (38.1 C)] 100.2 F (37.9 C) (01/14 0700) Pulse Rate:  [41-109] 100 (01/14 0917) Resp:  [10-23] 18 (01/14 0700) BP: (77-163)/(42-84) 93/53 mmHg (01/14 0917) SpO2:  [99 %-100 %] 100 % (01/14 0700) Arterial Line BP: (82-178)/(45-116) 109/53 mmHg (01/14 0700) FiO2 (%):  [30 %] 30 % (01/14 0800) Weight:  [66.2 kg (145 lb 15.1 oz)] 66.2 kg (145 lb 15.1 oz) (01/14 0200)  Intake/Output from previous day: 01/13 0701 - 01/14 0700 In: 2291.8 [I.V.:921.8; NG/GT:1170; IV Piggyback:200] Out: 550 [Urine:550] Intake/Output this shift: Total I/O In: 111.7 [I.V.:36.7; NG/GT:75] Out: -  Nutritional status: Diet NPO time specified  Neurologic Exam: Mental Status: Patient does not respond to verbal stimuli.  Does not respond to deep sternal rub.  Does not follow commands.  No verbalizations noted.  Cranial Nerves: II: patient does not respond confrontation bilaterally, pupils right 3 mm, left 3 mm,and reactive bilaterally III,IV,VI: doll's response absent bilaterally.  V,VII: corneal reflex present bilaterally  VIII: patient does not respond to verbal stimuli IX,X: gag reflex present, XI: trapezius strength unable to test bilaterally XII: tongue strength unable to test Motor: Extremities flaccid throughout.  No spontaneous movement noted.  No purposeful movements noted. Sensory: Does not respond to noxious stimuli in any extremity. Deep Tendon Reflexes:  1+ throughout. Plantars: upgoing bilaterally Cerebellar: Unable to perform    Lab Results: Basic Metabolic Panel:  Recent Labs Lab  09/29/2014 1543  09/09/2014 1930  09/14/14 0500 09/15/14 0430 09/15/14 1720 09/16/14 0359 09/17/14 0500  NA 138  < > 136  < > 133* 136 136 134* 139  K 5.1  < > 4.0  < > 3.5 5.1 4.2 3.5 4.0  CL 111  < > 113*  < > 109 110 110 104 105  CO2 12*  --  17*  < > 17* 15* 16* 24 23  GLUCOSE 311*  < > 301*  < > 70 150* 153* 182* 212*  BUN 9  < > 12  < > 17 22 24* 23 22  CREATININE 0.98  < > 0.86  < > 0.98 1.47* 1.23* 1.15* 1.12*  CALCIUM 8.3*  --  6.6*  < > 7.6* 8.1* 7.9* 7.6* 7.9*  MG 2.2  --  1.6  --  1.5 1.5  --   --   --   PHOS 7.0*  --   --   --   --   --   --   --   --   < > = values in this interval not displayed.  Liver Function Tests:  Recent Labs Lab 09/04/2014 1543 09/16/2014 1930 09/15/14 0430  AST 110* 179* 37  ALT 83* 143* 81*  ALKPHOS 89 104 81  BILITOT 0.5 0.8 0.5  PROT 4.2* 4.1* 4.6*  ALBUMIN 2.3* 2.0* 2.1*   No results for input(s): LIPASE, AMYLASE in the last 168 hours. No results for input(s): AMMONIA in the last 168 hours.  CBC:  Recent Labs Lab 09/30/2014 1543  09/11/2014 1930  09/14/14 0500 09/15/14 0430 09/16/14 0359 09/16/14  0800 09/17/14 0500  WBC 16.8*  --  24.4*  < > 11.8* 19.6* 11.0* 11.9* 12.3*  NEUTROABS 11.9*  --  21.5*  --  9.4*  --   --   --   --   HGB 8.9*  < > 8.9*  < > 8.2* 8.5* 7.1* 7.4* 6.6*  HCT 26.7*  < > 26.8*  < > 24.0* 25.8* 20.5* 21.3* 19.2*  MCV 91.1  --  88.2  < > 83.0 88.7 84.0 84.5 85.0  PLT 294  --  347  < > 319 361 220 246 206  < > = values in this interval not displayed.  Cardiac Enzymes:  Recent Labs Lab 09/21/2014 0130 09/15/2014 1930 09/28/2014 2355 09/13/14 0530 09/13/14 1155  TROPONINI 2.25* 3.62* 2.49* 2.42* 2.13*    Lipid Panel: No results for input(s): CHOL, TRIG, HDL, CHOLHDL, VLDL, LDLCALC in the last 168 hours.  CBG:  Recent Labs Lab 09/15/14 2025 09/16/14 0030 09/16/14 0424 09/16/14 0757 09/16/14 1227  GLUCAP 152* 150* 157* 154* 154*    Microbiology: Results for orders placed or performed during the  hospital encounter of 09/05/2014  Urine culture     Status: None   Collection Time: 09/04/2014  4:25 PM  Result Value Ref Range Status   Specimen Description URINE, RANDOM  Final   Special Requests NONE  Final   Colony Count   Final    3,000 COLONIES/ML Performed at [facility]    Culture   Final    INSIGNIFICANT GROWTH Performed at [facility]    Report Status 09/14/2014 FINAL  Final  Culture, respiratory (NON-Expectorated)     Status: None   Collection Time: 09/13/14  3:15 PM  Result Value Ref Range Status   Specimen Description TRACHEAL ASPIRATE  Final   Special Requests NONE  Final   Gram Stain   Final    RARE WBC PRESENT, PREDOMINANTLY MONONUCLEAR NO SQUAMOUS EPITHELIAL CELLS SEEN NO ORGANISMS SEEN Performed at [facility]    Culture   Final    Non-Pathogenic Oropharyngeal-type Flora Isolated. Performed at [facility]    Report Status 09/16/2014 FINAL  Final    Coagulation Studies: No results for input(s): LABPROT, INR in the last 72 hours.  Imaging: Ct Head Wo Contrast  09/15/2014   CLINICAL DATA:  74 year old female with altered mental status and seizure today post hyperbaric chamber. Atrial fibrillation. Hyperlipidemia. Diabetes. Post cardiac arrest. Subsequent encounter.  EXAM: CT HEAD WITHOUT CONTRAST  TECHNIQUE: Contiguous axial images were obtained from the base of the skull through the vertex without intravenous contrast.  COMPARISON:  09/14/2014.  FINDINGS: No intracranial hemorrhage.  No CT evidence of large acute infarct or diffuse anoxia.  Global atrophy without hydrocephalus.  Vascular calcifications.  No intracranial mass lesion noted on this unenhanced exam.  Exophthalmos  IMPRESSION: No intracranial hemorrhage.  No CT evidence of large acute infarct or diffuse anoxia.  Global atrophy without hydrocephalus.   Electronically Signed   By: Bridgett Larsson M.D.   On: 09/15/2014 15:56   Dg Chest Port 1 View  09/17/2014   CLINICAL  DATA:  Cardiac arrest, intubated  EXAM: PORTABLE CHEST - 1 VIEW  COMPARISON:  09/15/2014  FINDINGS: Endotracheal tube tip 12 mm above carina. Nasogastric tube remains in the decompressed stomach. Right IJ central line stable. Mild cardiomegaly as before. Bibasilar consolidation/ atelectasis. Hazy opacity at the right lung base suggesting layering effusion. Mild central pulmonary vascular congestion. Atheromatous aortic arch. No pneumothorax.  Visualized skeletal structures are unremarkable.  IMPRESSION: 1. Low position of endotracheal tube, tip 12 mm above carina. 2. Little change in bibasilar atelectasis/consolidation, central pulmonary vascular congestion, and probable layering right effusion.   Electronically Signed   By: Oley Balm M.D.   On: 09/17/2014 08:15    Medications:  Scheduled: . sodium chloride   Intravenous Once  . antiseptic oral rinse  7 mL Mouth Rinse QID  . aspirin  81 mg Per Tube Daily  . chlorhexidine  15 mL Mouth Rinse BID  . clopidogrel  75 mg Per Tube Daily  . levETIRAcetam  1,000 mg Per Tube BID  . levothyroxine  50 mcg Per Tube QAC breakfast  . metoprolol tartrate  12.5 mg Per Tube BID  . pantoprazole sodium  40 mg Per Tube Q24H  . pravastatin  80 mg Per Tube q1800  . Valproic Acid  750 mg Oral BID    Assessment/Plan:  74 year old lady with anoxic encephalopathy associated with cardiac arrest on 09/12/2013. Patient is shown no improvement in level of responsiveness but myoclonus has stopped.  She is off sedating medications at this point and has remained in comatose state. Although CT scan did not show any acute intracranial abnormality, prognosis for recovery to functional state is poor.  Recommend no changes in current management. No additional neurodiagnostic studies are indicated at this time. We will continue to follow this patient with you.    Felicie Morn PA-C [facility] [phone]  09/17/2014, 9:29 AM

## 2014-09-17 NOTE — Progress Notes (Signed)
eLink Physician-Brief Progress Note Patient Name: [name] DOB: Feb 20, 1941 MRN: [id number]   Date of Service  09/17/2014  HPI/Events of Note  Mild hypotension, UOP OK  eICU Interventions  Bolus 500cc Saline     Intervention Category Intermediate Interventions: Hypotension - evaluation and management  Kyan Giannone 09/17/2014, 2:36 AM

## 2014-09-17 NOTE — Progress Notes (Signed)
Dr. Dow Adolphichard Kazibwe made aware of new chest xray result of low ET tube placement.

## 2014-09-17 NOTE — Progress Notes (Signed)
PULMONARY / CRITICAL CARE MEDICINE   Name: [name] MRN: [id number] DOB: 04/14/1941    ADMISSION DATE:  09/14/2014 CONSULTATION DATE:  09/18/2014   REFERRING MD :  Dr. Excell Seltzer   CHIEF COMPLAINT:  Cardiac arrest   INITIAL PRESENTATION:  74 yo with VF arrest.  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  20 min before ROSC.  STUDIES:  1/09 CT head >> no acute findings 1/11 echo 50-55%, mild LVH, mild MVR 1/11 EEG no seizures cant r/o ictal source 1/12 CT head >> no acute findings  SIGNIFICANT EVENTS: 1/09 admit, hypothermia protocol  1/11 Twitching activity EEG abnl cant r/o ictal source but neg seizures. Neuro consulted  1/12 off pressors  SUBJECTIVE:  Comatose. Not on sedation  VITAL SIGNS: Temp:  [98.8 F (37.1 C)-100.6 F (38.1 C)] 100 F (37.8 C) (01/14 0600) Pulse Rate:  [41-109] 107 (01/14 0600) Resp:  [10-23] 18 (01/14 0600) BP: (77-163)/(42-84) 117/69 mmHg (01/14 0400) SpO2:  [99 %-100 %] 100 % (01/14 0600) Arterial Line BP: (82-178)/(45-116) 127/60 mmHg (01/14 0600) FiO2 (%):  [30 %] 30 % (01/14 0415) Weight:  [145 lb 15.1 oz (66.2 kg)] 145 lb 15.1 oz (66.2 kg) (01/14 0200) HEMODYNAMICS: CVP:  [5 mmHg-7 mmHg] 7 mmHg VENTILATOR SETTINGS: Vent Mode:  [-] PRVC FiO2 (%):  [30 %] 30 % Set Rate:  [18 bmp] 18 bmp Vt Set:  [490 mL] 490 mL PEEP:  [5 cmH20] 5 cmH20 Plateau Pressure:  [21 cmH20-23 cmH20] 21 cmH20 INTAKE / OUTPUT:  Intake/Output Summary (Last 24 hours) at 09/17/14 0726 Last data filed at 09/17/14 0600  Gross per 24 hour  Intake 2210.1 ml  Output    550 ml  Net 1660.1 ml    PHYSICAL EXAMINATION: General: appears comfortable. No twitching noted today. Neuro:comatose, PEARL HEENT: positive gag Cardiovascular: regular Lungs: CTA bilaterally. no wheeze Abdomen: soft, non tender Musculoskeletal: 1+ edema Skin: no rashes  LABS:  CBC  Recent Labs Lab 09/16/14 0359 09/16/14 0800 09/17/14 0500  WBC 11.0* 11.9* 12.3*  HGB  7.1* 7.4* 6.6*  HCT 20.5* 21.3* 19.2*  PLT 220 246 206   Coag's  Recent Labs Lab 09/07/2014 1543  09/16/2014 1930 09/13/14 0315 09/13/14 1155 09/13/14 1935  APTT  --   < > 66* 135* 193* 67*  INR 1.39  --  1.50* 1.38  --   --   < > = values in this interval not displayed. BMET  Recent Labs Lab 09/15/14 1720 09/16/14 0359 09/17/14 0500  NA 136 134* 139  K 4.2 3.5 4.0  CL 110 104 105  CO2 16* 24 23  BUN 24* 23 22  CREATININE 1.23* 1.15* 1.12*  GLUCOSE 153* 182* 212*   Electrolytes  Recent Labs Lab 09/08/2014 1543 09/18/2014 1930  09/14/14 0500 09/15/14 0430 09/15/14 1720 09/16/14 0359 09/17/14 0500  CALCIUM 8.3* 6.6*  < > 7.6* 8.1* 7.9* 7.6* 7.9*  MG 2.2 1.6  --  1.5 1.5  --   --   --   PHOS 7.0*  --   --   --   --   --   --   --   < > = values in this interval not displayed. Sepsis Markers  Recent Labs Lab 10/03/2014 1619  LATICACIDVEN 9.53*   ABG  Recent Labs Lab 09/14/14 0750 09/15/14 1725 09/16/14 0346  PHART 7.304* 7.260* 7.459*  PCO2ART 35.2 37.0 31.2*  PO2ART 75.5* 81.0 97.2   Liver Enzymes  Recent Labs Lab 2015/08/30 1543 2015/08/30 1930 09/15/14 0430  AST 110* 179* 37  ALT 83* 143* 81*  ALKPHOS 89 104 81  BILITOT 0.5 0.8 0.5  ALBUMIN 2.3* 2.0* 2.1*   Cardiac Enzymes  Recent Labs Lab 2015/08/30 2355 09/13/14 0530 09/13/14 1155  TROPONINI 2.49* 2.42* 2.13*   Glucose  Recent Labs Lab 09/15/14 1720 09/15/14 2025 09/16/14 0030 09/16/14 0424 09/16/14 0757 09/16/14 1227  GLUCAP 149* 152* 150* 157* 154* 154*    Imaging No results found.   ASSESSMENT / PLAN:  PULMONARY OETT 1/9>> A:  Acute respiratory failure 2nd to cardiac arrest with pulmonary edema. Hx of COPD. P:   Full vent support for now  Will check PCXR today. Last one 2 days ago  CARDIOVASCULAR Rt IJ CVL 1/09 A line 1/09  A:  VF/cardiac arrest with hx of CAD. Cardiogenic shock >> resolved. Hx of PAF. P:  Continue asa, plavix, pravachol,  lopressor Amiodarone, heparin gtt per cardiology  RENAL A:   AKI 2nd to cardiac arrest. Non anion gap metabolic acidosis >> improved. P:   Monitor renal function. Stable at this time.   GASTROINTESTINAL A:   Nutrition. P:   Protonix for SUP TF   HEMATOLOGIC A:   Anemia of critical illness. P:  Hgb 6.6 today from 7.4 yesterday.  Hold transfusion until goals of care have been clarified today.  INFECTIOUS A:   No evidence for infection. P:   Monitor clinically  ENDOCRINE A:   Hyperglycemia. H/o hypothyroidism. P:   SSI while on tube feeds synthroid  NEUROLOGIC A:   Acute encephalopathy with concern for anoxic encephalopathy, myoclonic seizures, and comatose. P:   Maintain off sedation Continue keppra, depacon   Case discussed with Dr Craige CottaSood.  Signed:  Dow Adolphichard Kazibwe, MD PGY-3 Internal Medicine Teaching Service Pager: [phone] 09/17/2014, 7:33 AM    Reviewed above, examined.  She remains in comatose state.  She has not received any sedation for several days.  She remains on full vent support.    Had extensive discussion with pt's family this morning.  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  They are considering their options about how to proceed from here.  I explained that vent withdrawal and comfort measures should be option to consider.  They will notify medical team once decision is made.  If family wishes to continue aggressive care, then she would need early tracheostomy.  Coralyn HellingVineet Sharis Keeran, MD [facility]monary/Critical [facility] 09/17/2014, 12:10 PM Pager:  [phone] After 3pm call: [phone]

## 2014-09-17 NOTE — Progress Notes (Signed)
Patient adducted lower extremities when cold spray was  applied to the perineum.

## 2014-09-18 LAB — CBC
HCT: 22.9 % — ABNORMAL LOW (ref 36.0–46.0)
Hemoglobin: 7.8 g/dL — ABNORMAL LOW (ref 12.0–15.0)
MCH: 29.9 pg (ref 26.0–34.0)
MCHC: 34.1 g/dL (ref 30.0–36.0)
MCV: 87.7 fL (ref 78.0–100.0)
Platelets: 278 10*3/uL (ref 150–400)
RBC: 2.61 MIL/uL — ABNORMAL LOW (ref 3.87–5.11)
RDW: 16.2 % — ABNORMAL HIGH (ref 11.5–15.5)
WBC: 10.4 10*3/uL (ref 4.0–10.5)

## 2014-09-18 LAB — BASIC METABOLIC PANEL
Anion gap: 9 (ref 5–15)
BUN: 26 mg/dL — ABNORMAL HIGH (ref 6–23)
CHLORIDE: 103 meq/L (ref 96–112)
CO2: 25 mmol/L (ref 19–32)
CREATININE: 1.17 mg/dL — AB (ref 0.50–1.10)
Calcium: 8 mg/dL — ABNORMAL LOW (ref 8.4–10.5)
GFR calc Af Amer: 51 mL/min — ABNORMAL LOW (ref >90)
GFR, EST NON AFRICAN AMERICAN: 44 mL/min — AB (ref >90)
Glucose, Bld: 252 mg/dL — ABNORMAL HIGH (ref 70–99)
Potassium: 3.8 mmol/L (ref 3.5–5.1)
Sodium: 137 mmol/L (ref 135–145)

## 2014-09-18 MED ORDER — FENTANYL CITRATE 0.05 MG/ML IJ SOLN: 50.0000 ug | INTRAMUSCULAR | Status: DC | PRN

## 2014-09-18 MED ORDER — ATROPINE SULFATE 0.1 MG/ML IJ SOLN
INTRAMUSCULAR | Status: AC
  Filled 2014-09-18: qty 10

## 2014-09-18 MED ORDER — FUROSEMIDE 10 MG/ML IJ SOLN
40.0000 mg | Freq: Once | INTRAMUSCULAR | Status: AC
  Administered 2014-09-18: 40 mg via INTRAVENOUS
  Filled 2014-09-18: qty 4

## 2014-09-18 MED ORDER — MIDAZOLAM HCL 2 MG/2ML IJ SOLN
2.0000 mg | INTRAMUSCULAR | Status: DC | PRN
  Administered 2014-09-18 – 2014-09-19 (×4): 2 mg via INTRAVENOUS
  Filled 2014-09-18 (×5): qty 2

## 2014-09-18 MED ORDER — METOPROLOL TARTRATE 1 MG/ML IV SOLN: 2.5000 mg | INTRAVENOUS | Status: DC | PRN

## 2014-09-18 MED ORDER — FENTANYL CITRATE 0.05 MG/ML IJ SOLN
INTRAMUSCULAR | Status: AC
  Administered 2014-09-18: 50 ug
  Filled 2014-09-18: qty 2

## 2014-09-18 MED ORDER — FENTANYL CITRATE 0.05 MG/ML IJ SOLN
50.0000 ug | INTRAMUSCULAR | Status: DC | PRN
  Administered 2014-09-18 – 2014-09-19 (×6): 50 ug via INTRAVENOUS
  Filled 2014-09-18 (×6): qty 2

## 2014-09-18 MED ORDER — MIDAZOLAM HCL 2 MG/2ML IJ SOLN
INTRAMUSCULAR | Status: AC
  Administered 2014-09-18: 2 mg
  Filled 2014-09-18: qty 2

## 2014-09-18 MED ORDER — EPINEPHRINE HCL 0.1 MG/ML IJ SOSY
PREFILLED_SYRINGE | INTRAMUSCULAR | Status: AC
  Filled 2014-09-18: qty 10

## 2014-09-18 NOTE — Progress Notes (Signed)
PULMONARY / CRITICAL CARE MEDICINE   Name: Erin PlummerDollie M Depuy MRN: [id number] DOB: [birth year]    ADMISSION DATE:  10/01/2014 CONSULTATION DATE:  09/09/2014   REFERRING MD :  Dr. Excell Seltzerooper   CHIEF COMPLAINT:  Cardiac arrest   INITIAL PRESENTATION:  74 yo with VF arrest.  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  20 min before ROSC.  STUDIES:  1/09 CT head >> no acute findings 1/11 echo 50-55%, mild LVH, mild MVR 1/11 EEG no seizures cant r/o ictal source 1/12 CT head >> no acute findings  SIGNIFICANT EVENTS: 1/09 admit, hypothermia protocol  1/11 Twitching activity EEG abnl cant r/o ictal source but neg seizures. Neuro consulted  1/12 off pressors 1/14 Family meeting for GOC held.   SUBJECTIVE:  Remains deeply comatose.  She got agitated this morning with BP and HR going up. Her O2 sats dropped down too.  VITAL SIGNS: Temp:  [99 F (37.2 C)-100.6 F (38.1 C)] 99.1 F (37.3 C) (01/15 0700) Pulse Rate:  [60-114] 85 (01/15 0700) Resp:  [0-26] 19 (01/15 0700) BP: (90-199)/(49-115) 130/64 mmHg (01/15 0445) SpO2:  [95 %-100 %] 100 % (01/15 0700) Arterial Line BP: (83-215)/(47-112) 181/79 mmHg (01/15 0700) FiO2 (%):  [3 %-30 %] 30 % (01/15 0453) Weight:  [148 lb 9.4 oz (67.4 kg)] 148 lb 9.4 oz (67.4 kg) (01/15 0457) HEMODYNAMICS:   VENTILATOR SETTINGS: Vent Mode:  [-] PRVC FiO2 (%):  [3 %-30 %] 30 % Set Rate:  [18 bmp] 18 bmp Vt Set:  [490 mL] 490 mL PEEP:  [5 cmH20] 5 cmH20 Plateau Pressure:  [15 cmH20-24 cmH20] 21 cmH20 INTAKE / OUTPUT:  Intake/Output Summary (Last 24 hours) at 09/18/14 0728 Last data filed at 09/18/14 0700  Gross per 24 hour  Intake 2310.8 ml  Output    690 ml  Net 1620.8 ml    PHYSICAL EXAMINATION: General: appears comfortable. Noticed eye twitching bilaterally Neuro: PEARL, agitation HEENT: positive gag Cardiovascular: regular Lungs: CTA bilaterally. Wheezing bilaterally Abdomen: soft, non tender Musculoskeletal: 1+ edema Skin:  no rashes  LABS:  CBC  Recent Labs Lab 09/16/14 0800 09/17/14 0500 09/18/14 0429  WBC 11.9* 12.3* 10.4  HGB 7.4* 6.6* 7.8*  HCT 21.3* 19.2* 22.9*  PLT 246 206 278   Coag's  Recent Labs Lab 09/08/2014 1543  09/18/2014 1930 09/13/14 0315 09/13/14 1155 09/13/14 1935  APTT  --   < > 66* 135* 193* 67*  INR 1.39  --  1.50* 1.38  --   --   < > = values in this interval not displayed. BMET  Recent Labs Lab 09/16/14 0359 09/17/14 0500 09/18/14 0429  NA 134* 139 137  K 3.5 4.0 3.8  CL 104 105 103  CO2 24 23 25   BUN 23 22 26*  CREATININE 1.15* 1.12* 1.17*  GLUCOSE 182* 212* 252*   Electrolytes  Recent Labs Lab 09/24/2014 1543 09/04/2014 1930  09/14/14 0500 09/15/14 0430  09/16/14 0359 09/17/14 0500 09/18/14 0429  CALCIUM 8.3* 6.6*  < > 7.6* 8.1*  < > 7.6* 7.9* 8.0*  MG 2.2 1.6  --  1.5 1.5  --   --   --   --   PHOS 7.0*  --   --   --   --   --   --   --   --   < > = values in this interval not displayed. Sepsis Markers  Recent Labs Lab 09/17/2014 1619 09/17/14 0935  LATICACIDVEN  9.53* 1.9   ABG  Recent Labs Lab 09/14/14 0750 09/15/14 1725 09/16/14 0346  PHART 7.304* 7.260* 7.459*  PCO2ART 35.2 37.0 31.2*  PO2ART 75.5* 81.0 97.2   Liver Enzymes  Recent Labs Lab Sep 21, 2014 1543 2014/09/21 1930 09/15/14 0430  AST 110* 179* 37  ALT 83* 143* 81*  ALKPHOS 89 104 81  BILITOT 0.5 0.8 0.5  ALBUMIN 2.3* 2.0* 2.1*   Cardiac Enzymes  Recent Labs Lab 09/21/2014 2355 09/13/14 0530 09/13/14 1155  TROPONINI 2.49* 2.42* 2.13*   Glucose  Recent Labs Lab 09/15/14 1720 09/15/14 2025 09/16/14 0030 09/16/14 0424 09/16/14 0757 09/16/14 1227  GLUCAP 149* 152* 150* 157* 154* 154*    Imaging Dg Chest Port 1 View  09/17/2014   CLINICAL DATA:  Cardiac arrest, intubated  EXAM: PORTABLE CHEST - 1 VIEW  COMPARISON:  09/15/2014  FINDINGS: Endotracheal tube tip 12 mm above carina. Nasogastric tube remains in the decompressed stomach. Right IJ central line stable.  Mild cardiomegaly as before. Bibasilar consolidation/ atelectasis. Hazy opacity at the right lung base suggesting layering effusion. Mild central pulmonary vascular congestion. Atheromatous aortic arch. No pneumothorax. Visualized skeletal structures are unremarkable.  IMPRESSION: 1. Low position of endotracheal tube, tip 12 mm above carina. 2. Little change in bibasilar atelectasis/consolidation, central pulmonary vascular congestion, and probable layering right effusion.   Electronically Signed   By: Oley Balm M.D.   On: 09/17/2014 08:15     ASSESSMENT / PLAN:  PULMONARY OETT 1/9>> A:  Acute respiratory failure 2nd to cardiac arrest with pulmonary edema. Hx of COPD. P:   Full vent support for now  Will aim for early trach if family elects for aggressive treatment  Possible pulmonary edema. Will try lasix 40 once  pcxr tomorrow   CARDIOVASCULAR Rt IJ CVL 1/09 A line 1/09  A:  VF/cardiac arrest with hx of CAD. Cardiogenic shock >> resolved. Hx of PAF. HTN with agitation P:  Continue asa, plavix, pravachol, lopressor Amiodarone, off heparin gtt Likely BP with go down with analgesic and versed Will monitor clinically  RENAL A:   AKI 2nd to cardiac arrest. Non anion gap metabolic acidosis >> improved. P:   Monitor renal function. Stable at this time.   GASTROINTESTINAL A:   Nutrition. P:   Protonix for SUP TF   HEMATOLOGIC A:   Anemia of critical illness. P:  Hgb 6.6 improved to 7.8 Transfusion was deferred yesterday pending family decision on GOC  INFECTIOUS A:   No evidence for infection. P:   Monitor clinically  ENDOCRINE A:   Hyperglycemia. H/o hypothyroidism. P:   SSI while on tube feeds synthroid  NEUROLOGIC A:   Acute encephalopathy with concern for anoxic encephalopathy, myoclonic seizures, and comatose. Agitation  P:   Maintain off sedation Continue keppra, depacon Start PAD phase 1 with fentanyl and versed   Awaiting family for  deciding on GOC.   Case discussed with Dr Craige Cotta.   Signed:  Dow Adolph, MD PGY-3 Internal Medicine Teaching Service Pager: [phone] 09/18/2014, 7:28 AM   Reviewed above, examined.  She has increased BP and increased WOB.  Will add prn versed, fentanyl to ensure comfort and vent synchrony.  Will give lasix IV x one.  Had detailed d/w family on 1/14 >> recommending comfort care, but husband having difficulty coming to terms with this.  Coralyn Helling, MD [facility] Pulmonary/Critical Care 09/18/2014, 11:05 AM Pager:  [phone] After 3pm call: [phone]

## 2014-09-18 NOTE — Progress Notes (Signed)
Dr. Craige CottaSood and Carlynn Purlarrie,RRT at bedside.  Orders given and initiated.

## 2014-09-18 NOTE — Progress Notes (Signed)
Patient Name: [name] Date of Encounter: 09/18/2014     Active Problems:   Ventricular fibrillation   Cardiac arrest   Coronary artery disease involving native coronary artery of native heart without angina pectoris   Acute myocardial infarction of inferolateral wall, subsequent episode of care   Respiratory failure   Anoxic brain injury   Myoclonic jerking    SUBJECTIVE  Patient remains unresponsive on vent.  She is having frequent myoclonic jerks today.  Family is to decide regarding goals of care.  There was an initial family conference yesterday with no consensus reached among the family. Her blood pressure is high today Rhythm remains sinus tachycardia with occasional PVC.  She remains on lidocaine drip.  CURRENT MEDS . sodium chloride   Intravenous Once  . antiseptic oral rinse  7 mL Mouth Rinse QID  . aspirin  81 mg Per Tube Daily  . chlorhexidine  15 mL Mouth Rinse BID  . clopidogrel  75 mg Per Tube Daily  . levETIRAcetam  1,000 mg Per Tube BID  . levothyroxine  50 mcg Per Tube QAC breakfast  . metoprolol tartrate  12.5 mg Per Tube BID  . pantoprazole sodium  40 mg Per Tube Q24H  . pravastatin  80 mg Per Tube q1800  . Valproic Acid  750 mg Oral BID    OBJECTIVE  Filed Vitals:   09/18/14 0630 09/18/14 0645 09/18/14 0700 09/18/14 0720  BP:    208/104  Pulse: 86 77 85 89  Temp: 99.1 F (37.3 C) 99.1 F (37.3 C) 99.1 F (37.3 C)   TempSrc:      Resp: 18 18 19 19   Height:      Weight:      SpO2: 100% 100% 100% 100%    Intake/Output Summary (Last 24 hours) at 09/18/14 0807 Last data filed at 09/18/14 0700  Gross per 24 hour  Intake 2199.1 ml  Output    690 ml  Net 1509.1 ml   Filed Weights   09/16/14 0600 09/17/14 0200 09/18/14 0457  Weight: 145 lb 1 oz (65.8 kg) 145 lb 15.1 oz (66.2 kg) 148 lb 9.4 oz (67.4 kg)    PHYSICAL EXAM  General: On vent, intubated, unresponsive. HEENT:  Normal  Neck: Supple without bruits or JVD. Lungs:   Coarse chest sounds and rhonchi bilaterally Heart: RRR no s3, s4, or murmurs. Abdomen: Soft, non-tender, non-distended, BS + x 4.  Extremities: No clubbing, cyanosis or edema. DP/PT/Radials 1+ and equal bilaterally.  Accessory Clinical Findings  CBC  Recent Labs  09/17/14 0500 09/18/14 0429  WBC 12.3* 10.4  HGB 6.6* 7.8*  HCT 19.2* 22.9*  MCV 85.0 87.7  PLT 206 278   Basic Metabolic Panel  Recent Labs  09/17/14 0500 09/18/14 0429  NA 139 137  K 4.0 3.8  CL 105 103  CO2 23 25  GLUCOSE 212* 252*  BUN 22 26*  CREATININE 1.12* 1.17*  CALCIUM 7.9* 8.0*   Liver Function Tests No results for input(s): AST, ALT, ALKPHOS, BILITOT, PROT, ALBUMIN in the last 72 hours. No results for input(s): LIPASE, AMYLASE in the last 72 hours. Cardiac Enzymes No results for input(s): CKTOTAL, CKMB, CKMBINDEX, TROPONINI in the last 72 hours. BNP Invalid input(s): POCBNP D-Dimer No results for input(s): DDIMER in the last 72 hours. Hemoglobin A1C No results for input(s): HGBA1C in the last 72 hours. Fasting Lipid Panel No results for input(s): CHOL, HDL, LDLCALC, TRIG, CHOLHDL, LDLDIRECT in the last 72 hours.  Thyroid Function Tests No results for input(s): TSH, T4TOTAL, T3FREE, THYROIDAB in the last 72 hours.  Invalid input(s): FREET3  TELE  Sinus tachycardia.  Occasional PVCs.    Radiology/Studies  Ct Head Wo Contrast  09/15/2014   CLINICAL DATA:  74 year old female with altered mental status and seizure today post hyperbaric chamber. Atrial fibrillation. Hyperlipidemia. Diabetes. Post cardiac arrest. Subsequent encounter.  EXAM: CT HEAD WITHOUT CONTRAST  TECHNIQUE: Contiguous axial images were obtained from the base of the skull through the vertex without intravenous contrast.  COMPARISON:  2014/10/10.  FINDINGS: No intracranial hemorrhage.  No CT evidence of large acute infarct or diffuse anoxia.  Global atrophy without hydrocephalus.  Vascular calcifications.  No intracranial  mass lesion noted on this unenhanced exam.  Exophthalmos  IMPRESSION: No intracranial hemorrhage.  No CT evidence of large acute infarct or diffuse anoxia.  Global atrophy without hydrocephalus.   Electronically Signed   By: Bridgett Larsson M.D.   On: 09/15/2014 15:56   Dg Chest Port 1 View  09/17/2014   CLINICAL DATA:  Cardiac arrest, intubated  EXAM: PORTABLE CHEST - 1 VIEW  COMPARISON:  09/15/2014  FINDINGS: Endotracheal tube tip 12 mm above carina. Nasogastric tube remains in the decompressed stomach. Right IJ central line stable. Mild cardiomegaly as before. Bibasilar consolidation/ atelectasis. Hazy opacity at the right lung base suggesting layering effusion. Mild central pulmonary vascular congestion. Atheromatous aortic arch. No pneumothorax. Visualized skeletal structures are unremarkable.  IMPRESSION: 1. Low position of endotracheal tube, tip 12 mm above carina. 2. Little change in bibasilar atelectasis/consolidation, central pulmonary vascular congestion, and probable layering right effusion.   Electronically Signed   By: Oley Balm M.D.   On: 09/17/2014 08:15   Dg Chest Port 1 View  09/15/2014   CLINICAL DATA:  Evaluate endotracheal tube placement  EXAM: PORTABLE CHEST - 1 VIEW  COMPARISON:  09/13/2014 and prior.  FINDINGS: A right approach central venous catheter tip is in the region of the cavoatrial junction. An enteric tube is seen coursing towards the stomach. An endotracheal tube tip is at the level clavicles.  The cardiac silhouette is enlarged. The mediastinal contours are within normal limits given rotation. Atherosclerotic aortic calcifications are present.  Patchy areas of consolidation are noted in the lung bases. The costophrenic angles are slightly blunted. There is no pneumothorax. There is no overt pulmonary edema.  Degenerative changes of the spine and shoulders are present.  IMPRESSION: 1. Cardiomegaly without overt pulmonary edema. 2. Bibasilar consolidation, atelectasis versus  pneumonitis. 3. Probable small bilateral pleural effusions.   Electronically Signed   By: Fannie Knee   On: 09/15/2014 10:02   Dg Chest Port 1 View  09/13/2014   CLINICAL DATA:  Respiratory failure  EXAM: PORTABLE CHEST - 1 VIEW  COMPARISON:  October 10, 2014  FINDINGS: Endotracheal tube tip is 5.4 cm above the carina. Nasogastric tube tip and side port are in the stomach. Central catheter tip is in the superior vena cava. No pneumothorax. There is underlying emphysema. There is significantly less interstitial edema compared to 1 day prior. There is no consolidation or volume loss. The heart is upper normal in size with pulmonary vascularity within normal limits. No adenopathy.  IMPRESSION: Considerably less interstitial edema compared to 1 day prior. No new opacity. Underlying emphysema. No change in cardiac silhouette. Tube and catheter positions as described without pneumothorax.   Electronically Signed   By: Bretta Bang M.D.   On: 09/13/2014 15:19   Dg Chest [facility]  1 View  09-30-2014   CLINICAL DATA:  Central line evaluation.  Intubated patient.  EXAM: PORTABLE CHEST - 1 VIEW  COMPARISON:  Radiograph 2014/09/30  FINDINGS: Endotracheal tube and NG tube are unchanged. Interval placement of a right central venous line with tip in the distal SVC. No pneumothorax.  Stable cardiac silhouette. There is diffuse bilateral airspace disease more dense on the right. No pleural fluid.  IMPRESSION: 1. Interval placement right central venous line without complication. 2. Diffuse airspace disease suggesting pulmonary edema, not changed from prior. 3. Other support apparatus stable.   Electronically Signed   By: Genevive Bi M.D.   On: 09-30-2014 19:31   Dg Chest Portable 1 View  2014-09-30   CLINICAL DATA:  Patient status post CPR.  History of COPD.  EXAM: PORTABLE CHEST - 1 VIEW  COMPARISON:  09/08/2014  FINDINGS: ET tube terminates in the mid trachea. Enteric tube courses inferior to the diaphragm. Patient  is rotated. Tortuosity of the thoracic aorta. Stable cardiac contours. Diffuse bilateral heterogeneous pulmonary opacities. Multiple irregular lucencies projecting over the left upper and lower hemi thorax.  IMPRESSION: Multiple lucencies projecting over the left hemithorax are nonspecific on portable chest radiograph. While these may be secondary to skin folds, pneumothorax is not excluded. Recommend attention on repeat radiograph.  Diffuse bilateral heterogeneous pulmonary opacities, favored to represent edema.  These results will be called to the ordering clinician or representative by the Radiologist Assistant, and communication documented in the PACS or zVision Dashboard.   Electronically Signed   By: Annia Belt M.D.   On: 2014-09-30 16:53   Dg Chest Port 1 View  09/08/2014   CLINICAL DATA:  Temporary pacer.  EXAM: PORTABLE CHEST - 1 VIEW  COMPARISON:  09/07/2014.  FINDINGS: Mediastinum hilar structures are normal. Tapering pacer noted projected over right ventricle. Stable cardiomegaly. Pulmonary vascularity normal. Low lung volumes with mild basilar atelectasis. No pleural effusion or pneumothorax.  IMPRESSION: 1. Temporary pacer noted in stable position projected over the right ventricle. Stable cardiomegaly. No CHF. 2. Low lung volumes with mild basilar atelectasis .   Electronically Signed   By: Maisie Fus  Register   On: 09/08/2014 07:28   Dg Chest Port 1 View  09/07/2014   CLINICAL DATA:  Left posterior chest discomfort. Temporary pacemaker.  EXAM: PORTABLE CHEST - 1 VIEW  COMPARISON:  09/05/2014  FINDINGS: Temporary pacing lead projects in the right ventricle, unchanged.  Cardiac silhouette is mildly enlarged. No mediastinal or hilar masses or evidence of adenopathy.  Clear lungs.  No pleural effusion or pneumothorax.  Bony thorax is intact.  IMPRESSION: 1. No acute cardiopulmonary disease. No change from the prior study.   Electronically Signed   By: Amie Portland M.D.   On: 09/07/2014 09:40   Dg  Chest Portable 1 View  09/05/2014   CLINICAL DATA:  Acute myocardial infarction.  EXAM: PORTABLE CHEST - 1 VIEW  COMPARISON:  04/19/2013  FINDINGS: Heart size and pulmonary vascularity are within normal limits in the lungs are clear. No acute osseous abnormality. Slight tortuosity and calcification of the thoracic aorta. Temporary pacing lead in place.  IMPRESSION: Temporary pacing lead in place.  No acute abnormalities.   Electronically Signed   By: Geanie Cooley M.D.   On: 09/05/2014 19:01   Ct Portable Head W/o Cm  Sep 30, 2014   CLINICAL DATA:  Initial evaluation for cardiac arrest and CPR for 20 min, concern for acute hypoxic encephalopathy  EXAM: CT HEAD WITHOUT CONTRAST  TECHNIQUE:  Contiguous axial images were obtained from the base of the skull through the vertex without intravenous contrast.  COMPARISON:  None.  FINDINGS: Endotracheal tube and orogastric tube noted.  Moderate diffuse atrophy. No abnormal attenuation to suggest acute vascular territory infarct. No hemorrhage or extra-axial fluid. No hydrocephalus.  IMPRESSION: Currently no evidence of acute abnormality although findings of anoxic injury may require up to 12 hr to show CT manifestation. Consider MRI or follow-up CT scan in approximately 12 hr to re-evaluate.   Electronically Signed   By: Esperanza Heiraymond  Rubner M.D.   On: 09/24/2014 21:27    ASSESSMENT AND PLAN 1. Ischemic heart disease with recent acute inferior wall myocardial infarction treated with drug-eluting stent to the right coronary artery. She has known residual disease in the proximal LAD and proximal AV circumflex. She returned after out of hospital cardiac arrest at home on 09/30/2014. Repeat cardiac catheterization on 09/24/2014 shows: 1. Severe stenosis of the LAD and left circumflex without significant change from the previous study 2. Widely patent RCA with no significant stenosis of the recently placed stent 3. Mild LV systolic dysfunction with inferior wall akinesis and an  estimated LVEF of 45-50%. There was no evidence of stent thrombosis. This may have been a VF arrest related to her previous inferior wall infarction with scar.  2.Hypertension 3.Status post R67.  Still unresponsive. 4. Frequent PVCs, resolved on IV amiodarone.  5. Acute kidney injury . 6. Hypokalemia. Repleted 7.  Anemia, improved since yesterday   Plan: Await decision from family regarding goals of care.  Will treat systolic hypertension with additional when necessary IV metoprolol  Signed, Cassell Clementhomas Leocadia Idleman MD

## 2014-09-18 NOTE — Progress Notes (Signed)
Met with family.  They are in agreement that that Mrs. Wiltse would not want to be sustained on life support w/o hope for meaningful recovery.  As such, they are in agreement to proceed with vent withdrawal and transition to comfort measures.  Process for vent withdrawal reviewed.  Explained that she might be transferred to palliative care room, depending on her status after extubation.    DNR order placed.  Chesley Mires, MD [facility] Pulmonary/Critical Care 09/18/2014, 11:43 AM Pager:  [phone] After 3pm call: [phone]

## 2014-09-18 NOTE — Progress Notes (Signed)
Pt. noted to have increased WOB and O2 Sats 81-83%.  Suctioned patient and notified Carrie,RRT and Dr. Craige CottaSood to come evaluated.  Will continue to monitor.

## 2014-09-18 NOTE — Progress Notes (Signed)
Called for desaturation by RN. On arrival, sats 80, 100% given to patient and suctioned. Patient sats only up to the low 90's on 100%. Patient very labored at this time. MD arrived and orders to keep patient on 100% at this time. RN aware.

## 2014-09-19 MED ORDER — MORPHINE SULFATE 25 MG/ML IV SOLN
10.0000 mg/h | INTRAVENOUS | Status: DC
  Administered 2014-09-19 (×2): 10 mg/h via INTRAVENOUS
  Filled 2014-09-19: qty 10

## 2014-09-19 MED ORDER — MORPHINE BOLUS VIA INFUSION
5.0000 mg | INTRAVENOUS | Status: DC | PRN
  Filled 2014-09-19: qty 20

## 2014-09-21 LAB — TYPE AND SCREEN
ABO/RH(D): A POS
Antibody Screen: NEGATIVE
UNIT DIVISION: 0

## 2014-09-22 ENCOUNTER — Ambulatory Visit: Payer: Medicare Other | Admitting: Internal Medicine

## 2014-09-25 ENCOUNTER — Other Ambulatory Visit: Payer: Self-pay | Admitting: Internal Medicine

## 2014-10-05 NOTE — Progress Notes (Signed)
Spoke with dtr Lanora ManisElizabeth to  Confirm aware of death and to get name of funeral home.Laurence Slate.  Angel aBarlow,rn

## 2014-10-05 NOTE — Progress Notes (Signed)
Pt s daughter and husband visited, spoke with chaplain. Family ready to withdraw,family didn't want to be present. Pt.extubated and morphine gtt initiated. Continue to monitor. Tammy SoursAngela Thales Knipple

## 2014-10-05 NOTE — Progress Notes (Signed)
PULMONARY / CRITICAL CARE MEDICINE   Name: [name] MRN: [id number] DOB: 05-27-1941    ADMISSION DATE:  09/22/2014 CONSULTATION DATE:  09/20/2014   REFERRING MD :  Dr. Excell Seltzer   CHIEF COMPLAINT:  Cardiac arrest   INITIAL PRESENTATION:  74 yo with VF arrest.  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  20 min before ROSC.  STUDIES:  1/09 CT head >> no acute findings 1/11 echo 50-55%, mild LVH, mild MVR 1/11 EEG no seizures cant r/o ictal source 1/12 CT head >> no acute findings  SIGNIFICANT EVENTS: 1/09 admit, hypothermia protocol  1/11 Twitching activity EEG abnl cant r/o ictal source but neg seizures. Neuro consulted  1/12 off pressors 1/14 Family meeting for GOC held. 1/15 family meeting with Dr. Craige Cotta, likely terminal extubation on 1/16.   SUBJECTIVE:  No events overnight, remains completely comatose.  VITAL SIGNS: Temp:  [97.9 F (36.6 C)-100.4 F (38 C)] 99.3 F (37.4 C) (01/16 0800) Pulse Rate:  [34-103] 75 (01/16 0742) Resp:  [13-27] 18 (01/16 0800) BP: (65-182)/(44-103) 86/51 mmHg (01/16 0800) SpO2:  [71 %-100 %] 100 % (01/16 0742) Arterial Line BP: (69-213)/(38-106) 112/51 mmHg (01/16 0800) FiO2 (%):  [40 %-100 %] 60 % (01/16 0742) Weight:  [67.8 kg (149 lb 7.6 oz)] 67.8 kg (149 lb 7.6 oz) (01/16 0300)  HEMODYNAMICS:    VENTILATOR SETTINGS: Vent Mode:  [-] PRVC FiO2 (%):  [40 %-100 %] 60 % Set Rate:  [18 bmp] 18 bmp Vt Set:  [490 mL] 490 mL PEEP:  [5 cmH20] 5 cmH20 Plateau Pressure:  [18 cmH20-25 cmH20] 21 cmH20  INTAKE / OUTPUT:  Intake/Output Summary (Last 24 hours) at Oct 11, 2014 0947 Last data filed at 10-11-14 0700  Gross per 24 hour  Intake 1762.4 ml  Output   2450 ml  Net -687.6 ml   PHYSICAL EXAMINATION: General: Unresponsive Neuro: PEARL, agitation HEENT: positive gag Cardiovascular: regular Lungs: CTA bilaterally. Wheezing bilaterally Abdomen: soft, non tender Musculoskeletal: 1+ edema Skin: no  rashes  LABS:  CBC  Recent Labs Lab 09/16/14 0800 09/17/14 0500 09/18/14 0429  WBC 11.9* 12.3* 10.4  HGB 7.4* 6.6* 7.8*  HCT 21.3* 19.2* 22.9*  PLT 246 206 278   Coag's  Recent Labs Lab 09/26/2014 1543  09/27/2014 1930 09/13/14 0315 09/13/14 1155 09/13/14 1935  APTT  --   < > 66* 135* 193* 67*  INR 1.39  --  1.50* 1.38  --   --   < > = values in this interval not displayed. BMET  Recent Labs Lab 09/16/14 0359 09/17/14 0500 09/18/14 0429  NA 134* 139 137  K 3.5 4.0 3.8  CL 104 105 103  CO2 BUN 23 22 26*  CREATININE 1.15* 1.12* 1.17*  GLUCOSE 182* 212* 252*   Electrolytes  Recent Labs Lab 09/26/2014 1543 09/18/2014 1930  09/14/14 0500 09/15/14 0430  09/16/14 0359 09/17/14 0500 09/18/14 0429  CALCIUM 8.3* 6.6*  < > 7.6* 8.1*  < > 7.6* 7.9* 8.0*  MG 2.2 1.6  --  1.5 1.5  --   --   --   --   PHOS 7.0*  --   --   --   --   --   --   --   --   < > = values in this interval not displayed. Sepsis Markers  Recent Labs Lab 09/10/2014 1619 09/17/14 0935  LATICACIDVEN 9.53* 1.9   ABG  Recent Labs  Lab 09/14/14 0750 09/15/14 1725 09/16/14 0346  PHART 7.304* 7.260* 7.459*  PCO2ART 35.2 37.0 31.2*  PO2ART 75.5* 81.0 97.2   Liver Enzymes  Recent Labs Lab 09/26/2014 1543 09/28/2014 1930 09/15/14 0430  AST 110* 179* 37  ALT 83* 143* 81*  ALKPHOS 89 104 81  BILITOT 0.5 0.8 0.5  ALBUMIN 2.3* 2.0* 2.1*   Cardiac Enzymes  Recent Labs Lab 10/02/2014 2355 09/13/14 0530 09/13/14 1155  TROPONINI 2.49* 2.42* 2.13*   Glucose  Recent Labs Lab 09/15/14 1720 09/15/14 2025 09/16/14 0030 09/16/14 0424 09/16/14 0757 09/16/14 1227  GLUCAP 149* 152* 150* 157* 154* 154*    Imaging No results found.   ASSESSMENT / PLAN:  PULMONARY OETT 1/9>> A:  Acute respiratory failure 2nd to cardiac arrest with pulmonary edema. Hx of COPD. P:   Full vent support for now  Terminal extubation later on today when family is ready.  CARDIOVASCULAR Rt IJ  CVL 1/09 A line 1/09  A:  VF/cardiac arrest with hx of CAD. Cardiogenic shock >> resolved. Hx of PAF. HTN with agitation P:  D/C medications for terminal extubation this AM.  RENAL A:   AKI 2nd to cardiac arrest. Non anion gap metabolic acidosis >> improved. P:   D/C further blood draws.  GASTROINTESTINAL A:   Nutrition. P:   D/C TF   HEMATOLOGIC A:   Anemia of critical illness. P:  No transfusion, D/C blood draws.  INFECTIOUS A:   No evidence for infection. P:   Monitor clinically.  ENDOCRINE A:   Hyperglycemia. H/o hypothyroidism. P:   D/C CBGs.  NEUROLOGIC A:   Acute encephalopathy with concern for anoxic encephalopathy, myoclonic seizures, and comatose. Agitation  P:   Morphine for comfort.  Daughter is coming in later on today and will withdraw care.  Will place morphine orders on the chart and once family is ready will start morphine and extubate.  The patient is critically ill with multiple organ systems failure and requires high complexity decision making for assessment and support, frequent evaluation and titration of therapies, application of advanced monitoring technologies and extensive interpretation of multiple databases.   Critical Care Time devoted to patient care services described in this note is  35  Minutes. This time reflects time of care of this signee Dr Koren BoundWesam Yacoub. This critical care time does not reflect procedure time, or teaching time or supervisory time of PA/NP/Med student/Med Resident etc but could involve care discussion time.  Alyson ReedyWesam G. Yacoub, M.D. [facility] Pulmonary/Critical Care Medicine. Pager: [phone]. After hours pager: [phone].

## 2014-10-05 NOTE — Progress Notes (Signed)
Pt expired at 1620. Confirmed asystole and absence of heart sounds for one minute via auscultation, by Melodye PedAngela Fusako Tanabe,RN and Cecilie LowersMorin Ande,RN. Niece at bedside and informed dtr Lanora ManisElizabeth. No belongings at bedside. Melodye PedAngela Clarice Zulauf,RN

## 2014-10-05 NOTE — Progress Notes (Signed)
Patient Name: [name] Date of Encounter: Oct 03, 2014     Active Problems:   Ventricular fibrillation   Cardiac arrest   Coronary artery disease involving native coronary artery of native heart without angina pectoris   Acute myocardial infarction of inferolateral wall, subsequent episode of care   Respiratory failure   Anoxic brain injury   Myoclonic jerking    SUBJECTIVE  The patient remains on ventilator.  This morning she is slightly more alert and opens her eyes.  She does not follow commands. Rhythm today is atrial flutter with controlled ventricular response.  CURRENT MEDS . sodium chloride   Intravenous Once  . antiseptic oral rinse  7 mL Mouth Rinse QID  . aspirin  81 mg Per Tube Daily  . chlorhexidine  15 mL Mouth Rinse BID  . clopidogrel  75 mg Per Tube Daily  . levETIRAcetam  1,000 mg Per Tube BID  . metoprolol tartrate  12.5 mg Per Tube BID  . pantoprazole sodium  40 mg Per Tube Q24H  . pravastatin  80 mg Per Tube q1800  . Valproic Acid  750 mg Oral BID    OBJECTIVE  Filed Vitals:   10/03/2014 0640 10-03-14 0646 03-Oct-2014 0700 10-03-2014 0742  BP:    129/92  Pulse: 89 86 87 75  Temp: 99.1 F (37.3 C) 99.1 F (37.3 C) 99.3 F (37.4 C)   TempSrc:   Core (Comment)   Resp: Height:      Weight:      SpO2: 71% 100% 100% 100%    Intake/Output Summary (Last 24 hours) at Oct 03, 2014 0835 Last data filed at 10/03/14 0700  Gross per 24 hour  Intake 1844.1 ml  Output   2450 ml  Net -605.9 ml   Filed Weights   09/17/14 0200 09/18/14 0457 10-03-2014 0300  Weight: 145 lb 15.1 oz (66.2 kg) 148 lb 9.4 oz (67.4 kg) 149 lb 7.6 oz (67.8 kg)    PHYSICAL EXAM  General: She opens her eyes but does not follow commands. HEENT:  Normal  Neck: Supple without bruits or JVD. Lungs:  Resp regular and unlabored, CTA. Heart: Atrial flutter with controlled ventricular response.  No murmur or gallop Abdomen: Soft, non-tender, non-distended, BS + x 4.    Extremities: No clubbing, cyanosis or edema. DP/PT/Radials 2+ and equal bilaterally.  Accessory Clinical Findings  CBC  Recent Labs  09/17/14 0500 09/18/14 0429  WBC 12.3* 10.4  HGB 6.6* 7.8*  HCT 19.2* 22.9*  MCV 85.0 87.7  PLT 206 278   Basic Metabolic Panel  Recent Labs  09/17/14 0500 09/18/14 0429  NA 139 137  K 4.0 3.8  CL 105 103  CO2 23 25  GLUCOSE 212* 252*  BUN 22 26*  CREATININE 1.12* 1.17*  CALCIUM 7.9* 8.0*   Liver Function Tests No results for input(s): AST, ALT, ALKPHOS, BILITOT, PROT, ALBUMIN in the last 72 hours. No results for input(s): LIPASE, AMYLASE in the last 72 hours. Cardiac Enzymes No results for input(s): CKTOTAL, CKMB, CKMBINDEX, TROPONINI in the last 72 hours. BNP Invalid input(s): POCBNP D-Dimer No results for input(s): DDIMER in the last 72 hours. Hemoglobin A1C No results for input(s): HGBA1C in the last 72 hours. Fasting Lipid Panel No results for input(s): CHOL, HDL, LDLCALC, TRIG, CHOLHDL, LDLDIRECT in the last 72 hours. Thyroid Function Tests No results for input(s): TSH, T4TOTAL, T3FREE, THYROIDAB in the last 72 hours.  Invalid input(s): FREET3  TELE  Atrial  flutter with controlled ventricular response  ECG    Radiology/Studies  Ct Head Wo Contrast  09/15/2014   CLINICAL DATA:  74 year old female with altered mental status and seizure today post hyperbaric chamber. Atrial fibrillation. Hyperlipidemia. Diabetes. Post cardiac arrest. Subsequent encounter.  EXAM: CT HEAD WITHOUT CONTRAST  TECHNIQUE: Contiguous axial images were obtained from the base of the skull through the vertex without intravenous contrast.  COMPARISON:  09/11/2014.  FINDINGS: No intracranial hemorrhage.  No CT evidence of large acute infarct or diffuse anoxia.  Global atrophy without hydrocephalus.  Vascular calcifications.  No intracranial mass lesion noted on this unenhanced exam.  Exophthalmos  IMPRESSION: No intracranial hemorrhage.  No CT  evidence of large acute infarct or diffuse anoxia.  Global atrophy without hydrocephalus.   Electronically Signed   By: Bridgett LarssonSteve  Olson M.D.   On: 09/15/2014 15:56   Dg Chest Port 1 View  09/17/2014   CLINICAL DATA:  Cardiac arrest, intubated  EXAM: PORTABLE CHEST - 1 VIEW  COMPARISON:  09/15/2014  FINDINGS: Endotracheal tube tip 12 mm above carina. Nasogastric tube remains in the decompressed stomach. Right IJ central line stable. Mild cardiomegaly as before. Bibasilar consolidation/ atelectasis. Hazy opacity at the right lung base suggesting layering effusion. Mild central pulmonary vascular congestion. Atheromatous aortic arch. No pneumothorax. Visualized skeletal structures are unremarkable.  IMPRESSION: 1. Low position of endotracheal tube, tip 12 mm above carina. 2. Little change in bibasilar atelectasis/consolidation, central pulmonary vascular congestion, and probable layering right effusion.   Electronically Signed   By: Oley Balmaniel  Hassell M.D.   On: 09/17/2014 08:15   Dg Chest Port 1 View  09/15/2014   CLINICAL DATA:  Evaluate endotracheal tube placement  EXAM: PORTABLE CHEST - 1 VIEW  COMPARISON:  09/13/2014 and prior.  FINDINGS: A right approach central venous catheter tip is in the region of the cavoatrial junction. An enteric tube is seen coursing towards the stomach. An endotracheal tube tip is at the level clavicles.  The cardiac silhouette is enlarged. The mediastinal contours are within normal limits given rotation. Atherosclerotic aortic calcifications are present.  Patchy areas of consolidation are noted in the lung bases. The costophrenic angles are slightly blunted. There is no pneumothorax. There is no overt pulmonary edema.  Degenerative changes of the spine and shoulders are present.  IMPRESSION: 1. Cardiomegaly without overt pulmonary edema. 2. Bibasilar consolidation, atelectasis versus pneumonitis. 3. Probable small bilateral pleural effusions.   Electronically Signed   By: Fannie KneeKenneth   Crosby   On: 09/15/2014 10:02   Dg Chest Port 1 View  09/13/2014   CLINICAL DATA:  Respiratory failure  EXAM: PORTABLE CHEST - 1 VIEW  COMPARISON:  September 12, 2014  FINDINGS: Endotracheal tube tip is 5.4 cm above the carina. Nasogastric tube tip and side port are in the stomach. Central catheter tip is in the superior vena cava. No pneumothorax. There is underlying emphysema. There is significantly less interstitial edema compared to 1 day prior. There is no consolidation or volume loss. The heart is upper normal in size with pulmonary vascularity within normal limits. No adenopathy.  IMPRESSION: Considerably less interstitial edema compared to 1 day prior. No new opacity. Underlying emphysema. No change in cardiac silhouette. Tube and catheter positions as described without pneumothorax.   Electronically Signed   By: Bretta BangWilliam  Woodruff M.D.   On: 09/13/2014 15:19   Dg Chest Port 1 View  09/10/2014   CLINICAL DATA:  Central line evaluation.  Intubated patient.  EXAM: PORTABLE CHEST - 1  VIEW  COMPARISON:  Radiograph 09-23-14  FINDINGS: Endotracheal tube and NG tube are unchanged. Interval placement of a right central venous line with tip in the distal SVC. No pneumothorax.  Stable cardiac silhouette. There is diffuse bilateral airspace disease more dense on the right. No pleural fluid.  IMPRESSION: 1. Interval placement right central venous line without complication. 2. Diffuse airspace disease suggesting pulmonary edema, not changed from prior. 3. Other support apparatus stable.   Electronically Signed   By: Genevive Bi M.D.   On: 23-Sep-2014 19:31   Dg Chest Portable 1 View  2014/09/23   CLINICAL DATA:  Patient status post CPR.  History of COPD.  EXAM: PORTABLE CHEST - 1 VIEW  COMPARISON:  09/08/2014  FINDINGS: ET tube terminates in the mid trachea. Enteric tube courses inferior to the diaphragm. Patient is rotated. Tortuosity of the thoracic aorta. Stable cardiac contours. Diffuse bilateral  heterogeneous pulmonary opacities. Multiple irregular lucencies projecting over the left upper and lower hemi thorax.  IMPRESSION: Multiple lucencies projecting over the left hemithorax are nonspecific on portable chest radiograph. While these may be secondary to skin folds, pneumothorax is not excluded. Recommend attention on repeat radiograph.  Diffuse bilateral heterogeneous pulmonary opacities, favored to represent edema.  These results will be called to the ordering clinician or representative by the Radiologist Assistant, and communication documented in the PACS or zVision Dashboard.   Electronically Signed   By: Annia Belt M.D.   On: Sep 23, 2014 16:53   Dg Chest Port 1 View  09/08/2014   CLINICAL DATA:  Temporary pacer.  EXAM: PORTABLE CHEST - 1 VIEW  COMPARISON:  09/07/2014.  FINDINGS: Mediastinum hilar structures are normal. Tapering pacer noted projected over right ventricle. Stable cardiomegaly. Pulmonary vascularity normal. Low lung volumes with mild basilar atelectasis. No pleural effusion or pneumothorax.  IMPRESSION: 1. Temporary pacer noted in stable position projected over the right ventricle. Stable cardiomegaly. No CHF. 2. Low lung volumes with mild basilar atelectasis .   Electronically Signed   By: Maisie Fus  Register   On: 09/08/2014 07:28   Dg Chest Port 1 View  09/07/2014   CLINICAL DATA:  Left posterior chest discomfort. Temporary pacemaker.  EXAM: PORTABLE CHEST - 1 VIEW  COMPARISON:  09/05/2014  FINDINGS: Temporary pacing lead projects in the right ventricle, unchanged.  Cardiac silhouette is mildly enlarged. No mediastinal or hilar masses or evidence of adenopathy.  Clear lungs.  No pleural effusion or pneumothorax.  Bony thorax is intact.  IMPRESSION: 1. No acute cardiopulmonary disease. No change from the prior study.   Electronically Signed   By: Amie Portland M.D.   On: 09/07/2014 09:40   Dg Chest Portable 1 View  09/05/2014   CLINICAL DATA:  Acute myocardial infarction.  EXAM:  PORTABLE CHEST - 1 VIEW  COMPARISON:  04/19/2013  FINDINGS: Heart size and pulmonary vascularity are within normal limits in the lungs are clear. No acute osseous abnormality. Slight tortuosity and calcification of the thoracic aorta. Temporary pacing lead in place.  IMPRESSION: Temporary pacing lead in place.  No acute abnormalities.   Electronically Signed   By: Geanie Cooley M.D.   On: 09/05/2014 19:01   Ct Portable Head W/o Cm  09-23-14   CLINICAL DATA:  Initial evaluation for cardiac arrest and CPR for 20 min, concern for acute hypoxic encephalopathy  EXAM: CT HEAD WITHOUT CONTRAST  TECHNIQUE: Contiguous axial images were obtained from the base of the skull through the vertex without intravenous contrast.  COMPARISON:  None.  FINDINGS: Endotracheal tube and orogastric tube noted.  Moderate diffuse atrophy. No abnormal attenuation to suggest acute vascular territory infarct. No hemorrhage or extra-axial fluid. No hydrocephalus.  IMPRESSION: Currently no evidence of acute abnormality although findings of anoxic injury may require up to 12 hr to show CT manifestation. Consider MRI or follow-up CT scan in approximately 12 hr to re-evaluate.   Electronically Signed   By: Esperanza Heir M.D.   On: 09/10/2014 21:27    ASSESSMENT AND PLAN 1. Ischemic heart disease with recent acute inferior wall myocardial infarction treated with drug-eluting stent to the right coronary artery. She has known residual disease in the proximal LAD and proximal AV circumflex. She returned after out of hospital cardiac arrest at home on 09/18/2014. Repeat cardiac catheterization on 09/11/2014 shows: 1. Severe stenosis of the LAD and left circumflex without significant change from the previous study 2. Widely patent RCA with no significant stenosis of the recently placed stent 3. Mild LV systolic dysfunction with inferior wall akinesis and an estimated LVEF of 45-50%. There was no evidence of stent thrombosis. This may have been a  VF arrest related to her previous inferior wall infarction with scar.  2.Hypertension 3.Status post resuscitation.. Still unresponsive.  No purposeful movements although she opens her eyes 4. Atrial flutter with controlled ventricular response.  Into U IV amiodarone  5. Acute kidney injury . 6. Hypokalemia. Repleted 7. Anemia, improved since yesterday   Plan: The family is still trying to make a decision about withdrawal of ventilatory support according to the nurse.  Family not present at present time.  Signed, Cassell Clement MD

## 2014-10-05 NOTE — Progress Notes (Signed)
200 cc fentanyl wasted in sink with witness MOrin Ande,RN.  Tammy SoursAngela Kain Milosevic

## 2014-10-05 NOTE — Progress Notes (Signed)
   04-25-15 1100  Clinical Encounter Type  Visited With Patient and family together  Visit Type Initial;Spiritual support;Social support;Critical Care;Patient actively dying  Referral From Physician  Spiritual Encounters  Spiritual Needs Grief support  Stress Factors  Family Stress Factors Loss   Chaplain was paged to the patient's room at 11:02 AM. Chaplain was notified that life-prolonging support was being withdrawn from the patient today per family's decision. When chaplain arriived patient was being visited by her husband, daughter, and son-in-law. Patient's daughter was very confident in her decision that her mother would not want to continue suffering and having her life prolonged in this situation. Patient's daughter explained that while she has been confident in this decision she wanted the patient's husband to be able to visit her and come to that conclusion himself today. Patient's husband was having a difficult time but also did not want the patient suffering. Patient's husband has experienced his wife's declining health as a shock. 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 [facility]. Patient's family has accepted the condition of patient and have accepted that she is actively dying. Patient's family did not wish to be present at the hospital anymore today. Chaplain will continue to provide emotional and spiritual support for patient and patient's family as needed.  Cranston NeighborStrother, Leydi Winstead R, Chaplain  11:34 AM

## 2014-10-05 DEATH — deceased

## 2014-10-15 NOTE — Discharge Summary (Addendum)
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 Her initial rhythm was ventricular fibrillation. She was defibrillated in the field by EMS, but while her arrest was witnessed, she was 'down' without CPR prior to EMS arrival. After return of spontaneous circulation, she was brought emergently to the cardiac catheterization lab. Cardiac catheterization demonstrated patency of her recent RCA stent. There was stable stenosis of the LAD and left circumflex. The patient was treated via the hypothermia protocol. Despite aggressive supportive measures, she had irreversible anoxic brain injury and never regained consciousness. On January 16 she underwent terminal extubation and expired at 1620.  Tonny BollmanMichael Marcell Chavarin 10/15/2014 5:57 PM

## 2014-11-17 ENCOUNTER — Other Ambulatory Visit: Payer: Medicare Other

## 2015-01-25 ENCOUNTER — Ambulatory Visit: Payer: Medicare Other | Admitting: Family Medicine

## 2016-04-16 IMAGING — CR DG CHEST 1V PORT
1 series · 1 of 1 positions shown · non-contrast
Comparison: 09/05/2014

CLINICAL DATA: Left posterior chest discomfort. Temporary
pacemaker.

EXAM:
PORTABLE CHEST - 1 VIEW

[AP]
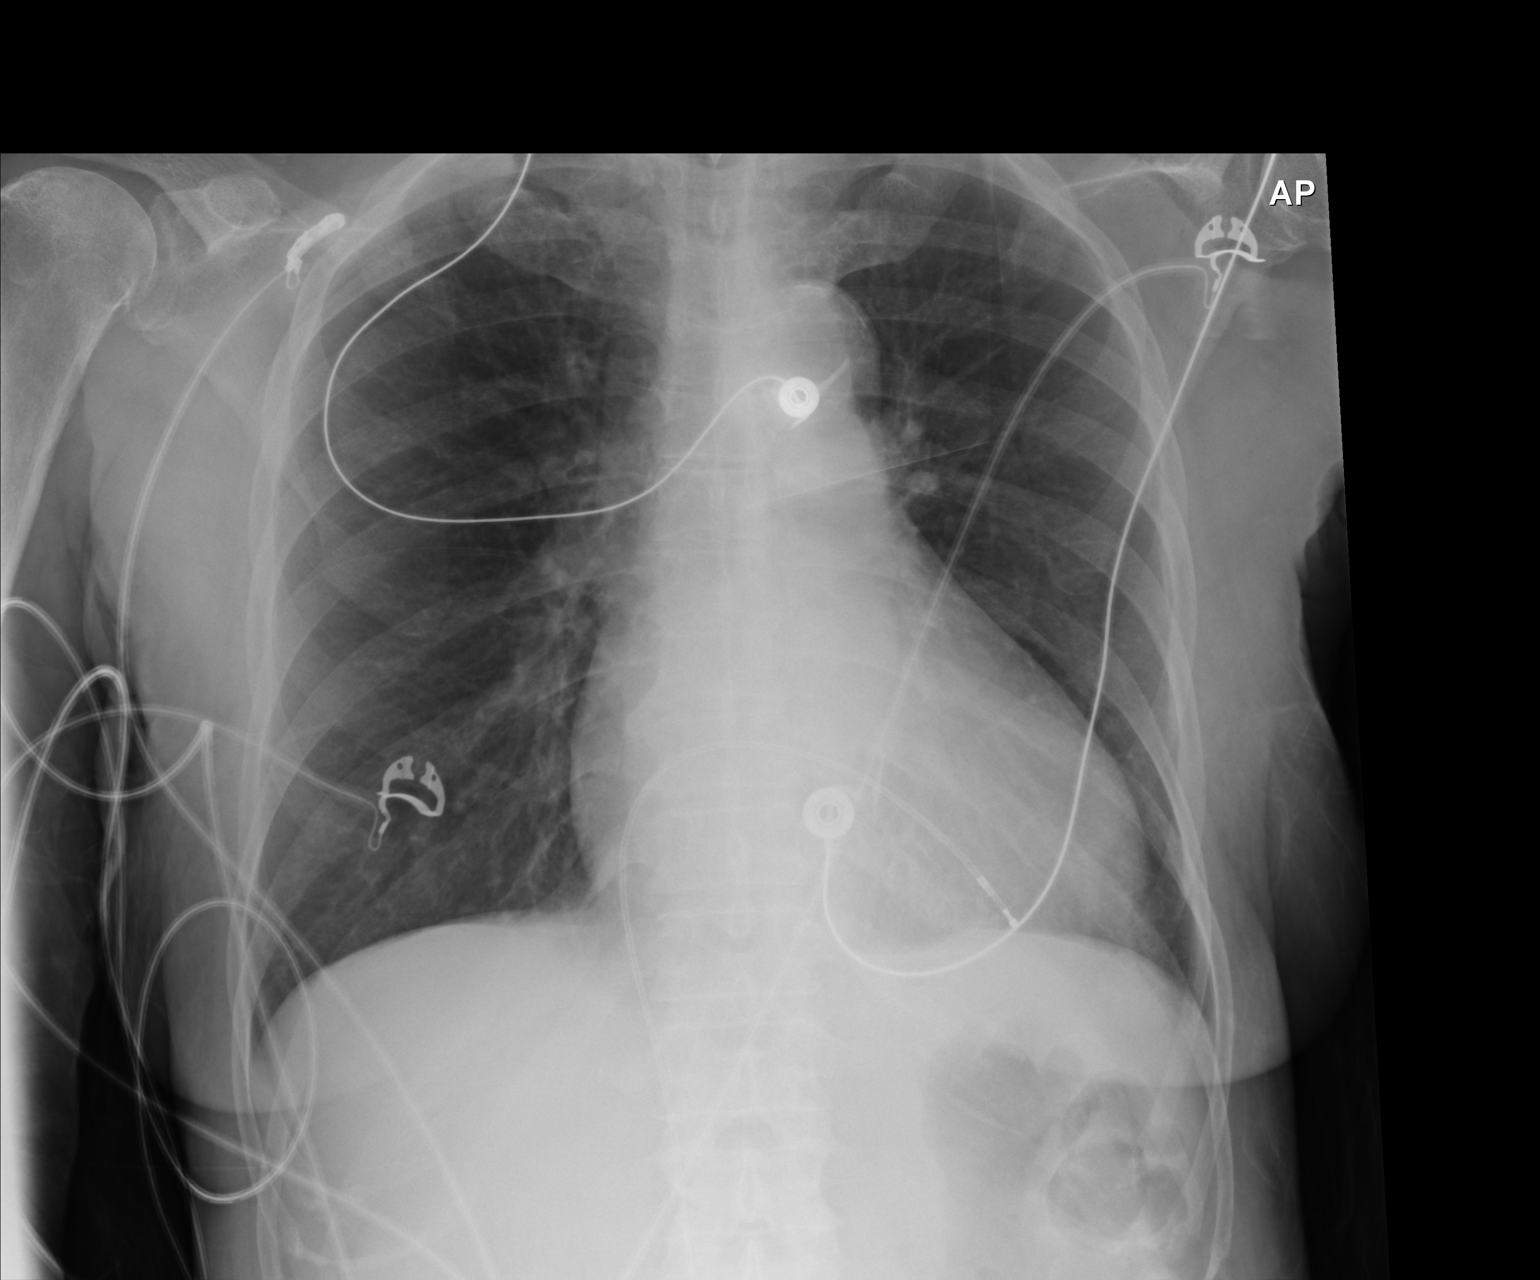

[1 of 1 positions shown; findings below may reference images not displayed]

FINDINGS: Temporary pacing lead projects in the right ventricle, unchanged.

Cardiac silhouette is mildly enlarged. No mediastinal or hilar
masses or evidence of adenopathy.

Clear lungs.  No pleural effusion or pneumothorax.

Bony thorax is intact.
IMPRESSION: 1. No acute cardiopulmonary disease. No change from the prior study.

## 2016-04-21 IMAGING — CR DG CHEST 1V PORT
1 series · 1 of 1 positions shown · non-contrast
Comparison: Radiograph 09/12/2014

CLINICAL DATA: Central line evaluation.  Intubated patient.

EXAM:
PORTABLE CHEST - 1 VIEW

[AP]
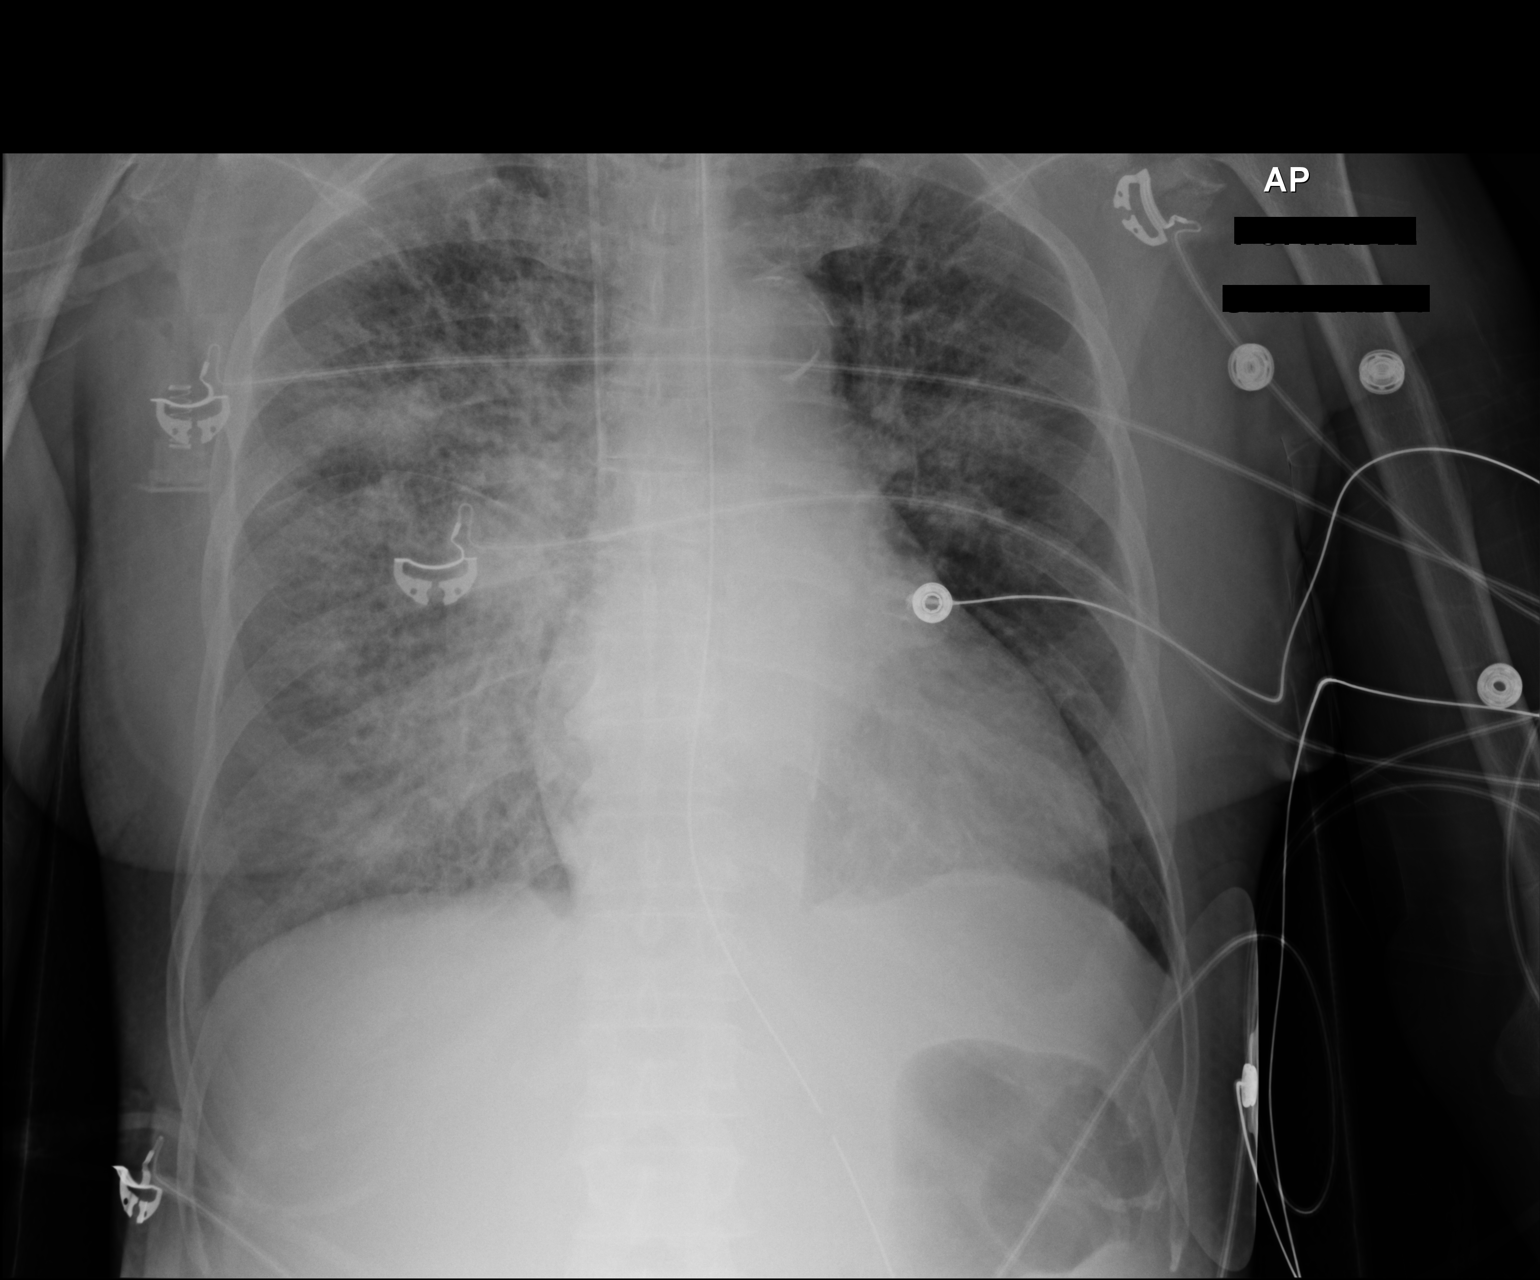

[1 of 1 positions shown; findings below may reference images not displayed]

FINDINGS: Endotracheal tube and NG tube are unchanged. Interval placement of a
right central venous line with tip in the distal SVC. No
pneumothorax.

Stable cardiac silhouette. There is diffuse bilateral airspace
disease more dense on the right. No pleural fluid.
IMPRESSION: 1. Interval placement right central venous line without
complication.
2. Diffuse airspace disease suggesting pulmonary edema, not changed
from prior.
3. Other support apparatus stable.

## 2016-04-21 IMAGING — CT CT HEAD W/O CM
1 of 2 series · 13 of 30 positions shown, 17 images · non-contrast
Comparison: None.

CLINICAL DATA: Initial evaluation for cardiac arrest and CPR for 20
min, concern for acute hypoxic encephalopathy

EXAM:
CT HEAD WITHOUT CONTRAST
TECHNIQUE: Contiguous axial images were obtained from the base of the skull
through the vertex without intravenous contrast.

[Series 1: — · axial · 0.49mm/px · z∈[-273,-113]mm · 13 of 38 slices shown, 17 images]
[im 3/38  brain]
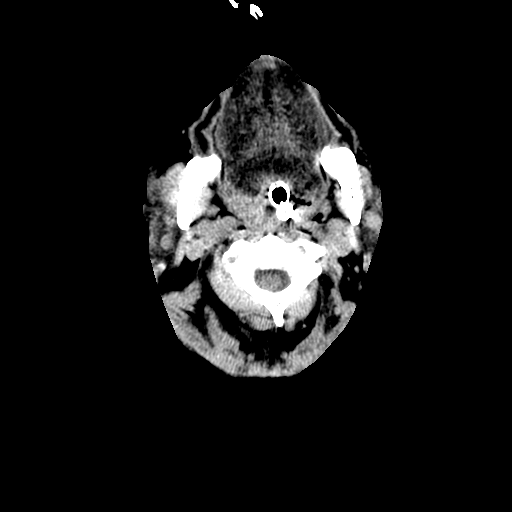
[im 3/38  bone]
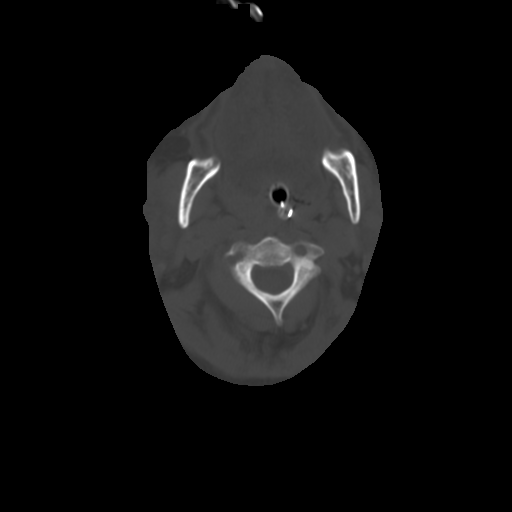
[im 6/38  brain]
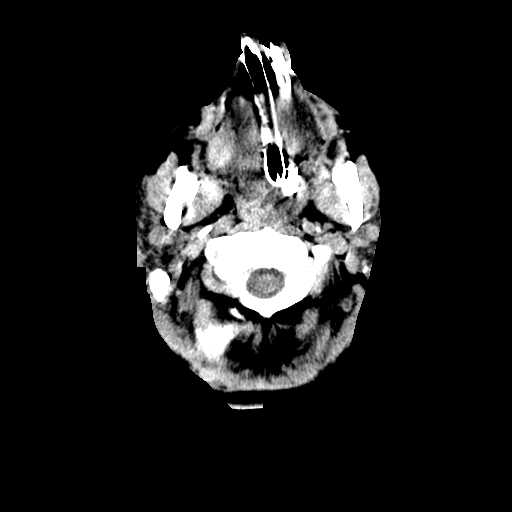
[im 8/38  brain]
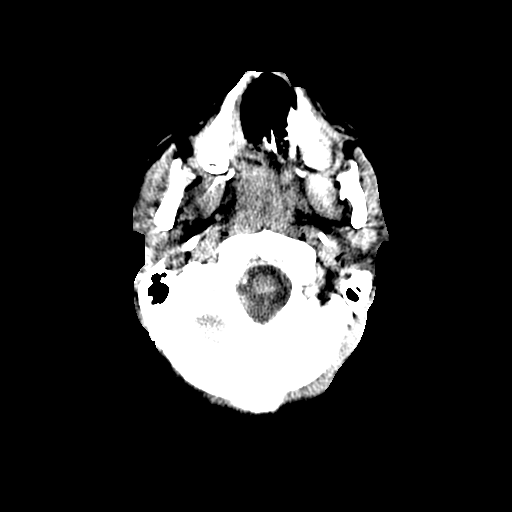
[im 11/38  brain]
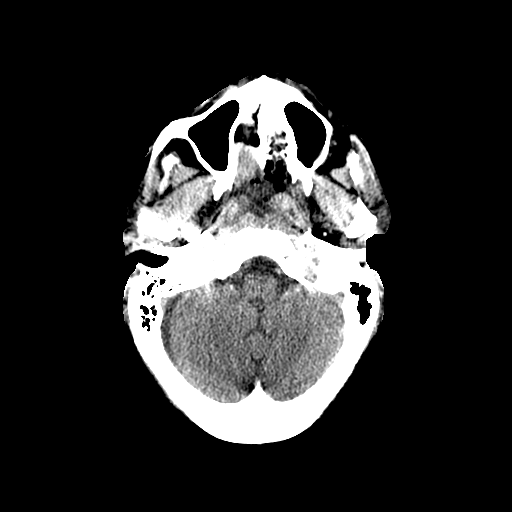
[im 14/38  brain]
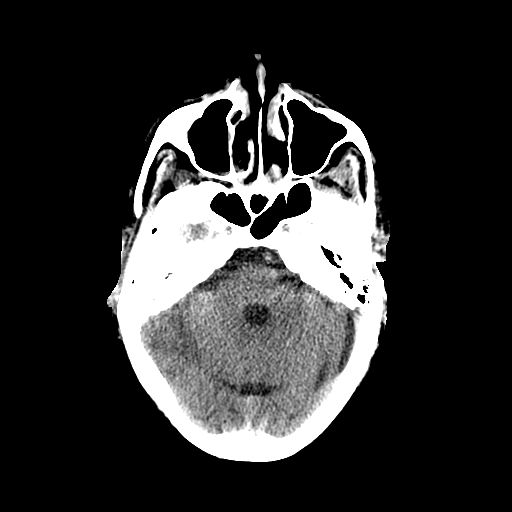
[im 14/38  bone]
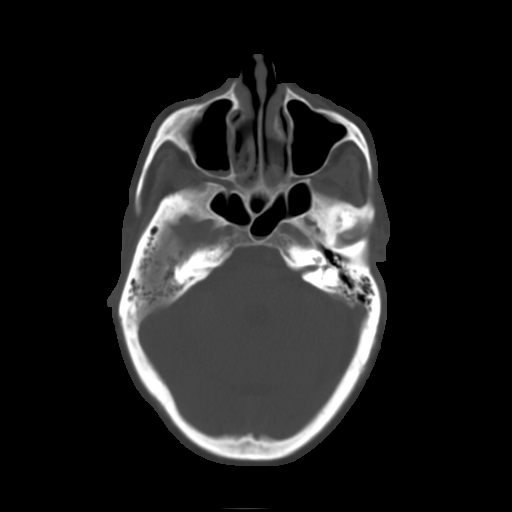
[im 16/38  brain]
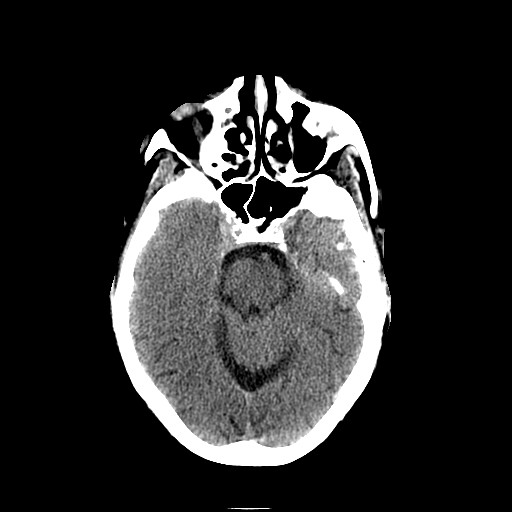
[im 19/38  brain]
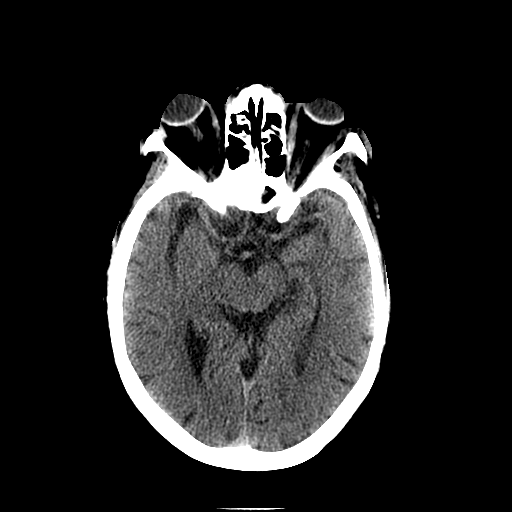
[im 22/38  brain]
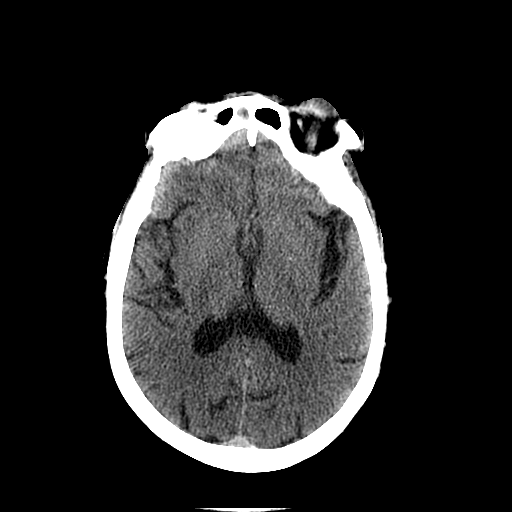
[im 24/38  brain]
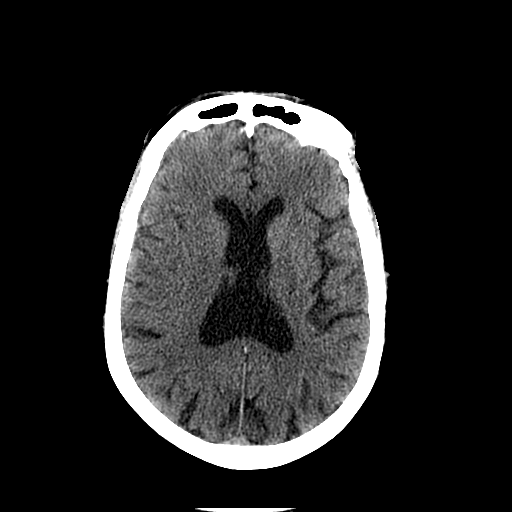
[im 24/38  bone]
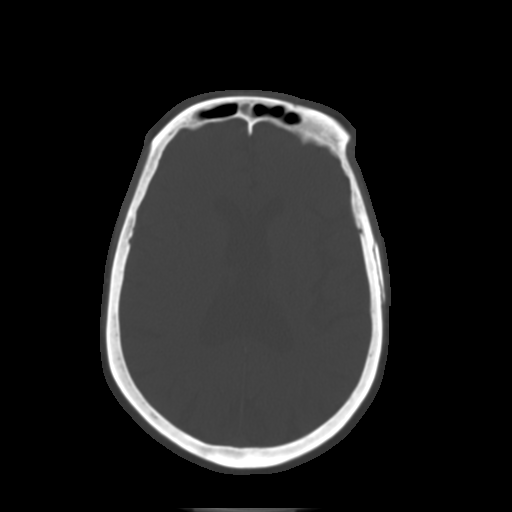
[im 27/38  brain]
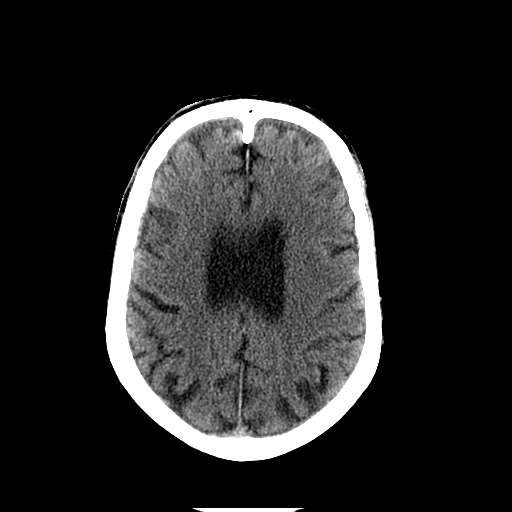
[im 30/38  brain]
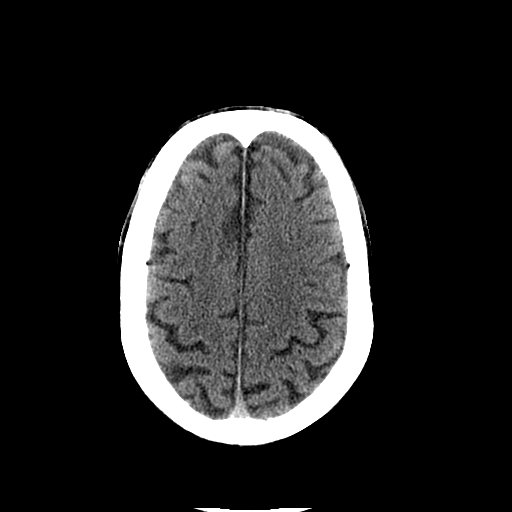
[im 32/38  brain]
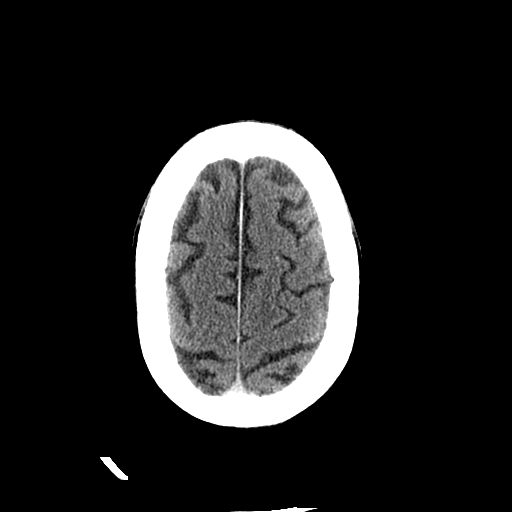
[im 35/38  brain]
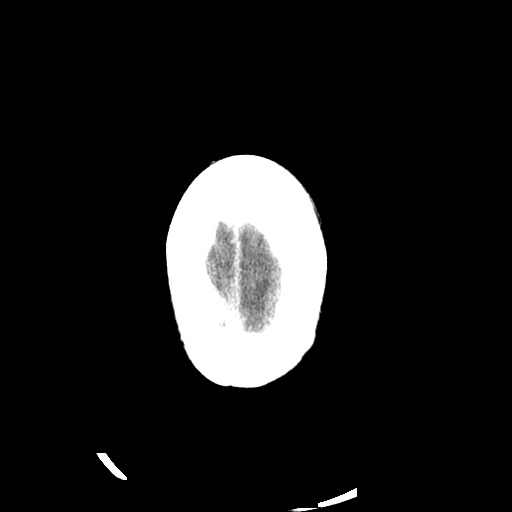
[im 35/38  bone]
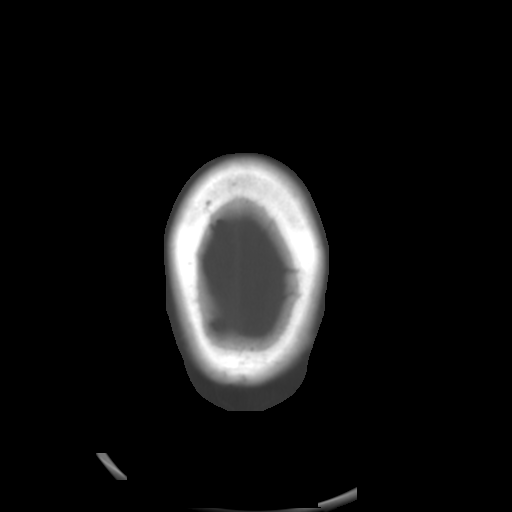

[13 of 30 positions shown; findings below may reference images not displayed]

FINDINGS: Endotracheal tube and orogastric tube noted.

Moderate diffuse atrophy. No abnormal attenuation to suggest acute
vascular territory infarct. No hemorrhage or extra-axial fluid. No
hydrocephalus.
IMPRESSION: Currently no evidence of acute abnormality although findings of
anoxic injury may require up to 12 hr to show CT manifestation.
Consider MRI or follow-up CT scan in approximately 12 hr to
re-evaluate.
